# Patient Record
Sex: Male | Born: 1985 | Race: White | Hispanic: No | Marital: Single | Smoking: Current every day smoker
Health system: Southern US, Community
[De-identification: ages and names within clinical notes are randomized; demographics above are authoritative.]

## PROBLEM LIST (undated history)

## (undated) DIAGNOSIS — F1910 Other psychoactive substance abuse, uncomplicated: Secondary | ICD-10-CM

## (undated) DIAGNOSIS — R42 Dizziness and giddiness: Secondary | ICD-10-CM

## (undated) DIAGNOSIS — K859 Acute pancreatitis without necrosis or infection, unspecified: Secondary | ICD-10-CM

## (undated) DIAGNOSIS — I10 Essential (primary) hypertension: Secondary | ICD-10-CM

## (undated) DIAGNOSIS — F419 Anxiety disorder, unspecified: Secondary | ICD-10-CM

## (undated) DIAGNOSIS — E785 Hyperlipidemia, unspecified: Secondary | ICD-10-CM

## (undated) DIAGNOSIS — IMO0002 Reserved for concepts with insufficient information to code with codable children: Secondary | ICD-10-CM

## (undated) DIAGNOSIS — K219 Gastro-esophageal reflux disease without esophagitis: Secondary | ICD-10-CM

## (undated) HISTORY — DX: Hyperlipidemia, unspecified: E78.5

## (undated) HISTORY — DX: Dizziness and giddiness: R42

## (undated) HISTORY — DX: Other psychoactive substance abuse, uncomplicated: F19.10

## (undated) HISTORY — DX: Anxiety disorder, unspecified: F41.9

## (undated) HISTORY — DX: Gastro-esophageal reflux disease without esophagitis: K21.9

## (undated) HISTORY — PX: APPENDECTOMY: SHX54

---

## 2013-04-03 ENCOUNTER — Emergency Department (HOSPITAL_COMMUNITY)
Admission: EM | Admit: 2013-04-03 | Discharge: 2013-04-03 | Disposition: A | Discharge status: Home / Self Care | Payer: Self-pay | Attending: Emergency Medicine | Admitting: Emergency Medicine

## 2013-04-03 ENCOUNTER — Emergency Department (HOSPITAL_COMMUNITY): Payer: Self-pay

## 2013-04-03 ENCOUNTER — Encounter (HOSPITAL_COMMUNITY): Payer: Self-pay | Admitting: *Deleted

## 2013-04-03 DIAGNOSIS — R928 Other abnormal and inconclusive findings on diagnostic imaging of breast: Secondary | ICD-10-CM | POA: Insufficient documentation

## 2013-04-03 DIAGNOSIS — Z79899 Other long term (current) drug therapy: Secondary | ICD-10-CM | POA: Insufficient documentation

## 2013-04-03 DIAGNOSIS — Z8679 Personal history of other diseases of the circulatory system: Secondary | ICD-10-CM | POA: Insufficient documentation

## 2013-04-03 DIAGNOSIS — IMO0002 Reserved for concepts with insufficient information to code with codable children: Secondary | ICD-10-CM | POA: Insufficient documentation

## 2013-04-03 DIAGNOSIS — R51 Headache: Secondary | ICD-10-CM | POA: Insufficient documentation

## 2013-04-03 DIAGNOSIS — H53149 Visual discomfort, unspecified: Secondary | ICD-10-CM | POA: Insufficient documentation

## 2013-04-03 DIAGNOSIS — Y929 Unspecified place or not applicable: Secondary | ICD-10-CM | POA: Insufficient documentation

## 2013-04-03 DIAGNOSIS — Y939 Activity, unspecified: Secondary | ICD-10-CM | POA: Insufficient documentation

## 2013-04-03 DIAGNOSIS — L0231 Cutaneous abscess of buttock: Secondary | ICD-10-CM | POA: Insufficient documentation

## 2013-04-03 DIAGNOSIS — W268XXA Contact with other sharp object(s), not elsewhere classified, initial encounter: Secondary | ICD-10-CM | POA: Insufficient documentation

## 2013-04-03 DIAGNOSIS — R112 Nausea with vomiting, unspecified: Secondary | ICD-10-CM | POA: Insufficient documentation

## 2013-04-03 DIAGNOSIS — I10 Essential (primary) hypertension: Secondary | ICD-10-CM | POA: Insufficient documentation

## 2013-04-03 DIAGNOSIS — F17200 Nicotine dependence, unspecified, uncomplicated: Secondary | ICD-10-CM | POA: Insufficient documentation

## 2013-04-03 HISTORY — DX: Essential (primary) hypertension: I10

## 2013-04-03 MED ORDER — KETOROLAC TROMETHAMINE 60 MG/2ML IM SOLN
60.0000 mg | Freq: Once | INTRAMUSCULAR | Status: AC
  Administered 2013-04-03: 60 mg via INTRAMUSCULAR
  Filled 2013-04-03: qty 2

## 2013-04-03 MED ORDER — SODIUM CHLORIDE 0.9 % IV BOLUS (SEPSIS): 1000.0000 mL | Freq: Once | INTRAVENOUS | Status: DC

## 2013-04-03 MED ORDER — METOCLOPRAMIDE HCL 5 MG/ML IJ SOLN
10.0000 mg | Freq: Once | INTRAMUSCULAR | Status: DC
  Filled 2013-04-03: qty 2

## 2013-04-03 MED ORDER — DIPHENHYDRAMINE HCL 50 MG/ML IJ SOLN
INTRAMUSCULAR | Status: AC
  Filled 2013-04-03: qty 1

## 2013-04-03 MED ORDER — IBUPROFEN 800 MG PO TABS: 800.0000 mg | ORAL_TABLET | Freq: Three times a day (TID) | ORAL | Status: DC | PRN

## 2013-04-03 MED ORDER — DIPHENHYDRAMINE HCL 50 MG/ML IJ SOLN
25.0000 mg | Freq: Once | INTRAMUSCULAR | Status: AC
  Administered 2013-04-03: 25 mg via INTRAMUSCULAR

## 2013-04-03 MED ORDER — SUMATRIPTAN SUCCINATE 50 MG PO TABS: 50.0000 mg | ORAL_TABLET | ORAL | Status: DC | PRN

## 2013-04-03 MED ORDER — DIPHENHYDRAMINE HCL 50 MG/ML IJ SOLN
25.0000 mg | Freq: Once | INTRAMUSCULAR | Status: DC
  Filled 2013-04-03: qty 1

## 2013-04-03 MED ORDER — KETOROLAC TROMETHAMINE 30 MG/ML IJ SOLN
30.0000 mg | Freq: Once | INTRAMUSCULAR | Status: DC
  Filled 2013-04-03: qty 1

## 2013-04-03 MED ORDER — PROCHLORPERAZINE EDISYLATE 5 MG/ML IJ SOLN
10.0000 mg | Freq: Once | INTRAMUSCULAR | Status: AC
  Administered 2013-04-03: 10 mg via INTRAMUSCULAR
  Filled 2013-04-03: qty 2

## 2013-04-03 NOTE — ED Notes (Signed)
Pt is here with consistent headache for 2 weeks and runs from left eye to back of head.  BP 170/120 and family started giving him her blood pressure medicine and has lowered it.  Pt has lump to right peck area that is growing and hurts.  Sensitive to light

## 2013-04-03 NOTE — ED Notes (Signed)
Patient is alert and orientedx4.  Patient was explained discharge instructions and they understood them with no questions.  The patient's mom, Richard House is taking the patient home.

## 2013-04-03 NOTE — ED Provider Notes (Signed)
I saw and evaluated the patient, reviewed the resident's note and I agree with the findings and plan.  Please see my separate note regarding my evaluation of the patient.  Clinical Impression:  headache   Vida Roller, MD 04/03/13 [phone]

## 2013-04-03 NOTE — ED Provider Notes (Signed)
CSN: [id number]     Arrival date & time 04/03/13  1048 History     First MD Initiated Contact with Patient 04/03/13 1115     Chief Complaint  Patient presents with  . Headache   (Consider location/radiation/quality/duration/timing/severity/associated sxs/prior Treatment) Patient is a 27 y.o. male presenting with headaches.  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  He presents to the ED today for a headache that has been ongoing for the past 2 weeks.  He reports currently his HA is currently located behind his left eye and it radiates to the back of his head.  He reports before this morning he had a B/L occipital headache.  He reports his headache is currently a 7-8/10.  He admits a history of migraine HA.  He reports associated nausea and one episode of NBNB emesis this morning.  He also reports photophobia and phonophobia. He is accompanied by his mother who reports that he has been in [place] for the past month to try get into a methadone detox program.  He has started to decrease his methadone use in the past 2 weeks.  In addition his mother has been monitoring his BP, she reports that 2 weeks ago she noticed that his BP was 170/120 and started to give him her BP medication Lotrel 5/10 in the morning and at night.  She has continued to monitor his BP.  He reports he does not have a PCP or healthcare provider. He is also concerned about nodule under left breast, recurrent cyst that drains occasionally, and cyst on tailbone that also occasionally drains.  Past Medical History  Diagnosis Date  . Hypertension    History reviewed. No  pertinent past surgical history. No family history on file. History  Substance Use Topics  . Smoking status: Current Every Day Smoker  . Smokeless tobacco: Not on file  . Alcohol Use: No    Review of Systems  Constitutional: Negative for fever, chills and activity change.  HENT: Negative for congestion, neck pain, neck stiffness and sinus pressure.   Eyes: Positive for photophobia. Negative for pain and visual disturbance.  Respiratory: Negative for cough, chest tightness and shortness of breath.   Cardiovascular: Negative for chest pain, palpitations and leg swelling.  Gastrointestinal: Positive for nausea and vomiting. Negative for abdominal pain, diarrhea and constipation.  Endocrine: Negative for polydipsia and polyuria.  Genitourinary: Negative for dysuria, frequency, hematuria and difficulty urinating.  Musculoskeletal: Negative for myalgias, back pain and arthralgias.  Skin: Positive for wound (scratch on right chest).  Neurological: Positive for headaches. Negative for dizziness, seizures, facial asymmetry, speech difficulty, weakness, light-headedness and numbness.  Psychiatric/Behavioral: Negative for confusion.    Allergies  Review of patient's allergies indicates no known allergies.  Home Medications   Current Outpatient Rx  Name  Route  Sig  Dispense  Refill  . amLODipine-benazepril (LOTREL) 5-10 MG per capsule   Oral   Take 1 capsule by mouth daily.         . methadone (DOLOPHINE) 10 MG/ML solution   Oral   Take 90 mg by mouth daily.         Marland Kitchen  Multiple Vitamin (MULTIVITAMIN WITH MINERALS) TABS tablet   Oral   Take 1 tablet by mouth daily.          BP 112/81  Pulse 84  Temp(Src) 98.4 F (36.9 C) (Oral)  Resp 18  SpO2 97% Physical Exam  Nursing note and vitals reviewed. Constitutional: He is oriented to person, place, and time. He appears well-developed and well-nourished. No distress.  HENT:  Head: Normocephalic and atraumatic.  Eyes: EOM are  normal.  Neck: Normal range of motion. Neck supple.  Cardiovascular: Normal rate, regular rhythm and normal heart sounds.   No murmur heard. Pulmonary/Chest: Effort normal and breath sounds normal. No respiratory distress. He has no wheezes. He has no rales. He exhibits tenderness (right nipple over 1cm cyst).  1cm tender nodule under left nipple, no surrounding erythema.  Abdominal: Soft. Bowel sounds are normal. He exhibits no distension. There is no tenderness. There is no rebound.  Musculoskeletal: He exhibits no edema.  Neurological: He is alert and oriented to person, place, and time. No cranial nerve deficit.  Skin: He is not diaphoretic.  Small <1cm cyst in left groin, not draining. Small tract present at base of sacrum, no cyst palpated, no drainage noted.    ED Course   Procedures (including critical care time)  Labs Reviewed - No data to display Ct Head Wo Contrast  04/03/2013   *RADIOLOGY REPORT*  Clinical Data: Persistent headaches with nausea and photophobia.  CT HEAD WITHOUT CONTRAST  Technique:  Contiguous axial images were obtained from the base of the skull through the vertex without contrast.  Comparison: None.  Findings: No evidence of acute infarct, acute hemorrhage, mass lesion, mass effect or hydrocephalus.  Visualized portions of the paranasal sinuses and mastoid air cells are clear.  IMPRESSION: Negative.   Original Report Authenticated By: Leanna Battles, M.D.   1. Headache     MDM  27 year old male with PMH of heroine abuse currently dextoxing from methadone who may have history of longstanding HTN, that was previous untreated.  Patient has experienced 2 weeks of HA that are consistent with migraine type HA, he has no nuerologic deficits, and his HA have conincided with metadone withdraw and also sudden addition of BP medication from his mother.   He does not describe these HA as sudden onset, not worst HA of his life, and have not gradually increased over  months.  He reports no neck stiffniss or pain, has no fever, I do not believe a LP is warranted at this time. Will treat symptoms with IV Toradol, reglan, and benadryl, 1L NS bolus and reevaluate. CT-head negative for acute process. Patient feels much better after medication.  Will discharge with prescriptions for Ibuprofen and Imitrex for likely migraine HA.  Patient instructed to follow up with Neurology.  Patient instructed to follow up with General Surgery for cyst, instructed to follow up with [facility] for tender nodule under left breast.  Given return precautions.  Given resource guide for finding PCP, told not to take mother's BP medication and should establish with PCP within the next few days to establish care and evaluate for HTN.  [name], DO 04/03/13 1457

## 2013-04-03 NOTE — ED Notes (Signed)
[facility] card application was given to establish Primary Care.

## 2013-04-03 NOTE — ED Provider Notes (Addendum)
27 year old male with a history of headaches intermittently but gradually worsening over the last several weeks. He is currently on methadone taper because of his chronic heroin use. He has been on methadone for the last year and a half successfully, has tapered from 95 mg a day to 85 mg a day. He does not feel like he is having any other withdrawal symptoms. The headaches are throbbing, left-sided, right-sided, not associated with focal neuro deficits fevers or stiff neck. On exam he has a normal neurologic exam including a clear sensorium, normal cranial nerves III through 12, normal gait, normal speech. He has normal pupillary exam, no sinus tenderness clear oropharynx. He does not have reproducible tenderness on exam. The patient likely has a migraine-type headache, he can be treated with medications in a cocktail format including Compazine, Toradol, Benadryl if available. He is amenable to followup. He also has a incidental mass in his right breast which has not been evaluated and for which she will need referral to the breast center.   Clinical Impression:   Headache Breast mass  Vida Roller, MD 04/03/13 1242  Vida Roller, MD 04/03/13 [phone]

## 2013-04-04 ENCOUNTER — Telehealth (HOSPITAL_COMMUNITY): Payer: Self-pay | Admitting: Emergency Medicine

## 2013-04-04 NOTE — ED Notes (Signed)
Rcvd call from pt's mother regarding scheduling mammogram for son.  Spoke w/Jennifer at [facility] was told need orders because pt has a problem.  Need IMG 2644 B diagnostic mammogram and IMG 523 Rt breast ultrasound for breast cyst.  Orders placed by Dr Jinger Neighbors.  [facility] and pt's mom notified orders were in.  Result to come to Dr Shela Commons. Jonn Shingles in box.

## 2013-04-17 ENCOUNTER — Ambulatory Visit
Admission: RE | Admit: 2013-04-17 | Discharge: 2013-04-17 | Disposition: A | Discharge status: Home / Self Care | Payer: No Typology Code available for payment source | Source: Ambulatory Visit | Attending: Emergency Medicine | Admitting: Emergency Medicine

## 2013-04-18 ENCOUNTER — Encounter: Payer: Self-pay | Admitting: Internal Medicine

## 2013-04-18 ENCOUNTER — Ambulatory Visit: Payer: No Typology Code available for payment source | Attending: Internal Medicine | Admitting: Internal Medicine

## 2013-04-18 VITALS — BP 144/96 | HR 74 | Temp 98.4°F | Resp 18 | Ht 72.0 in | Wt 223.0 lb

## 2013-04-18 DIAGNOSIS — F1121 Opioid dependence, in remission: Secondary | ICD-10-CM | POA: Insufficient documentation

## 2013-04-18 DIAGNOSIS — R42 Dizziness and giddiness: Secondary | ICD-10-CM | POA: Insufficient documentation

## 2013-04-18 DIAGNOSIS — I10 Essential (primary) hypertension: Secondary | ICD-10-CM

## 2013-04-18 DIAGNOSIS — R110 Nausea: Secondary | ICD-10-CM

## 2013-04-18 DIAGNOSIS — Z79899 Other long term (current) drug therapy: Secondary | ICD-10-CM | POA: Insufficient documentation

## 2013-04-18 MED ORDER — AMLODIPINE BESY-BENAZEPRIL HCL 5-10 MG PO CAPS: 1.0000 | ORAL_CAPSULE | Freq: Every day | ORAL | Status: DC

## 2013-04-18 MED ORDER — MECLIZINE HCL 32 MG PO TABS: 32.0000 mg | ORAL_TABLET | Freq: Three times a day (TID) | ORAL | Status: DC | PRN

## 2013-04-18 MED ORDER — ONDANSETRON HCL 4 MG PO TABS: 4.0000 mg | ORAL_TABLET | Freq: Three times a day (TID) | ORAL | Status: DC | PRN

## 2013-04-18 MED ORDER — CLONIDINE HCL 0.1 MG PO TABS: 0.1000 mg | ORAL_TABLET | Freq: Two times a day (BID) | ORAL | Status: DC

## 2013-04-18 NOTE — Progress Notes (Signed)
Patient Demographics  Richard House, is a 27 y.o. male  WUJ:[id number]  NFA:[id number]  DOB - 1986-03-15  Chief Complaint  Patient presents with  . Establish Care  . Hypertension  . Drug Problem    sober heroin use x 1 yr on methadone        Subjective:   Richard House today is here to establish primary care. He recently moved to [facility] from [place] He has been on Methadone for almost 2 years back,previously used to use IV Heroin-clean for more than one year per patient Methadone has started to be slowly tapered down, he requests that he be started on Clonidine, his methadone clinic apparently wanted him to be on it. He claims that for the past 2 weeks that he has been having lightheadedness, vertigo and nausea. He thinks that he has a right ear infection. Denies any fever or discharge from the right ear. He has been taking his mother's Amlodipine and Benazepril to control his BP-recently it was as high as 190's systolic, but has now gotten it down to 140's systolic after taking his mother's pills. He recently was seen in the ED for a migraine headache, a CT Head during that visit was neg for acute abnormalities  Patient has also has No headache, No chest pain, No abdominal pain,No Nausea, No new weakness tingling or numbness, No Cough or SOB. *  Objective:    Filed Vitals:   04/18/13 1536  BP: 144/96  Pulse: 74  Temp: 98.4 F (36.9 C)  TempSrc: Oral  Resp: 18  Height: 6' (1.829 m)  Weight: 223 lb (101.152 kg)  SpO2: 97%     ALLERGIES:  No Known Allergies  PAST MEDICAL HISTORY: Past Medical History  Diagnosis Date  . Hypertension     PAST SURGICAL HISTORY: No past surgical history on file.  FAMILY HISTORY: No family history on file.  MEDICATIONS AT HOME: Prior to Admission medications   Medication Sig Start Date End Date Taking? Authorizing Provider  methadone (DOLOPHINE) 10 MG/ML solution Take 90 mg by mouth daily.   Yes Historical Provider, MD   amLODipine-benazepril (LOTREL) 5-10 MG per capsule Take 1 capsule by mouth daily. 04/18/13   Nasha Diss Levora Dredge, MD  cloNIDine (CATAPRES) 0.1 MG tablet Take 1 tablet (0.1 mg total) by mouth 2 (two) times daily. 04/18/13   Krystina Strieter Levora Dredge, MD  ibuprofen (ADVIL,MOTRIN) 800 MG tablet Take 1 tablet (800 mg total) by mouth every 8 (eight) hours as needed for pain. 04/03/13   [name], DO  meclizine (ANTIVERT) 32 MG tablet Take 1 tablet (32 mg total) by mouth 3 (three) times daily as needed for dizziness or nausea. 04/18/13   Dericka Ostenson Levora Dredge, MD  Multiple Vitamin (MULTIVITAMIN WITH MINERALS) TABS tablet Take 1 tablet by mouth daily.    Historical Provider, MD  ondansetron (ZOFRAN) 4 MG tablet Take 1 tablet (4 mg total) by mouth every 8 (eight) hours as needed for nausea. 04/18/13   Troyce Gieske Levora Dredge, MD  SUMAtriptan (IMITREX) 50 MG tablet Take 1 tablet (50 mg total) by mouth every 2 (two) hours as needed for migraine. Not to exceed 200mg  total (4 tablets) in 24 hour period. 04/03/13   [name], DO    SOCIAL HISTORY:   reports that he has been smoking.  He does not have any smokeless tobacco history on file. He reports that he uses illicit drugs. He reports that he does not drink alcohol.  REVIEW OF SYSTEMS:  Constitutional:  No   Fevers, chills, fatigue.  HEENT:    No  Sore throat,   Cardio-vascular: No chest pain,  Orthopnea, swelling in lower extremities, anasarca, palpitations  GI:  No abdominal pain,  vomiting, diarrhea  Resp: No shortness of breath,  No coughing up of blood.No cough.No wheezing.  Skin:  no rash or lesions.  GU:  no dysuria, change in color of urine, no urgency or frequency.  No flank pain.  Musculoskeletal: No joint pain or swelling.  No decreased range of motion.  No back pain.  Psych: No change in mood or affect. No depression or anxiety.  No memory loss.   Exam  General appearance :Awake, alert, not in any distress. Speech Clear. Not toxic  Looking HEENT: Atraumatic and Normocephalic, pupils equally reactive to light and accomodation. Tympanic membranes clearly visible, no signs of otitis externa on exam Neck: supple, no JVD. No cervical lymphadenopathy.  Chest:Good air entry bilaterally, no added sounds  CVS: S1 S2 regular, no murmurs.  Abdomen: Bowel sounds present, Non tender and not distended with no gaurding, rigidity or rebound. Extremities: B/L Lower Ext shows no edema, both legs are warm to touch Neurology: Awake alert, and oriented X 3, CN II-XII intact, Non focal Skin:No Rash Wounds:N/A    Data Review   CBC No results found for this basename: WBC, HGB, HCT, PLT, MCV, MCH, MCHC, RDW, NEUTRABS, LYMPHSABS, MONOABS, EOSABS, BASOSABS, BANDABS, BANDSABD,  in the last 168 hours  Chemistries   No results found for this basename: NA, K, CL, CO2, GLUCOSE, BUN, CREATININE, GFRCGP, CALCIUM, MG, AST, ALT, ALKPHOS, BILITOT,  in the last 168 hours ------------------------------------------------------------------------------------------------------------------ No results found for this basename: HGBA1C,  in the last 72 hours ------------------------------------------------------------------------------------------------------------------ No results found for this basename: CHOL, HDL, LDLCALC, TRIG, CHOLHDL, LDLDIRECT,  in the last 72 hours ------------------------------------------------------------------------------------------------------------------ No results found for this basename: TSH, T4TOTAL, FREET3, T3FREE, THYROIDAB,  in the last 72 hours ------------------------------------------------------------------------------------------------------------------ No results found for this basename: VITAMINB12, FOLATE, FERRITIN, TIBC, IRON, RETICCTPCT,  in the last 72 hours  Coagulation profile  No results found for this basename: INR, PROTIME,  in the last 168 hours    Assessment & Plan   Uncontrolled HTN -c/w  Amlodipine/Benazepril -will add Clonidine 0.1 mg BID-may help with his other symptoms  Dizziness/Vertigo/Sweating/Nausea -?from uncontrolled HTN or from him being tapered off Methadone -trial of Clonidine -recent CT Head neg is reassuring -no fever-neck is supple -need to check EKG/Orthostatics -need to get basic labs  Chronic Methadone User -methadone per his methadone clinic  Plan for today -EKG/Orthostatics/lab work ordered-needs follow up in 1 week  Above plan was discussed with patient in great detail-he is agreeable  Follow up in 7 days  The patient was given clear instructions to go to ER or return to medical center if symptoms don't improve, worsen or new problems develop. The patient verbalized understanding. The patient was told to call to get lab results if they haven't heard anything in the next week.

## 2013-04-18 NOTE — Progress Notes (Signed)
Pt here to establish care for hx HTN BEING SEEN AT [facility] CL[facility]IC FOR FORMER HEROINE USE X1 YR CLEAN WART NOTED TO RIGHT THUMD AREA

## 2013-04-24 ENCOUNTER — Ambulatory Visit: Payer: No Typology Code available for payment source | Attending: Internal Medicine

## 2013-04-24 DIAGNOSIS — R42 Dizziness and giddiness: Secondary | ICD-10-CM

## 2013-04-24 LAB — CBC
MCV: 89.1 fL (ref 78.0–100.0)
Platelets: 261 10*3/uL (ref 150–400)
RBC: 5.22 MIL/uL (ref 4.22–5.81)
WBC: 8.5 10*3/uL (ref 4.0–10.5)

## 2013-04-24 LAB — LIPID PANEL
Cholesterol: 201 mg/dL — ABNORMAL HIGH (ref 0–200)
HDL: 55 mg/dL (ref >39)
Total CHOL/HDL Ratio: 3.7 Ratio
Triglycerides: 276 mg/dL — ABNORMAL HIGH (ref <150)

## 2013-04-24 LAB — TSH: TSH: 3.39 u[IU]/mL (ref 0.350–4.500)

## 2013-04-27 ENCOUNTER — Encounter: Payer: Self-pay | Admitting: Internal Medicine

## 2013-04-27 ENCOUNTER — Ambulatory Visit: Payer: No Typology Code available for payment source | Attending: Internal Medicine | Admitting: Internal Medicine

## 2013-04-27 VITALS — BP 144/93 | HR 78 | Temp 98.5°F | Resp 16 | Ht 70.47 in | Wt 229.0 lb

## 2013-04-27 DIAGNOSIS — I10 Essential (primary) hypertension: Secondary | ICD-10-CM | POA: Insufficient documentation

## 2013-04-27 DIAGNOSIS — H8110 Benign paroxysmal vertigo, unspecified ear: Secondary | ICD-10-CM | POA: Insufficient documentation

## 2013-04-27 MED ORDER — AMLODIPINE BESYLATE 5 MG PO TABS: 5.0000 mg | ORAL_TABLET | Freq: Every day | ORAL | Status: DC

## 2013-04-27 MED ORDER — MECLIZINE HCL 25 MG PO TABS: 25.0000 mg | ORAL_TABLET | Freq: Three times a day (TID) | ORAL | Status: DC | PRN

## 2013-04-27 MED ORDER — LISINOPRIL 10 MG PO TABS: 10.0000 mg | ORAL_TABLET | Freq: Every day | ORAL | Status: DC

## 2013-04-27 NOTE — Progress Notes (Signed)
Pt is here for a F/U visit. Pt is here to follow up on his hypertension which is getting better. Pt reports that he is constantly dizzy. Also concerned about his breast are getting larger.

## 2013-04-27 NOTE — Progress Notes (Signed)
Patient ID: Richard House, male   DOB: September 22, 1985, 27 y.o.   MRN: [id number] Patient Demographics  Richard House, is a 27 y.o. male  WUJ:[id number]  NFA:[id number]  DOB - June 10, 1986  Chief Complaint  Patient presents with  . Follow-up        Subjective:   Richard House is a 27 y.o. male here today for a follow up visit. He still feels dizzy. He told not to fill this prescription amlodipine-benazepril because it was not covered and he could not afford it. Patient also had trouble getting the meclizine prescribed because it was 32 mg which is not available in the pharmacy. His blood pressure at home has always been above 120/80, usually around 135/85 mmHg, no evidence of hypotension. Patient still on methadone.  Patient has No headache, No chest pain, No abdominal pain - No Nausea, No new weakness tingling or numbness, No Cough - SOB.  ALLERGIES:  No Known Allergies  PAST MEDICAL HISTORY: Past Medical History  Diagnosis Date  . Hypertension     MEDICATIONS AT HOME: Prior to Admission medications   Medication Sig Start Date End Date Taking? Authorizing Provider  cloNIDine (CATAPRES) 0.1 MG tablet Take 1 tablet (0.1 mg total) by mouth 2 (two) times daily. 04/18/13  Yes Shanker Levora Dredge, MD  methadone (DOLOPHINE) 10 MG/ML solution Take 90 mg by mouth daily.   Yes Historical Provider, MD  Multiple Vitamin (MULTIVITAMIN WITH MINERALS) TABS tablet Take 1 tablet by mouth daily.   Yes Historical Provider, MD  SUMAtriptan (IMITREX) 50 MG tablet Take 1 tablet (50 mg total) by mouth every 2 (two) hours as needed for migraine. Not to exceed 200mg  total (4 tablets) in 24 hour period. 04/03/13  Yes [name], DO  amLODipine (NORVASC) 5 MG tablet Take 1 tablet (5 mg total) by mouth daily. 04/27/13   [name], MD  amLODipine-benazepril (LOTREL) 5-10 MG per capsule Take 1 capsule by mouth daily. 04/18/13   Shanker Levora Dredge, MD  ibuprofen (ADVIL,MOTRIN) 800 MG tablet Take 1 tablet (800 mg  total) by mouth every 8 (eight) hours as needed for pain. 04/03/13   [name], DO  lisinopril (PRINIVIL,ZESTRIL) 10 MG tablet Take 1 tablet (10 mg total) by mouth daily. 04/27/13   [name], MD  meclizine (ANTIVERT) 25 MG tablet Take 1 tablet (25 mg total) by mouth 3 (three) times daily as needed for dizziness or nausea. 04/27/13   [name], MD  ondansetron (ZOFRAN) 4 MG tablet Take 1 tablet (4 mg total) by mouth every 8 (eight) hours as needed for nausea. 04/18/13   Shanker Levora Dredge, MD     Objective:   Filed Vitals:   04/27/13 1449  BP: 144/93  Pulse: 78  Temp: 98.5 F (36.9 C)  TempSrc: Oral  Resp: 16  Height: 5' 10.47" (1.79 m)  Weight: 229 lb (103.874 kg)  SpO2: 99%    Exam General appearance :Awake, alert, not in any distress. Speech Clear. Not toxic Looking HEENT: Atraumatic and Normocephalic, pupils equally reactive to light and accomodation Neck: supple, no JVD. No cervical lymphadenopathy.  Chest:Good air entry bilaterally, no added sounds  CVS: S1 S2 regular, no murmurs.  Abdomen: Bowel sounds present, Non tender and not distended with no gaurding, rigidity or rebound. Extremities: B/L Lower Ext shows no edema, both legs are warm to touch Neurology: Awake alert, and oriented X 3, CN II-XII intact, Non focal Skin:No Rash Wounds:N/A   Data Review   CBC  Recent Labs Lab  04/24/13 1019  WBC 8.5  HGB 16.8  HCT 46.5  PLT 261  MCV 89.1  MCH 32.2  MCHC 36.1*  RDW 14.7    Chemistries   No results found for this basename: NA, K, CL, CO2, GLUCOSE, BUN, CREATININE, GFRCGP, CALCIUM, MG, AST, ALT, ALKPHOS, BILITOT,  in the last 168 hours ------------------------------------------------------------------------------------------------------------------ No results found for this basename: HGBA1C,  in the last 72 hours ------------------------------------------------------------------------------------------------------------------ No results found  for this basename: CHOL, HDL, LDLCALC, TRIG, CHOLHDL, LDLDIRECT,  in the last 72 hours ------------------------------------------------------------------------------------------------------------------ No results found for this basename: TSH, T4TOTAL, FREET3, T3FREE, THYROIDAB,  in the last 72 hours ------------------------------------------------------------------------------------------------------------------ No results found for this basename: VITAMINB12, FOLATE, FERRITIN, TIBC, IRON, RETICCTPCT,  in the last 72 hours  Coagulation profile  No results found for this basename: INR, PROTIME,  in the last 168 hours    Assessment & Plan   Patient Active Problem List   Diagnosis Date Noted  . Essential hypertension, benign 04/27/2013  . Benign paroxysmal positional vertigo 04/27/2013     Plan: Explained to patient that the mild gynecomastia is a direct side effect of methadone especially that he is on a very high dose though now being tapered off Patient was extensively counseled about nutrition and exercise   Meclizine dose changed to 25 mg tablet by mouth 3 times a day  Amlodipine/benazepril 5-10 milligrams capsule changed to the following because patient could not afford this  Amlodipine 5 mg tablet by mouth daily  Lisinopril 10 mg tablet by mouth daily  Patient counseled about smoking cessation  Follow up in 2 months  The patient was given clear instructions to go to ER or return to medical center if symptoms don't improve, worsen or new problems develop. The patient verbalized understanding. The patient was told to call to get lab results if they haven't heard anything in the next week.    [name], MD, MHA, FACP [facility] [place], [place] [phone]   04/27/2013, 3:23 PM

## 2013-05-02 ENCOUNTER — Encounter: Payer: Self-pay | Admitting: Internal Medicine

## 2013-05-02 ENCOUNTER — Ambulatory Visit: Payer: No Typology Code available for payment source | Attending: Family Medicine | Admitting: Internal Medicine

## 2013-05-02 VITALS — BP 116/83 | HR 90 | Temp 98.3°F | Resp 16 | Ht 72.0 in | Wt 232.0 lb

## 2013-05-02 DIAGNOSIS — H8110 Benign paroxysmal vertigo, unspecified ear: Secondary | ICD-10-CM

## 2013-05-02 DIAGNOSIS — E781 Pure hyperglyceridemia: Secondary | ICD-10-CM

## 2013-05-02 DIAGNOSIS — Z23 Encounter for immunization: Secondary | ICD-10-CM

## 2013-05-02 DIAGNOSIS — I10 Essential (primary) hypertension: Secondary | ICD-10-CM

## 2013-05-02 DIAGNOSIS — Z0000 Encounter for general adult medical examination without abnormal findings: Secondary | ICD-10-CM

## 2013-05-02 DIAGNOSIS — N62 Hypertrophy of breast: Secondary | ICD-10-CM | POA: Insufficient documentation

## 2013-05-02 MED ORDER — ONDANSETRON HCL 4 MG PO TABS: 4.0000 mg | ORAL_TABLET | Freq: Three times a day (TID) | ORAL | Status: DC | PRN

## 2013-05-02 MED ORDER — FISH OIL 1000 MG PO CAPS: 1000.0000 mg | ORAL_CAPSULE | Freq: Two times a day (BID) | ORAL | Status: DC

## 2013-05-02 NOTE — Progress Notes (Signed)
PT HERE WITH C/O DIZZINESS WITH NAUSEA X 1 MNTH PT WAS SEEN HERE AND PRESCRIBED MECLIZINE. STATES NOT WORKING NAUSEA-RECENT FILLED ZOFRAN

## 2013-05-02 NOTE — Progress Notes (Signed)
PT HERE WITH C/O DIZZINESS X 1 MNTH UNRELIEVED WITH PRESCRIBED MECLIZINE.  NAUSEA NOTED. PT PICKED ZOFRAN UP TODAY ORTHO STATS NEG

## 2013-05-02 NOTE — Progress Notes (Signed)
Patient ID: Richard House, male   DOB: [birth year], 27 y.o.   MRN: [id number]   CC: Dizziness and nausea for one month.  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 During that visit, he was put on meclizine. He was also put on amlodipine 5 mg daily and lisinopril 10 mg daily. Blood work done on 04/24/2013 showed an nonreactive HIV test. TSH was normal at 3.390. Hemoglobin A1c was 5.6%. Lipid panel showed a cholesterol of 201, triglycerides 276, HDL 55, LDL 91. CBC was unremarkable. The patient continues to have dizziness with a sense of motion. He gets nauseated and sometimes vomits with these episodes which have been going on for the past month and half. His maternal grandmother suffered with similar symptoms. He has not had any recent upper respiratory symptoms or herpes-type sores. Antivert takes the edge off but does not alleviate his symptoms.  No Known Allergies Past Medical History  Diagnosis Date  . Hypertension    Current Outpatient Prescriptions on File Prior to Visit  Medication Sig Dispense Refill  . amLODipine-benazepril (LOTREL) 5-10 MG per capsule Take 1 capsule by mouth daily.  30 capsule  2  . cloNIDine (CATAPRES) 0.1 MG tablet Take 1 tablet (0.1 mg total) by mouth 2 (two) times daily.  60 tablet  3  . lisinopril (PRINIVIL,ZESTRIL) 10 MG tablet Take 1 tablet (10 mg total) by mouth daily.  90 tablet  3  . meclizine (ANTIVERT) 25 MG tablet Take 1 tablet (25 mg total) by mouth 3 (three) times daily as needed for dizziness or nausea.  30 tablet  2  . methadone (DOLOPHINE) 10 MG/ML solution Take 90 mg by mouth daily.      . Multiple Vitamin (MULTIVITAMIN WITH MINERALS) TABS tablet Take 1 tablet by mouth daily.      Marland Kitchen amLODipine (NORVASC) 5 MG tablet Take 1  tablet (5 mg total) by mouth daily.  90 tablet  3  . ibuprofen (ADVIL,MOTRIN) 800 MG tablet Take 1 tablet (800 mg total) by mouth every 8 (eight) hours as needed for pain.  30 tablet  0  . SUMAtriptan (IMITREX) 50 MG tablet Take 1 tablet (50 mg total) by mouth every 2 (two) hours as needed for migraine. Not to exceed 200mg  total (4 tablets) in 24 hour period.  10 tablet  0   No current facility-administered medications on file prior to visit.   History reviewed. No pertinent family history. History   Social History  . Marital Status: Single    Spouse Name: N/A    Number of Children: N/A  . Years of Education: N/A   Occupational History  . Not on file.   Social History Main Topics  . Smoking status: Current Every Day Smoker  . Smokeless tobacco: Not on file  . Alcohol Use: No  . Drug Use: Yes     Comment: hx of drug use and takes methadone  . Sexual Activity: Not on file   Other Topics Concern  . Not on file   Social History Narrative  . No narrative on file    Review of Systems: Constitutional: No fever, no chills;  Appetite normal; No weight loss, + weight gain. HEENT: No blurry vision, no diplopia, no pharyngitis, no dysphagia CV: No chest pain, no  palpitations.  Resp: No SOB, no cough. GI: No N/V, no diarrhea, no melena, no hematochezia.  GU: No dysuria, hematuria, no frequency, no hesitancy.  MSK: no myalgias/arthralgias.  Neuro:  + occasional dull headache, no focal neurological deficits, + vertigo.  Psych: No depression, no anxiety.  Endo: + heat intolerance, no cold intolerance, no excessive thirst, no excessive urination.  Skin: No rashes, no skin lesions.  Heme: No fatigue, no easy bruising   Objective:   Filed Vitals:   05/02/13 1232  BP: 116/83  Pulse: 90  Temp: 98.3 F (36.8 C)  Resp: 16    Physical Exam  Constitutional: Appears well-developed and well-nourished. No distress.  HENT: Normocephalic. External right and left ear normal. Oropharynx is clear  and moist.  Eyes: Conjunctivae and EOM are normal. PERRLA, no scleral icterus.  Neck: Normal ROM. Neck supple. No JVD. No tracheal deviation. No thyromegaly.  CVS: RRR, S1/S2 +, no murmurs, no gallops, no carotid bruit.  Pulmonary: Effort and breath sounds normal, no stridor, rhonchi, wheezes, rales.  Abdominal: Soft. BS +,  no distension, tenderness, rebound or guarding. Musculoskeletal: Normal range of motion. No edema and no tenderness.  Neuro: Alert. Normal reflexes, muscle tone coordination. No cranial nerve deficit. Skin: Skin is warm and dry. No rash noted. Not diaphoretic. No erythema. No pallor.  Psychiatric: Normal mood and affect. Behavior, judgment, thought content normal.   Lab Results  Component Value Date   WBC 8.5 04/24/2013   HGB 16.8 04/24/2013   HCT 46.5 04/24/2013   MCV 89.1 04/24/2013   PLT 261 04/24/2013   No results found for this basename: CREATININE,  BUN,  NA,  K,  CL,  CO2    Lab Results  Component Value Date   HGBA1C 5.6 04/24/2013   Lipid Panel     Component Value Date/Time   CHOL 201* 04/24/2013 1019   TRIG 276* 04/24/2013 1019   HDL 55 04/24/2013 1019   CHOLHDL 3.7 04/24/2013 1019   VLDL 55* 04/24/2013 1019   LDLCALC 91 04/24/2013 1019       Assessment and plan:  1. Benign paroxysmal positional vertigo: Continue Antivert and Zofran for nausea. Refills for Zofran given. Referral to vestibular physical therapy for further treatment. 2. Hypertriglyceridemia: Patient was instructed to take a fish oil supplement twice a day and was given written instructions on a low fat diet. 3. Hypertension: Currently controlled on medication. 4. Routine health maintenance: Flu vaccination given.  Return to the clinic:   Signed:  Dr. Trula Ore Artist Bloom 05/02/2013 1:12 PM

## 2013-05-02 NOTE — Patient Instructions (Signed)
Hypertriglyceridemia  Diet for High blood levels of Triglycerides Most fats in food are triglycerides. Triglycerides in your blood are stored as fat in your body. High levels of triglycerides in your blood may put you at a greater risk for heart disease and stroke.  Normal triglyceride levels are less than 150 mg/dL. Borderline high levels are 150-199 mg/dl. High levels are 200 - 499 mg/dL, and very high triglyceride levels are greater than 500 mg/dL. The decision to treat high triglycerides is generally based on the level. For people with borderline or high triglyceride levels, treatment includes weight loss and exercise. Drugs are recommended for people with very high triglyceride levels. Many people who need treatment for high triglyceride levels have metabolic syndrome. This syndrome is a collection of disorders that often include: insulin resistance, high blood pressure, blood clotting problems, high cholesterol and triglycerides. TESTING PROCEDURE FOR TRIGLYCERIDES  You should not eat 4 hours before getting your triglycerides measured. The normal range of triglycerides is between 10 and 250 milligrams per deciliter (mg/dl). Some people may have extreme levels (1000 or above), but your triglyceride level may be too high if it is above 150 mg/dl, depending on what other risk factors you have for heart disease.  People with high blood triglycerides may also have high blood cholesterol levels. If you have high blood cholesterol as well as high blood triglycerides, your risk for heart disease is probably greater than if you only had high triglycerides. High blood cholesterol is one of the main risk factors for heart disease. CHANGING YOUR DIET  Your weight can affect your blood triglyceride level. If you are more than 20% above your ideal body weight, you may be able to lower your blood triglycerides by losing weight. Eating less and exercising regularly is the best way to combat this. Fat provides more  calories than any other food. The best way to lose weight is to eat less fat. Only 30% of your total calories should come from fat. Less than 7% of your diet should come from saturated fat. A diet low in fat and saturated fat is the same as a diet to decrease blood cholesterol. By eating a diet lower in fat, you may lose weight, lower your blood cholesterol, and lower your blood triglyceride level.  Eating a diet low in fat, especially saturated fat, may also help you lower your blood triglyceride level. Ask your dietitian to help you figure how much fat you can eat based on the number of calories your caregiver has prescribed for you.  Exercise, in addition to helping with weight loss may also help lower triglyceride levels.   Alcohol can increase blood triglycerides. You may need to stop drinking alcoholic beverages.  Too much carbohydrate in your diet may also increase your blood triglycerides. Some complex carbohydrates are necessary in your diet. These may include bread, rice, potatoes, other starchy vegetables and cereals.  Reduce "simple" carbohydrates. These may include pure sugars, candy, honey, and jelly without losing other nutrients. If you have the kind of high blood triglycerides that is affected by the amount of carbohydrates in your diet, you will need to eat less sugar and less high-sugar foods. Your caregiver can help you with this.  Adding 2-4 grams of fish oil (EPA+ DHA) may also help lower triglycerides. Speak with your caregiver before adding any supplements to your regimen. Following the Diet  Maintain your ideal weight. Your caregivers can help you with a diet. Generally, eating less food and getting more   exercise will help you lose weight. Joining a weight control group may also help. Ask your caregivers for a good weight control group in your area.  Eat low-fat foods instead of high-fat foods. This can help you lose weight too.  These foods are lower in fat. Eat MORE of these:    Dried beans, peas, and lentils.  Egg whites.  Low-fat cottage cheese.  Fish.  Lean cuts of meat, such as round, sirloin, rump, and flank (cut extra fat off meat you fix).  Whole grain breads, cereals and pasta.  Skim and nonfat dry milk.  Low-fat yogurt.  Poultry without the skin.  Cheese made with skim or part-skim milk, such as mozzarella, parmesan, farmers', ricotta, or pot cheese. These are higher fat foods. Eat LESS of these:   Whole milk and foods made from whole milk, such as American, blue, cheddar, monterey jack, and swiss cheese  High-fat meats, such as luncheon meats, sausages, knockwurst, bratwurst, hot dogs, ribs, corned beef, ground pork, and regular ground beef.  Fried foods. Limit saturated fats in your diet. Substituting unsaturated fat for saturated fat may decrease your blood triglyceride level. You will need to read package labels to know which products contain saturated fats.  These foods are high in saturated fat. Eat LESS of these:   Fried pork skins.  Whole milk.  Skin and fat from poultry.  Palm oil.  Butter.  Shortening.  Cream cheese.  Bacon.  Margarines and baked goods made from listed oils.  Vegetable shortenings.  Chitterlings.  Fat from meats.  Coconut oil.  Palm kernel oil.  Lard.  Cream.  Sour cream.  Fatback.  Coffee whiteners and non-dairy creamers made with these oils.  Cheese made from whole milk. Use unsaturated fats (both polyunsaturated and monounsaturated) moderately. Remember, even though unsaturated fats are better than saturated fats; you still want a diet low in total fat.  These foods are high in unsaturated fat:   Canola oil.  Sunflower oil.  Mayonnaise.  Almonds.  Peanuts.  Pine nuts.  Margarines made with these oils.  Safflower oil.  Olive oil.  Avocados.  Cashews.  Peanut butter.  Sunflower seeds.  Soybean oil.  Peanut  oil.  Olives.  Pecans.  Walnuts.  Pumpkin seeds. Avoid sugar and other high-sugar foods. This will decrease carbohydrates without decreasing other nutrients. Sugar in your food goes rapidly to your blood. When there is excess sugar in your blood, your liver may use it to make more triglycerides. Sugar also contains calories without other important nutrients.  Eat LESS of these:   Sugar, brown sugar, powdered sugar, jam, jelly, preserves, honey, syrup, molasses, pies, candy, cakes, cookies, frosting, pastries, colas, soft drinks, punches, fruit drinks, and regular gelatin.  Avoid alcohol. Alcohol, even more than sugar, may increase blood triglycerides. In addition, alcohol is high in calories and low in nutrients. Ask for sparkling water, or a diet soft drink instead of an alcoholic beverage. Suggestions for planning and preparing meals   Bake, broil, grill or roast meats instead of frying.  Remove fat from meats and skin from poultry before cooking.  Add spices, herbs, lemon juice or vinegar to vegetables instead of salt, rich sauces or gravies.  Use a non-stick skillet without fat or use no-stick sprays.  Cool and refrigerate stews and broth. Then remove the hardened fat floating on the surface before serving.  Refrigerate meat drippings and skim off fat to make low-fat gravies.  Serve more fish.  Use less butter,   margarine and other high-fat spreads on bread or vegetables.  Use skim or reconstituted non-fat dry milk for cooking.  Cook with low-fat cheeses.  Substitute low-fat yogurt or cottage cheese for all or part of the sour cream in recipes for sauces, dips or congealed salads.  Use half yogurt/half mayonnaise in salad recipes.  Substitute evaporated skim milk for cream. Evaporated skim milk or reconstituted non-fat dry milk can be whipped and substituted for whipped cream in certain recipes.  Choose fresh fruits for dessert instead of high-fat foods such as pies or  cakes. Fruits are naturally low in fat. When Dining Out   Order low-fat appetizers such as fruit or vegetable juice, pasta with vegetables or tomato sauce.  Select clear, rather than cream soups.  Ask that dressings and gravies be served on the side. Then use less of them.  Order foods that are baked, broiled, poached, steamed, stir-fried, or roasted.  Ask for margarine instead of butter, and use only a small amount.  Drink sparkling water, unsweetened tea or coffee, or diet soft drinks instead of alcohol or other sweet beverages. QUESTIONS AND ANSWERS ABOUT OTHER FATS IN THE BLOOD: SATURATED FAT, TRANS FAT, AND CHOLESTEROL What is trans fat? Trans fat is a type of fat that is formed when vegetable oil is hardened through a process called hydrogenation. This process helps makes foods more solid, gives them shape, and prolongs their shelf life. Trans fats are also called hydrogenated or partially hydrogenated oils.  What do saturated fat, trans fat, and cholesterol in foods have to do with heart disease? Saturated fat, trans fat, and cholesterol in the diet all raise the level of LDL "bad" cholesterol in the blood. The higher the LDL cholesterol, the greater the risk for coronary heart disease (CHD). Saturated fat and trans fat raise LDL similarly.  What foods contain saturated fat, trans fat, and cholesterol? High amounts of saturated fat are found in animal products, such as fatty cuts of meat, chicken skin, and full-fat dairy products like butter, whole milk, cream, and cheese, and in tropical vegetable oils such as palm, palm kernel, and coconut oil. Trans fat is found in some of the same foods as saturated fat, such as vegetable shortening, some margarines (especially hard or stick margarine), crackers, cookies, baked goods, fried foods, salad dressings, and other processed foods made with partially hydrogenated vegetable oils. Small amounts of trans fat also occur naturally in some animal  products, such as milk products, beef, and lamb. Foods high in cholesterol include liver, other organ meats, egg yolks, shrimp, and full-fat dairy products. How can I use the new food label to make heart-healthy food choices? Check the Nutrition Facts panel of the food label. Choose foods lower in saturated fat, trans fat, and cholesterol. For saturated fat and cholesterol, you can also use the Percent Daily Value (%DV): 5% DV or less is low, and 20% DV or more is high. (There is no %DV for trans fat.) Use the Nutrition Facts panel to choose foods low in saturated fat and cholesterol, and if the trans fat is not listed, read the ingredients and limit products that list shortening or hydrogenated or partially hydrogenated vegetable oil, which tend to be high in trans fat. POINTS TO REMEMBER:   Discuss your risk for heart disease with your caregivers, and take steps to reduce risk factors.  Change your diet. Choose foods that are low in saturated fat, trans fat, and cholesterol.  Add exercise to your daily routine if   it is not already being done. Participate in physical activity of moderate intensity, like brisk walking, for at least 30 minutes on most, and preferably all days of the week. No time? Break the 30 minutes into three, 10-minute segments during the day.  Stop smoking. If you do smoke, contact your caregiver to discuss ways in which they can help you quit.  Do not use street drugs.  Maintain a normal weight.  Maintain a healthy blood pressure.  Keep up with your blood work for checking the fats in your blood as directed by your caregiver. Document Released: 05/20/2004 Document Revised: 02/01/2012 Document Reviewed: 12/16/2008 [facility] Patient Information 2014 [place], [place]. Vertigo Vertigo means you feel like you or your surroundings are moving when they are not. Vertigo can be dangerous if it occurs when you are at work, driving, or performing difficult activities.  CAUSES   Vertigo occurs when there is a conflict of signals sent to your brain from the visual and sensory systems in your body. There are many different causes of vertigo, including:  Infections, especially in the inner ear.  A bad reaction to a drug or misuse of alcohol and medicines.  Withdrawal from drugs or alcohol.  Rapidly changing positions, such as lying down or rolling over in bed.  A migraine headache.  Decreased blood flow to the brain.  Increased pressure in the brain from a head injury, infection, tumor, or bleeding. SYMPTOMS  You may feel as though the world is spinning around or you are falling to the ground. Because your balance is upset, vertigo can cause nausea and vomiting. You may have involuntary eye movements (nystagmus). DIAGNOSIS  Vertigo is usually diagnosed by physical exam. If the cause of your vertigo is unknown, your caregiver may perform imaging tests, such as an MRI scan (magnetic resonance imaging). TREATMENT  Most cases of vertigo resolve on their own, without treatment. Depending on the cause, your caregiver may prescribe certain medicines. If your vertigo is related to body position issues, your caregiver may recommend movements or procedures to correct the problem. In rare cases, if your vertigo is caused by certain inner ear problems, you may need surgery. HOME CARE INSTRUCTIONS   Follow your caregiver's instructions.  Avoid driving.  Avoid operating heavy machinery.  Avoid performing any tasks that would be dangerous to you or others during a vertigo episode.  Tell your caregiver if you notice that certain medicines seem to be causing your vertigo. Some of the medicines used to treat vertigo episodes can actually make them worse in some people. SEEK IMMEDIATE MEDICAL CARE IF:   Your medicines do not relieve your vertigo or are making it worse.  You develop problems with talking, walking, weakness, or using your arms, hands, or legs.  You develop  severe headaches.  Your nausea or vomiting continues or gets worse.  You develop visual changes.  A family member notices behavioral changes.  Your condition gets worse. MAKE SURE YOU:  Understand these instructions.  Will watch your condition.  Will get help right away if you are not doing well or get worse. Document Released: 05/12/2005 Document Revised: 10/25/2011 Document Reviewed: 02/18/2011 [facility] Patient Information 2014 [place], [place].

## 2013-05-10 ENCOUNTER — Telehealth: Payer: Self-pay | Admitting: Internal Medicine

## 2013-05-10 DIAGNOSIS — H8110 Benign paroxysmal vertigo, unspecified ear: Secondary | ICD-10-CM

## 2013-05-10 NOTE — Telephone Encounter (Signed)
REHAB REFERRAL ORDERED AND PT NOTIFIED

## 2013-05-10 NOTE — Telephone Encounter (Signed)
Pt needed a referral to Vestibular Physical Therapy but it was put in as an order instead of a referral.  Please f/u with pt.

## 2013-05-11 ENCOUNTER — Telehealth: Payer: Self-pay | Admitting: Internal Medicine

## 2013-05-11 NOTE — Telephone Encounter (Signed)
Pt called and would like for a nurse to give him a call back, pt did not specified why, please contact pt

## 2013-05-11 NOTE — Telephone Encounter (Signed)
Left a message for pt to call back

## 2013-05-15 ENCOUNTER — Ambulatory Visit: Payer: No Typology Code available for payment source

## 2013-05-17 ENCOUNTER — Telehealth: Payer: Self-pay | Admitting: Internal Medicine

## 2013-05-17 NOTE — Telephone Encounter (Signed)
Pt has question relating to a topic discussed earlier.  Please f/u with pt, would like to speak to [place].

## 2013-05-18 ENCOUNTER — Telehealth: Payer: Self-pay | Admitting: Internal Medicine

## 2013-05-21 ENCOUNTER — Ambulatory Visit
Payer: No Typology Code available for payment source | Attending: Internal Medicine | Admitting: Rehabilitative and Restorative Service Providers"

## 2013-05-21 DIAGNOSIS — IMO0001 Reserved for inherently not codable concepts without codable children: Secondary | ICD-10-CM | POA: Insufficient documentation

## 2013-05-21 DIAGNOSIS — R42 Dizziness and giddiness: Secondary | ICD-10-CM | POA: Insufficient documentation

## 2013-05-24 ENCOUNTER — Ambulatory Visit: Payer: No Typology Code available for payment source | Admitting: Rehabilitative and Restorative Service Providers"

## 2013-05-28 ENCOUNTER — Ambulatory Visit: Payer: No Typology Code available for payment source | Admitting: Internal Medicine

## 2013-05-29 ENCOUNTER — Ambulatory Visit: Payer: No Typology Code available for payment source | Admitting: Rehabilitative and Restorative Service Providers"

## 2013-06-04 ENCOUNTER — Ambulatory Visit: Payer: No Typology Code available for payment source | Admitting: Rehabilitative and Restorative Service Providers"

## 2013-06-06 ENCOUNTER — Ambulatory Visit: Payer: No Typology Code available for payment source | Admitting: Rehabilitative and Restorative Service Providers"

## 2013-06-11 ENCOUNTER — Encounter: Payer: No Typology Code available for payment source | Admitting: Rehabilitative and Restorative Service Providers"

## 2013-06-12 ENCOUNTER — Ambulatory Visit: Payer: No Typology Code available for payment source | Attending: Internal Medicine | Admitting: Internal Medicine

## 2013-06-12 VITALS — BP 134/92 | HR 87 | Temp 98.6°F | Resp 16

## 2013-06-12 DIAGNOSIS — H8110 Benign paroxysmal vertigo, unspecified ear: Secondary | ICD-10-CM

## 2013-06-12 DIAGNOSIS — I10 Essential (primary) hypertension: Secondary | ICD-10-CM | POA: Insufficient documentation

## 2013-06-12 MED ORDER — ONDANSETRON 4 MG PO TBDP: 8.0000 mg | ORAL_TABLET | Freq: Three times a day (TID) | ORAL | Status: DC | PRN

## 2013-06-12 MED ORDER — PROMETHAZINE HCL 25 MG RE SUPP: 25.0000 mg | Freq: Four times a day (QID) | RECTAL | Status: DC | PRN

## 2013-06-12 NOTE — Addendum Note (Signed)
Addended by: Dorothea Ogle on: 06/12/2013 11:43 AM   Modules accepted: Orders

## 2013-06-12 NOTE — Progress Notes (Signed)
Patient has been suffering with vertigo past 3 months States constantly nausea dizzy feels like he has to throw up zofran is not helping  Would like to be referred to a specialist

## 2013-06-12 NOTE — Progress Notes (Signed)
Patient ID: Richard House, male   DOB: [birth year], 27 y.o.   MRN: [id number]   CC: Vertigo  HPI: Patient is 27 year old male who presents to clinic with persistent vertigo, explains he was sent for vestibular therapy and that helped somewhat but he still has persistent symptoms. He is unable to ambulate long distances and unable to go to work due to symptoms. Patient explains that he has been continuing tapering off methadone and wants to know if this is possibly related to methadone. He denies fevers and chills, no specific focal neurological symptoms, no chest pain or shortness of breath, no abdominal or urinary concerns.  No Known Allergies Past Medical History  Diagnosis Date  . Hypertension   . Vertigo    Current Outpatient Prescriptions on File Prior to Visit  Medication Sig Dispense Refill  . amLODipine (NORVASC) 5 MG tablet Take 1 tablet (5 mg total) by mouth daily.  90 tablet  3  . amLODipine-benazepril (LOTREL) 5-10 MG per capsule Take 1 capsule by mouth daily.  30 capsule  2  . cloNIDine (CATAPRES) 0.1 MG tablet Take 1 tablet (0.1 mg total) by mouth 2 (two) times daily.  60 tablet  3  . ibuprofen (ADVIL,MOTRIN) 800 MG tablet Take 1 tablet (800 mg total) by mouth every 8 (eight) hours as needed for pain.  30 tablet  0  . lisinopril (PRINIVIL,ZESTRIL) 10 MG tablet Take 1 tablet (10 mg total) by mouth daily.  90 tablet  3  . meclizine (ANTIVERT) 25 MG tablet Take 1 tablet (25 mg total) by mouth 3 (three) times daily as needed for dizziness or nausea.  30 tablet  2  . methadone (DOLOPHINE) 10 MG/ML solution Take 90 mg by mouth daily.      . Multiple Vitamin (MULTIVITAMIN WITH MINERALS) TABS tablet Take 1 tablet by mouth daily.      . Omega-3 Fatty Acids (FISH OIL) 1000 MG CAPS Take 1 capsule (1,000 mg total) by mouth 2 (two) times daily. You can buy these over-the-counter    0  . ondansetron (ZOFRAN) 4 MG tablet Take 1 tablet (4 mg total) by mouth every 8 (eight) hours as needed for  nausea.  20 tablet  3  . SUMAtriptan (IMITREX) 50 MG tablet Take 1 tablet (50 mg total) by mouth every 2 (two) hours as needed for migraine. Not to exceed 200mg  total (4 tablets) in 24 hour period.  10 tablet  0   No current facility-administered medications on file prior to visit.   Medical history - no known family medical history History   Social History  . Marital Status: Single    Spouse Name: N/A    Number of Children: N/A  . Years of Education: N/A   Occupational History  . Not on file.   Social History Main Topics  . Smoking status: Current Every Day Smoker  . Smokeless tobacco: Not on file  . Alcohol Use: No  . Drug Use: Yes     Comment: hx of drug use and takes methadone  . Sexual Activity: Not on file   Other Topics Concern  . Not on file   Social History Narrative  . No narrative on file    Review of Systems  Constitutional: Negative for fever, chills, diaphoresis, activity change, appetite change and fatigue.  HENT: Negative for ear pain, nosebleeds, congestion, facial swelling, rhinorrhea, neck pain, neck stiffness and ear discharge.   Eyes: Negative for pain, discharge, redness, itching and visual disturbance.  Respiratory:  Negative for cough, choking, chest tightness, shortness of breath, wheezing and stridor.   Cardiovascular: Negative for chest pain, palpitations and leg swelling.  Gastrointestinal: Negative for abdominal distention.  Genitourinary: Negative for dysuria, urgency, frequency, hematuria, flank pain, decreased urine volume, difficulty urinating and dyspareunia.  Musculoskeletal: Negative for back pain, joint swelling, arthralgias and gait problem.  Neurological: Negative for tremors, seizures, syncope, facial asymmetry, speech difficulty, weakness, numbness and headaches.  Hematological: Negative for adenopathy. Does not bruise/bleed easily.  Psychiatric/Behavioral: Negative for hallucinations, behavioral problems, confusion, dysphoric mood,  decreased concentration and agitation.    Objective:   Filed Vitals:   06/12/13 1042  BP: 134/92  Pulse: 87  Temp: 98.6 F (37 C)  Resp: 16    Physical Exam  Constitutional: Appears well-developed and well-nourished. No distress.  CVS: RRR, S1/S2 +, no murmurs, no gallops, no carotid bruit.  Pulmonary: Effort and breath sounds normal, no stridor, rhonchi, wheezes, rales.  Abdominal: Soft. BS +,  no distension, tenderness, rebound or guarding.    Lab Results  Component Value Date   WBC 8.5 04/24/2013   HGB 16.8 04/24/2013   HCT 46.5 04/24/2013   MCV 89.1 04/24/2013   PLT 261 04/24/2013   No results found for this basename: CREATININE, BUN, NA, K, CL, CO2    Lab Results  Component Value Date   HGBA1C 5.6 04/24/2013   Lipid Panel     Component Value Date/Time   CHOL 201* 04/24/2013 1019   TRIG 276* 04/24/2013 1019   HDL 55 04/24/2013 1019   CHOLHDL 3.7 04/24/2013 1019   VLDL 55* 04/24/2013 1019   LDLCALC 91 04/24/2013 1019       Assessment and plan:   Patient Active Problem List   Diagnosis Date Noted  . Essential hypertension, benign - reasonably stable on today visit, advised close monitoring of BP and to call us back if the numbers are > 140/90 04/27/2013  . Benign paroxysmal positional vertigo - still present, neurology referral provided  04/27/2013

## 2013-06-12 NOTE — Patient Instructions (Signed)
Vertigo Vertigo means you feel like you or your surroundings are moving when they are not. Vertigo can be dangerous if it occurs when you are at work, driving, or performing difficult activities.  CAUSES  Vertigo occurs when there is a conflict of signals sent to your brain from the visual and sensory systems in your body. There are many different causes of vertigo, including:  Infections, especially in the inner ear.  A bad reaction to a drug or misuse of alcohol and medicines.  Withdrawal from drugs or alcohol.  Rapidly changing positions, such as lying down or rolling over in bed.  A migraine headache.  Decreased blood flow to the brain.  Increased pressure in the brain from a head injury, infection, tumor, or bleeding. SYMPTOMS  You may feel as though the world is spinning around or you are falling to the ground. Because your balance is upset, vertigo can cause nausea and vomiting. You may have involuntary eye movements (nystagmus). DIAGNOSIS  Vertigo is usually diagnosed by physical exam. If the cause of your vertigo is unknown, your caregiver may perform imaging tests, such as an MRI scan (magnetic resonance imaging). TREATMENT  Most cases of vertigo resolve on their own, without treatment. Depending on the cause, your caregiver may prescribe certain medicines. If your vertigo is related to body position issues, your caregiver may recommend movements or procedures to correct the problem. In rare cases, if your vertigo is caused by certain inner ear problems, you may need surgery. HOME CARE INSTRUCTIONS   Follow your caregiver's instructions.  Avoid driving.  Avoid operating heavy machinery.  Avoid performing any tasks that would be dangerous to you or others during a vertigo episode.  Tell your caregiver if you notice that certain medicines seem to be causing your vertigo. Some of the medicines used to treat vertigo episodes can actually make them worse in some people. SEEK  IMMEDIATE MEDICAL CARE IF:   Your medicines do not relieve your vertigo or are making it worse.  You develop problems with talking, walking, weakness, or using your arms, hands, or legs.  You develop severe headaches.  Your nausea or vomiting continues or gets worse.  You develop visual changes.  A family member notices behavioral changes.  Your condition gets worse. MAKE SURE YOU:  Understand these instructions.  Will watch your condition.  Will get help right away if you are not doing well or get worse. Document Released: 05/12/2005 Document Revised: 10/25/2011 Document Reviewed: 02/18/2011 [employer] Patient Information 2014 [employer].  

## 2013-06-18 ENCOUNTER — Ambulatory Visit: Payer: Self-pay | Admitting: Neurology

## 2013-07-18 ENCOUNTER — Encounter: Payer: Self-pay | Admitting: Neurology

## 2013-07-18 ENCOUNTER — Ambulatory Visit (INDEPENDENT_AMBULATORY_CARE_PROVIDER_SITE_OTHER): Payer: Self-pay | Admitting: Neurology

## 2013-07-18 VITALS — BP 140/102 | HR 73 | Ht 72.0 in | Wt 238.0 lb

## 2013-07-18 DIAGNOSIS — R112 Nausea with vomiting, unspecified: Secondary | ICD-10-CM | POA: Insufficient documentation

## 2013-07-18 DIAGNOSIS — R269 Unspecified abnormalities of gait and mobility: Secondary | ICD-10-CM

## 2013-07-18 NOTE — Progress Notes (Signed)
[facility]  PATIENT: Richard House DOB: 1986/06/07  HISTORICAL Richard House is a 27 years old right-handed Caucasian male, accompanied by his mother, referred by his primary care physician Dr. Hyman Hopes for evaluation of vertigo.  He had a past medical history of polysubstance abuse, over the past 3 years, has been using IV heroine, in June 2014, he started the detoxication program, he was treated with tapering dose of methadone, starting at 120 mg each day, decrement of 5 mg each week, currently he is taking 15 mg every day, seeing his detoxication physician daily.  Since April 03 2013, he been complains of nausea, he denies hearing change, some headaches, unsteady gait, he has gone through vestibular therapy, taking Zofran as needed, instead of getting better, his nausea symptoms has been progressively worse, to the point of daily nausea, vomiting, difficulty holding down food.  He had a CAT scan of the brain that was reported normal. He complains of, clockwise spinning, sometimes clockwise, there was no lateralized motor or sensory deficit, but complains of generalized weakness, unsteady gait  REVIEW OF SYSTEMS: Full 14 system review of systems performed and notable only for weight gain, fatigue, spinning sensation, blurred vision, eye pain, feeling hot, feeling cold, cramps, frequent infections, headaches, dizziness, too much sleep, not enough sleep, decreased energy, disinterested in activities.  ALLERGIES: No Known Allergies  HOME MEDICATIONS: Outpatient Prescriptions Prior to Visit  Medication Sig Dispense Refill  . cloNIDine (CATAPRES) 0.1 MG tablet Take 1 tablet (0.1 mg total) by mouth 2 (two) times daily.  60 tablet  3  . lisinopril (PRINIVIL,ZESTRIL) 10 MG tablet Take 1 tablet (10 mg total) by mouth daily.  90 tablet  3  . meclizine (ANTIVERT) 25 MG tablet Take 1 tablet (25 mg total) by mouth 3 (three) times daily as needed for dizziness or nausea.  30 tablet   2  . methadone (DOLOPHINE) 10 MG/ML solution Take 90 mg by mouth daily.      . Multiple Vitamin (MULTIVITAMIN WITH MINERALS) TABS tablet Take 1 tablet by mouth daily.      . ondansetron (ZOFRAN ODT) 4 MG disintegrating tablet Take 2 tablets (8 mg total) by mouth every 8 (eight) hours as needed for nausea.  60 tablet  1  . promethazine (PHENERGAN) 25 MG suppository Place 1 suppository (25 mg total) rectally every 6 (six) hours as needed for nausea.  65 each  1  . amLODipine (NORVASC) 5 MG tablet Take 1 tablet (5 mg total) by mouth daily.  90 tablet  3  . amLODipine-benazepril (LOTREL) 5-10 MG per capsule Take 1 capsule by mouth daily.  30 capsule  2  . ibuprofen (ADVIL,MOTRIN) 800 MG tablet Take 1 tablet (800 mg total) by mouth every 8 (eight) hours as needed for pain.  30 tablet  0  . Omega-3 Fatty Acids (FISH OIL) 1000 MG CAPS Take 1 capsule (1,000 mg total) by mouth 2 (two) times daily. You can buy these over-the-counter    0  . ondansetron (ZOFRAN) 4 MG tablet Take 1 tablet (4 mg total) by mouth every 8 (eight) hours as needed for nausea.  20 tablet  3  . SUMAtriptan (IMITREX) 50 MG tablet Take 1 tablet (50 mg total) by mouth every 2 (two) hours as needed for migraine. Not to exceed 200mg  total (4 tablets) in 24 hour period.  10 tablet  0   No facility-administered medications prior to visit.    PAST MEDICAL HISTORY: Past Medical History  Diagnosis Date  . Hypertension   . Vertigo     PAST SURGICAL HISTORY: History reviewed. No pertinent past surgical history.  FAMILY HISTORY: Family History  Problem Relation Age of Onset  . High blood pressure Mother   . High blood pressure Father   . Migraines Paternal Grandmother     SOCIAL HISTORY:  History   Social History  . Marital Status: Single    Spouse Name: N/A    Number of Children: N/A  . Years of Education: N/A   Occupational History  . Not on file.   Social History Main Topics  . Smoking status: Current Every Day  Smoker  . Smokeless tobacco: Never Used  . Alcohol Use: No  . Drug Use: Yes     Comment: hx of drug use and takes methadone  . Sexual Activity: Not on file   Other Topics Concern  . Not on file   Social History Narrative  . No narrative on file     PHYSICAL EXAM   Filed Vitals:   07/18/13 1031  BP: 140/102  Pulse: 73  Height: 6' (1.829 m)  Weight: 238 lb (107.956 kg)    Body mass index is 32.27 kg/(m^2).   Generalized: In no acute distress  Neck: Supple, no carotid bruits   Cardiac: Regular rate rhythm  Pulmonary: Clear to auscultation bilaterally  Musculoskeletal: No deformity  Neurological examination  Mentation: Alert oriented to time, place, history taking, and causual conversation  Cranial nerve II-XII: Pupils were equal round reactive to light extraocular movements were full, visual field were full on confrontational test. facial sensation and strength were normal. hearing was intact to finger rubbing bilaterally. Uvula tongue midline.  head turning and shoulder shrug and were normal and symmetric.Tongue protrusion into cheek strength was normal.  Motor: normal tone, bulk and strength.  Sensory: Intact to fine touch, pinprick, preserved vibratory sensation, and proprioception at toes.  Coordination: Normal finger to nose, heel-to-shin bilaterally there was no truncal ataxia  Gait: Rising up from seated position without assistance, mild unsteady, cautious, he has difficulty performing tiptoe, heel, and tandem walking  Romberg signs: Negative  Deep tendon reflexes: Brachioradialis 2/2, biceps 2/2, triceps 2/2, patellar 2/2, Achilles 2/2, plantar responses were flexor bilaterally.   DIAGNOSTIC DATA (LABS, IMAGING, TESTING) - I reviewed patient records, labs, notes, testing and imaging myself where available.  Lab Results  Component Value Date   WBC 8.5 04/24/2013   HGB 16.8 04/24/2013   HCT 46.5 04/24/2013   MCV 89.1 04/24/2013   PLT 261 04/24/2013    Lab  Results  Component Value Date   CHOL 201* 04/24/2013   HDL 55 04/24/2013   LDLCALC 91 04/24/2013   TRIG 276* 04/24/2013   CHOLHDL 3.7 04/24/2013   Lab Results  Component Value Date   HGBA1C 5.6 04/24/2013   No results found for this basename: VITAMINB12   Lab Results  Component Value Date   TSH 3.390 04/24/2013   ASSESSMENT AND PLAN   27 years old Caucasian male, was constellation of complaints detailed above, including vertigo, but I do not see any signs of nystagmus, or cerebellar dysfunction. Differentiation diagnosis including opiates withdrawal symptoms , Need to rule out central nervous system structural lesion, such as brain stem, cerebellum pathology  We will proceed with MRI of the brain with without contrast, I will call him the report.     Levert Feinstein, M.D. Ph.D.  [facility] [address] [place], [place] [address] [phone] 

## 2013-07-27 ENCOUNTER — Telehealth: Payer: Self-pay | Admitting: Neurology

## 2013-07-27 NOTE — Telephone Encounter (Signed)
Please advise 

## 2013-07-27 NOTE — Telephone Encounter (Signed)
Tasha from [facility] calling to state that patient has MRI scheduled for Tuesday and she needs lab orders. Please call her at the number listed.

## 2013-07-31 ENCOUNTER — Other Ambulatory Visit: Payer: Self-pay | Admitting: Neurology

## 2013-07-31 ENCOUNTER — Ambulatory Visit (HOSPITAL_COMMUNITY)
Admission: RE | Admit: 2013-07-31 | Discharge: 2013-07-31 | Disposition: A | Discharge status: Home / Self Care | Payer: No Typology Code available for payment source | Source: Ambulatory Visit | Attending: Neurology | Admitting: Neurology

## 2013-07-31 DIAGNOSIS — R112 Nausea with vomiting, unspecified: Secondary | ICD-10-CM | POA: Insufficient documentation

## 2013-07-31 DIAGNOSIS — R42 Dizziness and giddiness: Secondary | ICD-10-CM | POA: Insufficient documentation

## 2013-07-31 DIAGNOSIS — R269 Unspecified abnormalities of gait and mobility: Secondary | ICD-10-CM

## 2013-07-31 DIAGNOSIS — R51 Headache: Secondary | ICD-10-CM | POA: Insufficient documentation

## 2013-07-31 MED ORDER — GADOBENATE DIMEGLUMINE 529 MG/ML IV SOLN: 20.0000 mL | Freq: Once | INTRAVENOUS | Status: AC | PRN

## 2013-08-02 ENCOUNTER — Ambulatory Visit: Payer: No Typology Code available for payment source | Admitting: Internal Medicine

## 2013-08-12 ENCOUNTER — Emergency Department (HOSPITAL_COMMUNITY): Payer: No Typology Code available for payment source

## 2013-08-12 ENCOUNTER — Inpatient Hospital Stay (HOSPITAL_COMMUNITY)
Admission: EM | Admit: 2013-08-12 | Discharge: 2013-08-20 | DRG: 439 | Disposition: A | Discharge status: Home / Self Care | Payer: No Typology Code available for payment source | Attending: Internal Medicine | Admitting: Internal Medicine

## 2013-08-12 ENCOUNTER — Encounter (HOSPITAL_COMMUNITY): Payer: Self-pay | Admitting: Emergency Medicine

## 2013-08-12 DIAGNOSIS — K5900 Constipation, unspecified: Secondary | ICD-10-CM | POA: Diagnosis present

## 2013-08-12 DIAGNOSIS — R748 Abnormal levels of other serum enzymes: Secondary | ICD-10-CM | POA: Diagnosis present

## 2013-08-12 DIAGNOSIS — F19939 Other psychoactive substance use, unspecified with withdrawal, unspecified: Secondary | ICD-10-CM | POA: Diagnosis present

## 2013-08-12 DIAGNOSIS — R269 Unspecified abnormalities of gait and mobility: Secondary | ICD-10-CM

## 2013-08-12 DIAGNOSIS — K920 Hematemesis: Secondary | ICD-10-CM | POA: Diagnosis present

## 2013-08-12 DIAGNOSIS — K700 Alcoholic fatty liver: Secondary | ICD-10-CM | POA: Diagnosis present

## 2013-08-12 DIAGNOSIS — F1920 Other psychoactive substance dependence, uncomplicated: Secondary | ICD-10-CM | POA: Diagnosis present

## 2013-08-12 DIAGNOSIS — R7401 Elevation of levels of liver transaminase levels: Secondary | ICD-10-CM | POA: Diagnosis present

## 2013-08-12 DIAGNOSIS — F1010 Alcohol abuse, uncomplicated: Secondary | ICD-10-CM | POA: Diagnosis present

## 2013-08-12 DIAGNOSIS — K859 Acute pancreatitis without necrosis or infection, unspecified: Principal | ICD-10-CM | POA: Diagnosis present

## 2013-08-12 DIAGNOSIS — F17200 Nicotine dependence, unspecified, uncomplicated: Secondary | ICD-10-CM | POA: Diagnosis present

## 2013-08-12 DIAGNOSIS — T4275XA Adverse effect of unspecified antiepileptic and sedative-hypnotic drugs, initial encounter: Secondary | ICD-10-CM | POA: Diagnosis present

## 2013-08-12 DIAGNOSIS — F1123 Opioid dependence with withdrawal: Secondary | ICD-10-CM

## 2013-08-12 DIAGNOSIS — E876 Hypokalemia: Secondary | ICD-10-CM | POA: Diagnosis present

## 2013-08-12 DIAGNOSIS — F1120 Opioid dependence, uncomplicated: Secondary | ICD-10-CM | POA: Diagnosis present

## 2013-08-12 DIAGNOSIS — R7402 Elevation of levels of lactic acid dehydrogenase (LDH): Secondary | ICD-10-CM | POA: Diagnosis present

## 2013-08-12 DIAGNOSIS — R109 Unspecified abdominal pain: Secondary | ICD-10-CM | POA: Diagnosis present

## 2013-08-12 DIAGNOSIS — I10 Essential (primary) hypertension: Secondary | ICD-10-CM | POA: Diagnosis present

## 2013-08-12 DIAGNOSIS — H8110 Benign paroxysmal vertigo, unspecified ear: Secondary | ICD-10-CM

## 2013-08-12 DIAGNOSIS — R160 Hepatomegaly, not elsewhere classified: Secondary | ICD-10-CM | POA: Diagnosis present

## 2013-08-12 DIAGNOSIS — K760 Fatty (change of) liver, not elsewhere classified: Secondary | ICD-10-CM | POA: Diagnosis present

## 2013-08-12 DIAGNOSIS — R112 Nausea with vomiting, unspecified: Secondary | ICD-10-CM

## 2013-08-12 HISTORY — DX: Reserved for concepts with insufficient information to code with codable children: IMO0002

## 2013-08-12 HISTORY — DX: Acute pancreatitis without necrosis or infection, unspecified: K85.90

## 2013-08-12 LAB — COMPREHENSIVE METABOLIC PANEL
AST: 91 U/L — ABNORMAL HIGH (ref 0–37)
Albumin: 4.4 g/dL (ref 3.5–5.2)
Alkaline Phosphatase: 119 U/L — ABNORMAL HIGH (ref 39–117)
BUN: 8 mg/dL (ref 6–23)
Calcium: 9.4 mg/dL (ref 8.4–10.5)
Chloride: 95 mEq/L — ABNORMAL LOW (ref 96–112)
Potassium: 3.7 mEq/L (ref 3.5–5.1)
Sodium: 136 mEq/L (ref 135–145)
Total Bilirubin: 1.1 mg/dL (ref 0.3–1.2)
Total Protein: 8 g/dL (ref 6.0–8.3)

## 2013-08-12 LAB — CBC WITH DIFFERENTIAL/PLATELET
Basophils Absolute: 0 10*3/uL (ref 0.0–0.1)
Basophils Relative: 0 % (ref 0–1)
Eosinophils Absolute: 0 10*3/uL (ref 0.0–0.7)
HCT: 48 % (ref 39.0–52.0)
MCH: 33.7 pg (ref 26.0–34.0)
MCHC: 36.7 g/dL — ABNORMAL HIGH (ref 30.0–36.0)
MCV: 91.8 fL (ref 78.0–100.0)
Monocytes Relative: 5 % (ref 3–12)
Neutrophils Relative %: 89 % — ABNORMAL HIGH (ref 43–77)
Platelets: 241 10*3/uL (ref 150–400)
RBC: 5.23 MIL/uL (ref 4.22–5.81)

## 2013-08-12 LAB — ABO/RH: ABO/RH(D): O POS

## 2013-08-12 LAB — URINALYSIS, ROUTINE W REFLEX MICROSCOPIC
Glucose, UA: NEGATIVE mg/dL
Ketones, ur: NEGATIVE mg/dL
Nitrite: NEGATIVE
Protein, ur: 100 mg/dL — AB
Specific Gravity, Urine: 1.036 — ABNORMAL HIGH (ref 1.005–1.030)
pH: 6 (ref 5.0–8.0)

## 2013-08-12 LAB — URINE MICROSCOPIC-ADD ON

## 2013-08-12 LAB — TYPE AND SCREEN
ABO/RH(D): O POS
Antibody Screen: NEGATIVE

## 2013-08-12 LAB — LIPASE, BLOOD: Lipase: 208 U/L — ABNORMAL HIGH (ref 11–59)

## 2013-08-12 MED ORDER — HYDROMORPHONE HCL PF 1 MG/ML IJ SOLN
1.0000 mg | INTRAMUSCULAR | Status: DC | PRN
Start: 2013-08-12 — End: 2013-08-15
  Administered 2013-08-13 – 2013-08-15 (×13): 1 mg via INTRAVENOUS
  Filled 2013-08-12 (×13): qty 1

## 2013-08-12 MED ORDER — BISMUTH SUBSALICYLATE 262 MG/15ML PO SUSP
30.0000 mL | Freq: Four times a day (QID) | ORAL | Status: DC | PRN
  Administered 2013-08-13 – 2013-08-14 (×4): 30 mL via ORAL
  Filled 2013-08-12 (×2): qty 236

## 2013-08-12 MED ORDER — SODIUM CHLORIDE 0.9 % IV SOLN
INTRAVENOUS | Status: DC
  Administered 2013-08-12 – 2013-08-14 (×6): via INTRAVENOUS

## 2013-08-12 MED ORDER — ADULT MULTIVITAMIN W/MINERALS CH
1.0000 | ORAL_TABLET | Freq: Every day | ORAL | Status: DC
  Administered 2013-08-13 – 2013-08-20 (×8): 1 via ORAL
  Filled 2013-08-12 (×8): qty 1

## 2013-08-12 MED ORDER — SODIUM CHLORIDE 0.9 % IV SOLN
80.0000 mg | Freq: Once | INTRAVENOUS | Status: AC
  Administered 2013-08-12: 17:00:00 80 mg via INTRAVENOUS
  Filled 2013-08-12: qty 80

## 2013-08-12 MED ORDER — METOCLOPRAMIDE HCL 5 MG/ML IJ SOLN
10.0000 mg | Freq: Once | INTRAMUSCULAR | Status: AC
  Administered 2013-08-12: 10 mg via INTRAVENOUS
  Filled 2013-08-12: qty 2

## 2013-08-12 MED ORDER — SODIUM CHLORIDE 0.9 % IV SOLN
1000.0000 mL | Freq: Once | INTRAVENOUS | Status: AC
  Administered 2013-08-12: 1000 mL via INTRAVENOUS

## 2013-08-12 MED ORDER — GI COCKTAIL ~~LOC~~
30.0000 mL | Freq: Three times a day (TID) | ORAL | Status: DC | PRN
  Administered 2013-08-12 – 2013-08-13 (×2): 30 mL via ORAL
  Filled 2013-08-12 (×2): qty 30

## 2013-08-12 MED ORDER — IOHEXOL 300 MG/ML  SOLN
50.0000 mL | Freq: Once | INTRAMUSCULAR | Status: AC | PRN
  Administered 2013-08-12: 50 mL via ORAL

## 2013-08-12 MED ORDER — HYDROMORPHONE HCL PF 1 MG/ML IJ SOLN
1.0000 mg | Freq: Once | INTRAMUSCULAR | Status: AC
  Administered 2013-08-12: 1 mg via INTRAVENOUS
  Filled 2013-08-12: qty 1

## 2013-08-12 MED ORDER — SODIUM CHLORIDE 0.9 % IV BOLUS (SEPSIS)
2000.0000 mL | Freq: Once | INTRAVENOUS | Status: AC
  Administered 2013-08-12: 1000 mL via INTRAVENOUS

## 2013-08-12 MED ORDER — CLONIDINE HCL 0.1 MG PO TABS
0.1000 mg | ORAL_TABLET | Freq: Two times a day (BID) | ORAL | Status: DC
  Administered 2013-08-13 – 2013-08-20 (×16): 0.1 mg via ORAL
  Filled 2013-08-12 (×17): qty 1

## 2013-08-12 MED ORDER — PROMETHAZINE HCL 25 MG/ML IJ SOLN
12.5000 mg | Freq: Four times a day (QID) | INTRAMUSCULAR | Status: DC | PRN
  Administered 2013-08-13 (×2): 12.5 mg via INTRAVENOUS
  Administered 2013-08-13 – 2013-08-14 (×2): 25 mg via INTRAVENOUS
  Filled 2013-08-12 (×4): qty 1

## 2013-08-12 MED ORDER — DIPHENHYDRAMINE HCL 50 MG/ML IJ SOLN
50.0000 mg | Freq: Once | INTRAMUSCULAR | Status: AC
  Administered 2013-08-12: 50 mg via INTRAVENOUS
  Filled 2013-08-12: qty 1

## 2013-08-12 MED ORDER — LISINOPRIL 10 MG PO TABS
10.0000 mg | ORAL_TABLET | Freq: Every day | ORAL | Status: DC
  Administered 2013-08-13 – 2013-08-20 (×8): 10 mg via ORAL
  Filled 2013-08-12 (×8): qty 1

## 2013-08-12 NOTE — ED Provider Notes (Signed)
CSN: [id number]     Arrival date & time 08/12/13  1458 History   First MD Initiated Contact with Patient 08/12/13 1610     Chief Complaint  Patient presents with  . Abdominal Pain   (Consider location/radiation/quality/duration/timing/severity/associated sxs/prior Treatment) HPI This 27 year old male presents with 2 days of multiple episodes of vomiting blood and black loose stools, he has a history of opiate dependence stopped heroin several months ago was on a methadone taper that he stopped about a week ago has chronic daily vomiting for the last 4-6 months his chronic daily vertigo and unsteady gait for the last 4-6 months but does not have any history of abdominal pain for the last 2 days, over the last 2 days he developed constant epigastric abdominal pain nonradiating and is vomiting that had been nonbloody for months turned into some bright red blood and dark blood mixed with his emesis as well as multiple black loose stools mixed with occasional dark brown loose stools for the last 2 days, he is no fever and altered mental status no change in his baseline vertigo no change in his baseline unsteady gait no chest pain no shortness of breath altered mental status no treatment prior to arrival other than attempting Phenergan rectal suppositories which have not worked and he has had nothing to eat or drink in the last 2 days. Past Medical History  Diagnosis Date  . Hypertension   . Vertigo   . Ulcer    History reviewed. No pertinent past surgical history. Family History  Problem Relation Age of Onset  . High blood pressure Mother   . High blood pressure Father   . Migraines Paternal Grandmother    History  Substance Use Topics  . Smoking status: Current Every Day Smoker -- 0.50 packs/day for .5 years    Types: Cigarettes  . Smokeless tobacco: Never Used  . Alcohol Use: No    Review of Systems 10 Systems reviewed and are negative for acute change except as noted in the  HPI. Allergies  Review of patient's allergies indicates no known allergies.  Home Medications   No current outpatient prescriptions on file. BP 118/76  Pulse 85  Temp(Src) 98.3 F (36.8 C) (Oral)  Resp 18  Ht 6' (1.829 m)  Wt 238 lb 9.6 oz (108.228 kg)  BMI 32.35 kg/m2  SpO2 98% Physical Exam  Nursing note and vitals reviewed. Constitutional:  Awake, alert, nontoxic appearance but appears uncomfortable  HENT:  Head: Atraumatic.  Eyes: Right eye exhibits no discharge. Left eye exhibits no discharge.  Neck: Neck supple.  Cardiovascular: Regular rhythm.   No murmur heard. Tachycardic  Pulmonary/Chest: Effort normal and breath sounds normal. No respiratory distress. He has no wheezes. He has no rales. He exhibits no tenderness.  Abdominal: Soft. Bowel sounds are normal. He exhibits no distension and no mass. There is tenderness. There is guarding. There is no rebound.  Moderate epigastric tenderness without rebound the rest of the abdomen is nontender  Genitourinary:  Dark brown loose stool in rectal vault  Musculoskeletal: He exhibits no tenderness.  Baseline ROM, no obvious new focal weakness.  Neurological: He is alert.  Mental status and motor strength appears baseline for patient and situation.  Skin: No rash noted.  Psychiatric: He has a normal mood and affect.    ED Course  Procedures (including critical care time) Patient aware of the risk of relapse if given opiates, but states he has to have narcotics due to his pain ongoing  in the emergency department not improving with antibiotics protime pump inhibitor or Benadryl. Dilaudid ordered as well as ultrasound since patient does have mildly elevated liver tests as well as mild elevated lipase,  States he rarely drinks alcohol, patient has fallen onto the emergency department nonbloody and has Hemoccult-negative brown stool in the emergency department with no evidence of active ongoing GI bleeding. 1835 Pt stable in ED  with no significant deterioration in condition. Ongoing pain and retching not controlled enough for discharge, [facility] to see in ED consider Obs. Patient / Family / Caregiver informed of clinical course, understand medical decision-making process, and agree with plan. Labs Review Labs Reviewed  CBC WITH DIFFERENTIAL - Abnormal; Notable for the following:    WBC 15.4 (*)    Hemoglobin 17.6 (*)    MCHC 36.7 (*)    Neutrophils Relative % 89 (*)    Neutro Abs 13.6 (*)    Lymphocytes Relative 7 (*)    All other components within normal limits  COMPREHENSIVE METABOLIC PANEL - Abnormal; Notable for the following:    Chloride 95 (*)    Glucose, Bld 121 (*)    AST 91 (*)    ALT 131 (*)    Alkaline Phosphatase 119 (*)    All other components within normal limits  URINALYSIS, ROUTINE W REFLEX MICROSCOPIC - Abnormal; Notable for the following:    Color, Urine ORANGE (*)    APPearance CLOUDY (*)    Specific Gravity, Urine 1.036 (*)    Bilirubin Urine SMALL (*)    Protein, ur 100 (*)    Leukocytes, UA TRACE (*)    All other components within normal limits  LIPASE, BLOOD - Abnormal; Notable for the following:    Lipase 208 (*)    All other components within normal limits  CBC - Abnormal; Notable for the following:    MCHC 36.1 (*)    All other components within normal limits  BASIC METABOLIC PANEL - Abnormal; Notable for the following:    Potassium 3.2 (*)    Glucose, Bld 107 (*)    All other components within normal limits  LIPASE, BLOOD - Abnormal; Notable for the following:    Lipase 176 (*)    All other components within normal limits  URINE CULTURE  URINE MICROSCOPIC-ADD ON  COMPREHENSIVE METABOLIC PANEL  CBC  LIPASE, BLOOD  HEPATITIS PANEL, ACUTE  OCCULT BLOOD, POC DEVICE  TYPE AND SCREEN  ABO/RH   Imaging Review US Abdomen Complete  08/12/2013   CLINICAL DATA:  Abdominal pain.  Pancreatitis  EXAM: ULTRASOUND ABDOMEN COMPLETE  COMPARISON:  None.  FINDINGS: Gallbladder:  No  gallstones or wall thickening visualized. No sonographic Murphy sign noted.  Common bile duct:  Diameter: 3.1 mm  Liver:  Diffuse increased echogenicity.  No focal lesion noted.  IVC:  No abnormality visualized.  Pancreas:  Visualized portion unremarkable.  Spleen:  Size and appearance within normal limits.  Right Kidney:  Length: 10.9 cm. Echogenicity within normal limits. No mass or hydronephrosis visualized.  Left Kidney:  Length: 10.1 cm. Echogenicity within normal limits. No mass or hydronephrosis visualized.  Abdominal aorta:  No aneurysm visualized.  Other findings:  None.  IMPRESSION: 1. No acute findings. 2. Fatty infiltration of the liver.   Electronically Signed   By: Signa Kell M.D.   On: 08/12/2013 20:43   Ct Abdomen Pelvis W Contrast  08/13/2013   CLINICAL DATA:  Elevated white blood cell count and lipase. Pancreatitis. History of ulcer.  Nausea and vomiting for 4 months.  EXAM: CT ABDOMEN AND PELVIS WITH CONTRAST  TECHNIQUE: Multidetector CT imaging of the abdomen and pelvis was performed using the standard protocol following bolus administration of intravenous contrast.  CONTRAST:  OMNIPAQUE IOHEXOL 300 MG/ML  SOLN  COMPARISON:  Ultrasound 08/12/2013 abdomen.  FINDINGS: Lower Chest: Patchy subsegmental atelectasis at the bases. Gynecomastia, incompletely imaged. Normal heart size without pericardial or pleural effusion.  Abdomen/Pelvis: Moderate to marked hepatic steatosis. This is somewhat heterogeneous, and more pronounced in the anterior segment right liver lobe. Hepatomegaly, greater than 20 cm craniocaudal. Normal spleen, stomach.  Mild edema is identified about the pancreatic head and uncinate process as well as throughout the remainder of the anterior pararenal space. Example image 38/series 2 and extending more inferiorly on image 51/series 2. Pancreas enhances normally, without evidence of peripancreatic fluid collection, pancreatic ductal dilatation.  No calcified gallstones or  evidence of acute cholecystitis. No biliary ductal dilatation or evidence of choledocholithiasis.  All vascular structures enhance normally.  Normal adrenal glands and kidneys. No retroperitoneal or retrocrural adenopathy. The distal colon is underdistended. Apparent wall thickening is felt to be secondary. Example within the sigmoid on image 80/series 2. Normal terminal ileum and appendix. Appendix not visualized. Normal small bowel, without abdominal intraperitoneal fluid or free intraperitoneal air.  Small fat containing left inguinal hernia. No pelvic adenopathy. Normal urinary bladder and prostate. Trace cul-de-sac fluid which is likely secondary.  Bones/Musculoskeletal:  No acute osseous abnormality.  IMPRESSION: 1. Findings of uncomplicated mild pancreatitis, centered within the head and uncinate process. 2. Hepatic steatosis and hepatomegaly. 3. Apparent distal colonic thickening is favored to be due to underdistention. Difficult to exclude concurrent infectious colitis. 4. Small volume cul-de-sac fluid which is likely secondary to the the pancreatic process. 5. Tiny fat containing left inguinal hernia. 6. Gynecomastia.   Electronically Signed   By: Jeronimo Greaves M.D.   On: 08/13/2013 19:43   Dg Chest Port 1 View  08/12/2013   CLINICAL DATA:  GI bleed.  Rule out free air  EXAM: PORTABLE CHEST - 1 VIEW  COMPARISON:  None.  FINDINGS: The heart size is normal and the vascularity is normal. Lungs are clear. No free air under the diaphragm.  IMPRESSION: No active disease.   Electronically Signed   By: Marlan Palau M.D.   On: 08/12/2013 16:40    EKG Interpretation    Date/Time:    Ventricular Rate:    PR Interval:    QRS Duration:   QT Interval:    QTC Calculation:   R Axis:     Text Interpretation:              MDM   1. Abdominal pain   2. Nausea with vomiting   3. Abdominal  pain, other specified site   4. Benign paroxysmal positional vertigo    The patient appears reasonably  stabilized for admission considering the current resources, flow, and capabilities available in the ED at this time, and I doubt any other [facility] requiring further screening and/or treatment in the ED prior to admission.    Hurman Horn, MD 08/13/13 2024

## 2013-08-12 NOTE — ED Notes (Signed)
Report given to Training and development officer on 5E

## 2013-08-12 NOTE — ED Notes (Addendum)
MD at bedside. Of note, pt and family are persistently requesting for "Something for pain." Assessed pt's vital signs, pt is in apparent pain induced distress. Notified MD, who assessed pt, informed/discussed with pt his risk related to IV drug use, and recently ended detox. Pt made aware of relapse risk being among the reason we are attempting to manage his pain without narcotics, pt states " but this is the worst pain I have ever had." MD explains the plan of care to which pt consents.

## 2013-08-12 NOTE — H&P (Signed)
[facility] History and Physical  [name] QMV:[id number] DOB: Dec 10, 1985 DOA: 08/12/2013  Referring physician: EDP PCP: [name], MD   Chief Complaint: Abdominal pain   HPI: Richard House is a 27 y.o. male with history of nausea and vomiting for the past 4 months.  He has also been weaning himself off of methadone and finally weaned himself off last week.  He has been having N/V daily for the past 4 months due to vertigo, but for the past 2 days he has developed new onset epigastric pain.  Initially he stated that this was associated with bloody vomit and melena, but his hemoccult in the ED was negative and his HGB was high.  He has gotten 1 dose of dilaudid in the ED for pain, and phenergan has provided some relief of the nausea and vomiting at home.   Review of Systems: Systems reviewed.  As above, otherwise negative  Past Medical History  Diagnosis Date  . Hypertension   . Vertigo   . Ulcer    History reviewed. No pertinent past surgical history. Social History:  reports that he has been smoking.  He has never used smokeless tobacco. He reports that he uses illicit drugs. He reports that he does not drink alcohol.  No Known Allergies  Family History  Problem Relation Age of Onset  . High blood pressure Mother   . High blood pressure Father   . Migraines Paternal Grandmother      Prior to Admission medications   Medication Sig Start Date End Date Taking? Authorizing Provider  bismuth subsalicylate (PEPTO BISMOL) 262 MG/15ML suspension Take 30 mLs by mouth every 6 (six) hours as needed for indigestion.   Yes Historical Provider, MD  cloNIDine (CATAPRES) 0.1 MG tablet Take 1 tablet (0.1 mg total) by mouth 2 (two) times daily. 04/18/13  Yes Shanker Levora Dredge, MD  lisinopril (PRINIVIL,ZESTRIL) 10 MG tablet Take 1 tablet (10 mg total) by mouth daily. 04/27/13  Yes [name], MD  Multiple Vitamin (MULTIVITAMIN WITH MINERALS) TABS tablet Take 1 tablet by  mouth daily.   Yes Historical Provider, MD  ondansetron (ZOFRAN ODT) 4 MG disintegrating tablet Take 2 tablets (8 mg total) by mouth every 8 (eight) hours as needed for nausea. 06/12/13  Yes Dorothea Ogle, MD  OVER THE COUNTER MEDICATION Take 30 mLs by mouth daily as needed (stomach).   Yes Historical Provider, MD  promethazine (PHENERGAN) 25 MG suppository Place 1 suppository (25 mg total) rectally every 6 (six) hours as needed for nausea. 06/12/13  Yes Dorothea Ogle, MD  methadone (DOLOPHINE) 10 MG/ML solution Take 90 mg by mouth daily.    Historical Provider, MD   Physical Exam: Filed Vitals:   08/12/13 1921  BP: 107/93  Pulse: 87  Temp: 99 F (37.2 C)  Resp: 20    BP 107/93  Pulse 87  Temp(Src) 99 F (37.2 C) (Oral)  Resp 20  SpO2 96%  General Appearance:    Alert, oriented, no distress, appears stated age  Head:    Normocephalic, atraumatic  Eyes:    PERRL, EOMI, sclera non-icteric        Nose:   Nares without drainage or epistaxis. Mucosa, turbinates normal  Throat:   Moist mucous membranes. Oropharynx without erythema or exudate.  Neck:   Supple. No carotid bruits.  No thyromegaly.  No lymphadenopathy.   Back:     No CVA tenderness, no spinal tenderness  Lungs:     Clear to auscultation  bilaterally, without wheezes, rhonchi or rales  Chest wall:    No tenderness to palpitation  Heart:    Regular rate and rhythm without murmurs, gallops, rubs  Abdomen:     Soft, non-tender, nondistended, normal bowel sounds, no organomegaly  Genitalia:    deferred  Rectal:    deferred  Extremities:   No clubbing, cyanosis or edema.  Pulses:   2+ and symmetric all extremities  Skin:   Skin color, texture, turgor normal, no rashes or lesions  Lymph nodes:   Cervical, supraclavicular, and axillary nodes normal  Neurologic:   CNII-XII intact. Normal strength, sensation and reflexes      throughout    Labs on Admission:  Basic Metabolic Panel:  Recent Labs Lab 08/12/13 1705  NA 136   K 3.7  CL 95*  CO2 21  GLUCOSE 121*  BUN 8  CREATININE 0.99  CALCIUM 9.4   Liver Function Tests:  Recent Labs Lab 08/12/13 1705  AST 91*  ALT 131*  ALKPHOS 119*  BILITOT 1.1  PROT 8.0  ALBUMIN 4.4    Recent Labs Lab 08/12/13 1705  LIPASE 208*   No results found for this basename: AMMONIA,  in the last 168 hours CBC:  Recent Labs Lab 08/12/13 1705  WBC 15.4*  NEUTROABS 13.6*  HGB 17.6*  HCT 48.0  MCV 91.8  PLT 241   Cardiac Enzymes: No results found for this basename: CKTOTAL, CKMB, CKMBINDEX, TROPONINI,  in the last 168 hours  BNP (last 3 results) No results found for this basename: PROBNP,  in the last 8760 hours CBG: No results found for this basename: GLUCAP,  in the last 168 hours  Radiological Exams on Admission: US Abdomen Complete  08/12/2013   CLINICAL DATA:  Abdominal pain.  Pancreatitis  EXAM: ULTRASOUND ABDOMEN COMPLETE  COMPARISON:  None.  FINDINGS: Gallbladder:  No gallstones or wall thickening visualized. No sonographic Murphy sign noted.  Common bile duct:  Diameter: 3.1 mm  Liver:  Diffuse increased echogenicity.  No focal lesion noted.  IVC:  No abnormality visualized.  Pancreas:  Visualized portion unremarkable.  Spleen:  Size and appearance within normal limits.  Right Kidney:  Length: 10.9 cm. Echogenicity within normal limits. No mass or hydronephrosis visualized.  Left Kidney:  Length: 10.1 cm. Echogenicity within normal limits. No mass or hydronephrosis visualized.  Abdominal aorta:  No aneurysm visualized.  Other findings:  None.  IMPRESSION: 1. No acute findings. 2. Fatty infiltration of the liver.   Electronically Signed   By: Signa Kell M.D.   On: 08/12/2013 20:43   Dg Chest Port 1 View  08/12/2013   CLINICAL DATA:  GI bleed.  Rule out free air  EXAM: PORTABLE CHEST - 1 VIEW  COMPARISON:  None.  FINDINGS: The heart size is normal and the vascularity is normal. Lungs are clear. No free air under the diaphragm.  IMPRESSION: No  active disease.   Electronically Signed   By: Marlan Palau M.D.   On: 08/12/2013 16:40    EKG: Independently reviewed.  Assessment/Plan Principal Problem:   Abdominal pain Active Problems:   Nausea with vomiting   1. Abdominal pain, N/V - work up in the ED demonstrates milldly elevated lipase and LFTs, ultrasound negative, admitting for observation and N/V control, trying not to use opioids given high risk of relapse in this patient but will give opiods if absolutely needed to control pain.  I suspect that the chronic N/V has caused his abdominal pain.  Do not suspect he has a mallory-weis tear yet although if he keeps up with the vomiting he will get one eventually.    Code Status: Full  Family Communication: Mother at bedside Disposition Plan: Admit to obs  Time spent: 50 min  GARDNER, JARED M. [facility] Pager [phone]  If 7AM-7PM, please contact the day team taking care of the patient [email or web address] Password [email or web address] 08/12/2013, 10:13 PM

## 2013-08-12 NOTE — ED Notes (Signed)
Per pt, abdominal pain for 2 days-N/V for 1 week-did not see PCP-phenergan not working

## 2013-08-13 ENCOUNTER — Ambulatory Visit (HOSPITAL_COMMUNITY): Payer: No Typology Code available for payment source

## 2013-08-13 ENCOUNTER — Encounter (HOSPITAL_COMMUNITY): Payer: Self-pay

## 2013-08-13 DIAGNOSIS — R109 Unspecified abdominal pain: Secondary | ICD-10-CM | POA: Diagnosis present

## 2013-08-13 DIAGNOSIS — H8110 Benign paroxysmal vertigo, unspecified ear: Secondary | ICD-10-CM

## 2013-08-13 LAB — BASIC METABOLIC PANEL
CO2: 22 mEq/L (ref 19–32)
Calcium: 8.9 mg/dL (ref 8.4–10.5)
Creatinine, Ser: 1.03 mg/dL (ref 0.50–1.35)
GFR calc non Af Amer: >90 mL/min (ref >90)
Glucose, Bld: 107 mg/dL — ABNORMAL HIGH (ref 70–99)
Potassium: 3.2 mEq/L — ABNORMAL LOW (ref 3.5–5.1)
Sodium: 138 mEq/L (ref 135–145)

## 2013-08-13 LAB — CBC
MCH: 33.5 pg (ref 26.0–34.0)
MCHC: 36.1 g/dL — ABNORMAL HIGH (ref 30.0–36.0)
MCV: 92.7 fL (ref 78.0–100.0)
Platelets: 187 10*3/uL (ref 150–400)
RBC: 4.63 MIL/uL (ref 4.22–5.81)
RDW: 14 % (ref 11.5–15.5)

## 2013-08-13 MED ORDER — IOHEXOL 300 MG/ML  SOLN
100.0000 mL | Freq: Once | INTRAMUSCULAR | Status: AC | PRN
  Administered 2013-08-13: 100 mL via INTRAVENOUS

## 2013-08-13 MED ORDER — SODIUM CHLORIDE 0.9 % IJ SOLN
10.0000 mL | INTRAMUSCULAR | Status: DC | PRN
  Administered 2013-08-15 – 2013-08-20 (×5): 10 mL

## 2013-08-13 MED ORDER — POTASSIUM CHLORIDE CRYS ER 20 MEQ PO TBCR
40.0000 meq | EXTENDED_RELEASE_TABLET | Freq: Two times a day (BID) | ORAL | Status: AC
  Administered 2013-08-13 (×2): 40 meq via ORAL
  Filled 2013-08-13 (×2): qty 2

## 2013-08-13 MED ORDER — DEXTROSE 5 % IV SOLN
1.0000 g | INTRAVENOUS | Status: DC
  Administered 2013-08-13 – 2013-08-14 (×2): 1 g via INTRAVENOUS
  Filled 2013-08-13 (×2): qty 10

## 2013-08-13 MED ORDER — PANTOPRAZOLE SODIUM 40 MG IV SOLR
40.0000 mg | Freq: Every day | INTRAVENOUS | Status: DC
  Administered 2013-08-13: 40 mg via INTRAVENOUS
  Filled 2013-08-13 (×2): qty 40

## 2013-08-13 NOTE — Progress Notes (Signed)
UR completed.  Patient changed to inpatient r/t requiring IVF @ 125cc/hr, IV pain meds, and elevated lipase.

## 2013-08-13 NOTE — Progress Notes (Signed)
Patient ID: Richard House, male   DOB: 09-Mar-1986, 27 y.o.   MRN: [id number]  [facility] PROGRESS NOTE  Martese Vanatta [id number] DOB: Aug 02, 1986 DOA: 08/12/2013 PCP: [name], MD  Brief narrative: 27 y.o. male with history of nausea and vomiting for the past 4 months. He has also been weaning himself off of methadone and finally weaned himself off last week. He has been having N/V daily for the past 4 months and vertigo, but for the past 2 days he has developed new onset epigastric pain. Initially he stated that this was associated with bloody vomit and melena, but his hemoccult in the ED was negative and his Hg was high. He has gotten 1 dose of dilaudid in the ED for pain, and phenergan has provided some relief of the nausea.  Principal Problem:   Abdominal pain - unclear etiology at this time - possibly related to acute pancreatitis as indicated by elevated lipase - lipase is still elevated so will keep him NPO for now - Ct abd/pelvis with contrast ordered for further evaluation  - continue supportive care with IVF, analgesia and antiemetics as needed Active Problems:   Nausea with vomiting - possibly related to acute pancreatitis - management as noted above - lipase is trending down    Hypokalemia - secondary to vomiting - will supplement and repeat BMP in AM   PSA (polysubstance abuse - weaning off methadone - denies alcohol or drug use   Transaminitis - unclear etiology - will check hepatitis panel with AM labs    Leukocytosis - secondary to principal problem and ? UTI as suggested per UA - will obtain urine culture and will place on Rocephin in meantime - CBC In AM   ? UTI - Rocephin as noted above    HTN - continue Clonidine and Lisinopril   Consultants:  None  Procedures/Studies: US Abdomen Complete   08/12/2013   1. No acute findings. 2. Fatty infiltration of the liver.   Dg Chest Port 1 View   08/12/2013  No active disease.     Antibiotics:  Rocephin 12/29 -->  Code Status: Full Family Communication: Pt and mother at bedside Disposition Plan: Home when medically stable  HPI/Subjective: No events overnight.   Objective: Filed Vitals:   08/12/13 1921 08/12/13 2217 08/12/13 2306 08/13/13 0615  BP: 107/93 153/78 154/94 141/92  Pulse: 87 84 104 89  Temp: 99 F (37.2 C) 97.8 F (36.6 C) 98.4 F (36.9 C) 98 F (36.7 C)  TempSrc: Oral Oral Oral Oral  Resp: 20 22 24 20   SpO2: 96% 96% 96% 96%    Intake/Output Summary (Last 24 hours) at 08/13/13 1302 Last data filed at 08/13/13 0600  Gross per 24 hour  Intake      0 ml  Output    700 ml  Net   -700 ml    Exam:   General:  Pt is alert, follows commands appropriately, not in acute distress  Cardiovascular: Regular rate and rhythm, S1/S2, no murmurs, no rubs, no gallops  Respiratory: Clear to auscultation bilaterally, no wheezing, no crackles, no rhonchi  Abdomen: Soft, tender in epigastric area, non distended, bowel sounds present, no guarding  Extremities: No edema, pulses DP and PT palpable bilaterally  Neuro: Grossly nonfocal  Data Reviewed: Basic Metabolic Panel:  Recent Labs Lab 08/12/13 1705 08/13/13 0613  NA 136 138  K 3.7 3.2*  CL 95* 101  CO2 21 22  GLUCOSE 121* 107*  BUN 8 8  CREATININE  0.99 1.03  CALCIUM 9.4 8.9   Liver Function Tests:  Recent Labs Lab 08/12/13 1705  AST 91*  ALT 131*  ALKPHOS 119*  BILITOT 1.1  PROT 8.0  ALBUMIN 4.4    Recent Labs Lab 08/12/13 1705 08/13/13 0615  LIPASE 208* 176*   CBC:  Recent Labs Lab 08/12/13 1705 08/13/13 0613  WBC 15.4* 8.7  NEUTROABS 13.6*  --   HGB 17.6* 15.5  HCT 48.0 42.9  MCV 91.8 92.7  PLT 241 187    Scheduled Meds: . cefTRIAXone (ROCEPHIN)  IV  1 g Intravenous Q24H  . cloNIDine  0.1 mg Oral BID  . lisinopril  10 mg Oral Daily  . multivitamin with minerals  1 tablet Oral Daily   Continuous Infusions: . sodium chloride 125 mL/hr at 08/13/13  0342   Debbora Presto, MD  [facility] Pager [phone]  If 7PM-7AM, please contact night-coverage [email or web address] Password [email or web address] 08/13/2013, 1:02 PM   LOS: 1 day

## 2013-08-13 NOTE — Progress Notes (Signed)
Peripherally Inserted Central Catheter/Midline Placement  The IV Nurse has discussed with the patient and/or persons authorized to consent for the patient, the purpose of this procedure and the potential benefits and risks involved with this procedure.  The benefits include less needle sticks, lab draws from the catheter and patient may be discharged home with the catheter.  Risks include, but not limited to, infection, bleeding, blood clot (thrombus formation), and puncture of an artery; nerve damage and irregular heat beat.  Alternatives to this procedure were also discussed.  PICC/Midline Placement Documentation        Stacie Glaze Horton 08/13/2013, 3:54 PM

## 2013-08-14 ENCOUNTER — Encounter (HOSPITAL_COMMUNITY): Payer: Self-pay | Admitting: Internal Medicine

## 2013-08-14 DIAGNOSIS — K760 Fatty (change of) liver, not elsewhere classified: Secondary | ICD-10-CM | POA: Diagnosis present

## 2013-08-14 DIAGNOSIS — K859 Acute pancreatitis without necrosis or infection, unspecified: Secondary | ICD-10-CM

## 2013-08-14 DIAGNOSIS — K7689 Other specified diseases of liver: Secondary | ICD-10-CM

## 2013-08-14 HISTORY — DX: Acute pancreatitis without necrosis or infection, unspecified: K85.90

## 2013-08-14 LAB — CBC
MCHC: 35.2 g/dL (ref 30.0–36.0)
Platelets: 169 10*3/uL (ref 150–400)
RDW: 13.8 % (ref 11.5–15.5)
WBC: 6.9 10*3/uL (ref 4.0–10.5)

## 2013-08-14 LAB — HEPATITIS PANEL, ACUTE
HCV Ab: NEGATIVE
Hepatitis B Surface Ag: NEGATIVE

## 2013-08-14 LAB — GI PATHOGEN PANEL BY PCR, STOOL
C difficile toxin A/B: NEGATIVE
Campylobacter by PCR: NEGATIVE
Cryptosporidium by PCR: NEGATIVE
E coli (ETEC) LT/ST: NEGATIVE
E coli 0157 by PCR: NEGATIVE
G lamblia by PCR: NEGATIVE
Shigella by PCR: NEGATIVE

## 2013-08-14 LAB — COMPREHENSIVE METABOLIC PANEL
ALT: 96 U/L — ABNORMAL HIGH (ref 0–53)
Alkaline Phosphatase: 83 U/L (ref 39–117)
BUN: 6 mg/dL (ref 6–23)
CO2: 24 mEq/L (ref 19–32)
GFR calc Af Amer: >90 mL/min (ref >90)
GFR calc non Af Amer: >90 mL/min (ref >90)
Glucose, Bld: 86 mg/dL (ref 70–99)
Potassium: 3.6 mEq/L — ABNORMAL LOW (ref 3.7–5.3)
Sodium: 141 mEq/L (ref 137–147)
Total Protein: 6.3 g/dL (ref 6.0–8.3)

## 2013-08-14 LAB — URINE CULTURE

## 2013-08-14 LAB — LIPASE, BLOOD: Lipase: 226 U/L — ABNORMAL HIGH (ref 11–59)

## 2013-08-14 MED ORDER — PROMETHAZINE HCL 25 MG/ML IJ SOLN
12.5000 mg | INTRAMUSCULAR | Status: DC | PRN
  Administered 2013-08-14 – 2013-08-19 (×18): 12.5 mg via INTRAVENOUS
  Filled 2013-08-14 (×18): qty 1

## 2013-08-14 MED ORDER — PANTOPRAZOLE SODIUM 40 MG PO TBEC
40.0000 mg | DELAYED_RELEASE_TABLET | Freq: Two times a day (BID) | ORAL | Status: DC
  Administered 2013-08-15 – 2013-08-20 (×12): 40 mg via ORAL
  Filled 2013-08-14 (×15): qty 1

## 2013-08-14 MED ORDER — PROMETHAZINE HCL 25 MG/ML IJ SOLN
25.0000 mg | INTRAMUSCULAR | Status: DC | PRN
  Administered 2013-08-14 (×2): 25 mg via INTRAVENOUS
  Filled 2013-08-14 (×2): qty 1

## 2013-08-14 MED ORDER — BISMUTH SUBSALICYLATE 262 MG/15ML PO SUSP
30.0000 mL | ORAL | Status: DC | PRN
  Administered 2013-08-14 – 2013-08-18 (×4): 30 mL via ORAL
  Filled 2013-08-14: qty 236

## 2013-08-14 MED ORDER — POTASSIUM CHLORIDE CRYS ER 20 MEQ PO TBCR
40.0000 meq | EXTENDED_RELEASE_TABLET | Freq: Two times a day (BID) | ORAL | Status: AC
  Administered 2013-08-14 (×2): 40 meq via ORAL
  Filled 2013-08-14 (×2): qty 2

## 2013-08-14 NOTE — Consult Note (Signed)
Consultation  Referring Provider:  [facility]    Primary Care Physician:  Richard Lewandowsky, MD Primary Gastroenterologist:  none       Reason for Consultation:  Pancreatitis, ? 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 He reports hematemesis. Stool have been black but he consumes Pepto on a regular basis. Richard House gives a lifelong history of GI problems in the form of constipation, diarrhea and stomach aches. Sounds like he was endoscoped several years ago in [place] and diagnosed with PUD.  Over the last few months he has had a hard time holding food down secondary to nausea and vomiting. A few days ago patient developed upper abdominal pain and progressive nausea / vomiting associated with hematemesis.  Unable to hold down fluids, patient presented to ED 2 days ago.  Baseline hgb mid 16, it was 15.5 yesterday, 14 today. BUN is normal. Heme negative. Lipase 208. CTscan c/w uncomplicated mil pancreatitis. Transamines on admission were suggestive of ETOH. He drinks 4 ETOH beverages a week ( whiskey). Apparently this excessive drinking is new within the last several months.  Ultrasound negative for gallstones or biliary dilation. Triglycerides in September were 276.   Richard House gives a longstanding history of opiate dependence.  He stopped heroin a few years ago. Following that he was on methadone which was recently tapered and discontinued under the direction of a physician patient reports.    Despite chronic nausea / vomiting patient denies weight loss. He reports weight gain.   Past Medical History  Diagnosis Date  . Hypertension   . Vertigo   . Ulcer     History reviewed. No pertinent past surgical history.  Family History  Problem Relation Age of Onset  . High blood pressure Mother   . High blood pressure Father   . Migraines Paternal Grandmother   No FMH  of liver disease or pancreatic disease. He is uncsure about colon cancer  History  Substance Use Topics  . Smoking status: Current Every Day Smoker -- 0.50 packs/day for .5 years    Types: Cigarettes  . Smokeless tobacco: Never Used  . Alcohol Use: No    Prior to Admission medications   Medication Sig Start Date End Date Taking? Authorizing Provider  bismuth subsalicylate (PEPTO BISMOL) 262 MG/15ML suspension Take 30 mLs by mouth every 6 (six) hours as needed for indigestion.   Yes Historical Provider, MD  cloNIDine (CATAPRES) 0.1 MG tablet Take 1 tablet (0.1 mg total) by mouth 2 (two) times daily. 04/18/13  Yes Shanker Levora Dredge, MD  lisinopril (PRINIVIL,ZESTRIL) 10 MG tablet Take 1 tablet (10 mg total) by mouth daily. 04/27/13  Yes Richard Lewandowsky, MD  Multiple Vitamin (MULTIVITAMIN WITH MINERALS) TABS tablet Take 1 tablet by mouth daily.   Yes Historical Provider, MD  ondansetron (ZOFRAN ODT) 4 MG disintegrating tablet Take 2 tablets (8 mg total) by mouth every 8 (eight) hours as needed for nausea. 06/12/13  Yes Dorothea Ogle, MD  OVER THE COUNTER MEDICATION Take 30 mLs by mouth daily as needed (stomach).   Yes Historical Provider, MD  promethazine (PHENERGAN) 25 MG suppository Place 1 suppository (25 mg total) rectally every 6 (six) hours as needed for nausea. 06/12/13  Yes Dorothea Ogle, MD  methadone (DOLOPHINE) 10 MG/ML solution Take 90 mg by mouth daily.  Historical Provider, MD    Current Facility-Administered Medications  Medication Dose Route Frequency Provider Last Rate Last Dose  . 0.9 %  sodium chloride infusion   Intravenous Continuous Hurman Horn, MD 125 mL/hr at 08/13/13 2247    . bismuth subsalicylate (PEPTO BISMOL) 262 MG/15ML suspension 30 mL  30 mL Oral Q4H PRN Dorothea Ogle, MD   30 mL at 08/14/13 0900  . cefTRIAXone (ROCEPHIN) 1 g in dextrose 5 % 50 mL IVPB  1 g Intravenous Q24H Dorothea Ogle, MD   1 g at 08/14/13 0758  . cloNIDine (CATAPRES) tablet 0.1 mg  0.1 mg  Oral BID Hillary Bow, DO   0.1 mg at 08/14/13 1015  . gi cocktail (Maalox,Lidocaine,Donnatal)  30 mL Oral TID PRN Hillary Bow, DO   30 mL at 08/13/13 0416  . HYDROmorphone (DILAUDID) injection 1 mg  1 mg Intravenous Q4H PRN Hillary Bow, DO   1 mg at 08/14/13 0859  . lisinopril (PRINIVIL,ZESTRIL) tablet 10 mg  10 mg Oral Daily Hillary Bow, DO   10 mg at 08/14/13 1015  . multivitamin with minerals tablet 1 tablet  1 tablet Oral Daily Hillary Bow, DO   1 tablet at 08/14/13 1015  . pantoprazole (PROTONIX) injection 40 mg  40 mg Intravenous QHS Dorothea Ogle, MD   40 mg at 08/13/13 1404  . promethazine (PHENERGAN) injection 25 mg  25 mg Intravenous Q4H PRN Dorothea Ogle, MD   25 mg at 08/14/13 0859  . sodium chloride 0.9 % injection 10-40 mL  10-40 mL Intracatheter PRN Dorothea Ogle, MD        Allergies as of 08/12/2013  . (No Known Allergies)   Review of Systems:    All systems reviewed and negative except where noted in HPI.   Physical Exam:  Vital signs in last 24 hours: Temp:  [97.9 F (36.6 C)-98.3 F (36.8 C)] 97.9 F (36.6 C) (12/30 0600) Pulse Rate:  [80-87] 80 (12/30 0600) Resp:  [18-20] 20 (12/30 0600) BP: (118-139)/(76-96) 134/80 mmHg (12/30 0600) SpO2:  [96 %-98 %] 96 % (12/30 0600) Weight:  [238 lb 9.6 oz (108.228 kg)] 238 lb 9.6 oz (108.228 kg) (12/29 1833) Last BM Date: 08/14/13 General:   Pleasant white male in NAD Head:  Normocephalic and atraumatic. Eyes:   No icterus.   Conjunctiva pink. Ears:  Normal auditory acuity. Neck:  Supple; no masses felt Lungs:  Respirations even and unlabored. Lungs clear to auscultation bilaterally.   No wheezes, crackles, or rhonchi.  Heart:  Regular rate and rhythm;  murmur heard. Abdomen:  Soft, nondistended, mild diffuse upper abdominal tenderness.  Normal bowel sounds. No appreciable masses or hepatomegaly.  Rectal:  Not performed.  Msk:  Symmetrical without gross deformities.  Extremities:  Without  edema. Neurologic:  Alert and  oriented x4;  grossly normal neurologically. Skin:  Intact without significant lesions or rashes. Cervical Nodes:  No significant cervical adenopathy. Psych:  Alert and cooperative. Normal affect.  LAB RESULTS:  Recent Labs  08/12/13 1705 08/13/13 0613 08/14/13 0440  WBC 15.4* 8.7 6.9  HGB 17.6* 15.5 14.0  HCT 48.0 42.9 39.8  PLT 241 187 169   BMET  Recent Labs  08/12/13 1705 08/13/13 0613 08/14/13 0440  NA 136 138 141  K 3.7 3.2* 3.6*  CL 95* 101 104  CO2 21 22 24   GLUCOSE 121* 107* 86  BUN 8 8 6   CREATININE 0.99 1.03 0.97  CALCIUM 9.4 8.9 8.6   LFT  Recent Labs  08/14/13 0440  PROT 6.3  ALBUMIN 3.3*  AST 133*  ALT 96*  ALKPHOS 83  BILITOT 0.8    STUDIES: US Abdomen Complete  08/12/2013   CLINICAL DATA:  Abdominal pain.  Pancreatitis  EXAM: ULTRASOUND ABDOMEN COMPLETE  COMPARISON:  None.  FINDINGS: Gallbladder:  No gallstones or wall thickening visualized. No sonographic Murphy sign noted.  Common bile duct:  Diameter: 3.1 mm  Liver:  Diffuse increased echogenicity.  No focal lesion noted.  IVC:  No abnormality visualized.  Pancreas:  Visualized portion unremarkable.  Spleen:  Size and appearance within normal limits.  Right Kidney:  Length: 10.9 cm. Echogenicity within normal limits. No mass or hydronephrosis visualized.  Left Kidney:  Length: 10.1 cm. Echogenicity within normal limits. No mass or hydronephrosis visualized.  Abdominal aorta:  No aneurysm visualized.  Other findings:  None.  IMPRESSION: 1. No acute findings. 2. Fatty infiltration of the liver.   Electronically Signed   By: Signa Kell M.D.   On: 08/12/2013 20:43   Ct Abdomen Pelvis W Contrast  08/13/2013   CLINICAL DATA:  Elevated white blood cell count and lipase. Pancreatitis. History of ulcer. Nausea and vomiting for 4 months.  EXAM: CT ABDOMEN AND PELVIS WITH CONTRAST  TECHNIQUE: Multidetector CT imaging of the abdomen and pelvis was performed using the  standard protocol following bolus administration of intravenous contrast.  CONTRAST:  OMNIPAQUE IOHEXOL 300 MG/ML  SOLN  COMPARISON:  Ultrasound 08/12/2013 abdomen.  FINDINGS: Lower Chest: Patchy subsegmental atelectasis at the bases. Gynecomastia, incompletely imaged. Normal heart size without pericardial or pleural effusion.  Abdomen/Pelvis: Moderate to marked hepatic steatosis. This is somewhat heterogeneous, and more pronounced in the anterior segment right liver lobe. Hepatomegaly, greater than 20 cm craniocaudal. Normal spleen, stomach.  Mild edema is identified about the pancreatic head and uncinate process as well as throughout the remainder of the anterior pararenal space. Example image 38/series 2 and extending more inferiorly on image 51/series 2. Pancreas enhances normally, without evidence of peripancreatic fluid collection, pancreatic ductal dilatation.  No calcified gallstones or evidence of acute cholecystitis. No biliary ductal dilatation or evidence of choledocholithiasis.  All vascular structures enhance normally.  Normal adrenal glands and kidneys. No retroperitoneal or retrocrural adenopathy. The distal colon is underdistended. Apparent wall thickening is felt to be secondary. Example within the sigmoid on image 80/series 2. Normal terminal ileum and appendix. Appendix not visualized. Normal small bowel, without abdominal intraperitoneal fluid or free intraperitoneal air.  Small fat containing left inguinal hernia. No pelvic adenopathy. Normal urinary bladder and prostate. Trace cul-de-sac fluid which is likely secondary.  Bones/Musculoskeletal:  No acute osseous abnormality.  IMPRESSION: 1. Findings of uncomplicated mild pancreatitis, centered within the head and uncinate process. 2. Hepatic steatosis and hepatomegaly. 3. Apparent distal colonic thickening is favored to be due to underdistention. Difficult to exclude concurrent infectious colitis. 4. Small volume cul-de-sac fluid which is  likely secondary to the the pancreatic process. 5. Tiny fat containing left inguinal hernia. 6. Gynecomastia.   Electronically Signed   By: Jeronimo Greaves M.D.   On: 08/13/2013 19:43   Dg Chest Port 1 View  08/12/2013   CLINICAL DATA:  GI bleed.  Rule out free air  EXAM: PORTABLE CHEST - 1 VIEW  COMPARISON:  None.  FINDINGS: The heart size is normal and the vascularity is normal. Lungs are clear. No free air under the diaphragm.  IMPRESSION: No active disease.  Electronically Signed   By: Marlan Palau M.D.   On: 08/12/2013 16:40   PREVIOUS ENDOSCOPIES:            none   Impression / Plan:    38. 27 year old male with acute mild pancreatitis, likely ETOH induced. Still requiring frequent dilaudid but wants diet advanced from clears. Discussed with patient some possible consequences of ongoing ETOH abuse such as recurrent pancreatitis, chronic pancreatitis, liver disease). This is 3rd day on clears, lipase 226 today. Will try on full liquids tomorrow. His HCT, renal function and other parameters are not overly concerning.   2. Nausea, vomiting (severe month history), now associated with hematemesis. Suspect bleeding from erosive disease vrs mallory-weiss tear vrs PUD. No active bleeding at present. Repeat am CBC. Will change IV PPI to PO BID. May need EGD if has recurrent bleeding   3. History of opiod dependence / illicit drug use. HIV and hep A,B,C negative.  4. Diarrhea. Reports black stool but takes Pepto on regular basis, heme negative in ED. Patient gives longstanding history of constipation / diarrhea. 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 He denies anything unusal about current bowel habits. C-diff negative, remaining GI pathogen panel is pending. Questionable distal colonic wall thickening on CTscan, probably underdistention.   5. tobacco abuse  6. Hepatic steatosis, moderate to severe / hepatomegaly. He is aware that ETOH is likely contributing and that over time this can evolve into cirrhosis.    7.  Abnormal U/A. On Rocephin for ? UTI. His culture is negative at day 1.    Thanks   LOS: 2 days   Willette Cluster  08/14/2013, 11:10 AM    [facility] Attending  I have also seen and assessed the patient earlier and agree with the above note. He has acute pancreatitis as best we can tell - slow to improve - would continue conservative Rx as you are Do not think he has any colitis but has underdistended colon most likely. Also has steatosis and abnormal transaminase so probably consuming more EtOH than admitting (told me 3-4 drings x 2 nights/week and suspect pancreatitis from EtOH.  Iva Boop, MD, [facility] [phone] (pager) 08/14/2013 9:02 PM

## 2013-08-14 NOTE — Progress Notes (Signed)
Patient ID: Richard House, male   DOB: [birth year], 27 y.o.   MRN: [id number]  [facility] PROGRESS NOTE  Schneider Warchol VHQ:[id number] DOB: [birth year] DOA: 08/12/2013 PCP: [name], MD  Brief narrative: Brief narrative:  27 y.o. male with history of nausea and vomiting for the past 4 months. He has also been weaning himself off of methadone and finally weaned himself off last week. He has been having N/V daily for the past 4 months and vertigo, but for the past 2 days he has developed new onset epigastric pain. Initially he stated that this was associated with bloody vomit and melena, but his hemoccult in the ED was negative and his Hg was high. He has gotten 1 dose of dilaudid in the ED for pain, and phenergan has provided some relief of the nausea.   Principal Problem:  Abdominal pain  - related to acute pancreatitis (secondary to alcohol abuse) as indicated by elevated lipase and CT abdomen noted below - lipase is still elevated, GI input requested adnd we appreciate assistance  - continue supportive care with IVF, analgesia and antiemetics as needed  Active Problems:  Nausea with vomiting, diarrhea  - related to acute pancreatitis, better this AM  - C. Diff negative, stool culture and GI panel pending for now - supportive care as noted above  - appreciate GI assistance  Hypokalemia  - secondary to vomiting  - will continue to supplement and repeat BMP in AM  PSA (polysubstance abuse  - weaning off methadone  - spend extensive time with pt and mother to educate about ongoing alcohol use  - pt has been drinking to alleviate pain as he was trying to stay off methadone - concerns addressed and both appreciated our time and concerns Transaminitis  - in the pattern of alcohol abuse - Hep panel negative  - will check CMET in AM Leukocytosis  - secondary to principal problem  - urine culture negative, d/c Rocephin  HTN  - continue Clonidine and Lisinopril    Consultants:  GI Procedures/Studies:  US Abdomen  08/12/2013 1. No acute findings. 2. Fatty infiltration of the liver.  CXR 08/12/2013 No active disease.  CT abd w/c 12/29 Uncomplicated mild pancreatitis, hepatic steatosis and hepatomegaly. Gynecomastia.   Antibiotics:  Rocephin 12/29 --> 12/30  Code Status: Full  Family Communication: Pt and mother at bedside  Disposition Plan: Home when medically stable   HPI/Subjective: No events overnight.   Objective: Filed Vitals:   08/13/13 1833 08/13/13 2200 08/14/13 0600 08/14/13 1325  BP:  139/96 134/80 116/81  Pulse:  87 80 76  Temp:  98.2 F (36.8 C) 97.9 F (36.6 C) 98 F (36.7 C)  TempSrc:  Oral Oral Oral  Resp:  20 20 18   Height: 6' (1.829 m)     Weight: 108.228 kg (238 lb 9.6 oz)     SpO2:  96% 96% 96%    Intake/Output Summary (Last 24 hours) at 08/14/13 1345 Last data filed at 08/14/13 1327  Gross per 24 hour  Intake 4068.75 ml  Output   2226 ml  Net 1842.75 ml    Exam:   General:  Pt is alert, follows commands appropriately, not in acute distress  Cardiovascular: Regular rate and rhythm, S1/S2, no murmurs, no rubs, no gallops  Respiratory: Clear to auscultation bilaterally, no wheezing, no crackles, no rhonchi  Abdomen: Soft, tender in upper abdominal quadrants, non distended, bowel sounds present, no guarding  Extremities: No edema, pulses DP and PT palpable bilaterally  Neuro: Grossly nonfocal  Data Reviewed: Basic Metabolic Panel:  Recent Labs Lab 08/12/13 1705 08/13/13 0613 08/14/13 0440  NA 136 138 141  K 3.7 3.2* 3.6*  CL 95* 101 104  CO2 21 22 24   GLUCOSE 121* 107* 86  BUN 8 8 6   CREATININE 0.99 1.03 0.97  CALCIUM 9.4 8.9 8.6   Liver Function Tests:  Recent Labs Lab 08/12/13 1705 08/14/13 0440  AST 91* 133*  ALT 131* 96*  ALKPHOS 119* 83  BILITOT 1.1 0.8  PROT 8.0 6.3  ALBUMIN 4.4 3.3*    Recent Labs Lab 08/12/13 1705 08/13/13 0615 08/14/13 0440  LIPASE 208* 176*  226*   CBC:  Recent Labs Lab 08/12/13 1705 08/13/13 0613 08/14/13 0440  WBC 15.4* 8.7 6.9  NEUTROABS 13.6*  --   --   HGB 17.6* 15.5 14.0  HCT 48.0 42.9 39.8  MCV 91.8 92.7 94.3  PLT 241 187 169    Recent Results (from the past 240 hour(s))  URINE CULTURE     Status: None   Collection Time    08/13/13  9:36 AM      Result Value Range Status   Specimen Description URINE, CLEAN CATCH   Final   Special Requests NONE   Final   Culture  Setup Time     Final   Value: 08/13/2013 13:43     Performed at [facility] Count     Final   Value: NO GROWTH     Performed at [facility]   Culture     Final   Value: NO GROWTH     Performed at [facility]   Report Status 08/14/2013 FINAL   Final  CLOSTRIDIUM DIFFICILE BY PCR     Status: None   Collection Time    08/14/13  9:06 AM      Result Value Range Status   C difficile by pcr NEGATIVE  NEGATIVE Final   Comment: Performed at [facility]     Scheduled Meds: . cefTRIAXone (ROCEPHIN)  IV  1 g Intravenous Q24H  . cloNIDine  0.1 mg Oral BID  . lisinopril  10 mg Oral Daily  . multivitamin with minerals  1 tablet Oral Daily  . pantoprazole  40 mg Oral BID AC   Continuous Infusions: . sodium chloride 125 mL/hr at 08/13/13 2247   Debbora Presto, MD  [facility] Pager [phone]  If 7PM-7AM, please contact night-coverage [email or web address] Password [email or web address] 08/14/2013, 1:45 PM   LOS: 2 days

## 2013-08-15 DIAGNOSIS — I10 Essential (primary) hypertension: Secondary | ICD-10-CM

## 2013-08-15 LAB — COMPREHENSIVE METABOLIC PANEL
ALT: 82 U/L — ABNORMAL HIGH (ref 0–53)
Albumin: 3.1 g/dL — ABNORMAL LOW (ref 3.5–5.2)
Alkaline Phosphatase: 74 U/L (ref 39–117)
CO2: 23 mEq/L (ref 19–32)
Chloride: 104 mEq/L (ref 96–112)
Creatinine, Ser: 0.85 mg/dL (ref 0.50–1.35)
GFR calc Af Amer: >90 mL/min (ref >90)
Glucose, Bld: 108 mg/dL — ABNORMAL HIGH (ref 70–99)
Potassium: 3.2 mEq/L — ABNORMAL LOW (ref 3.7–5.3)
Sodium: 139 mEq/L (ref 137–147)
Total Bilirubin: 0.5 mg/dL (ref 0.3–1.2)
Total Protein: 6 g/dL (ref 6.0–8.3)

## 2013-08-15 LAB — CBC
Hemoglobin: 13 g/dL (ref 13.0–17.0)
MCH: 33.1 pg (ref 26.0–34.0)
MCHC: 34.6 g/dL (ref 30.0–36.0)
RBC: 3.93 MIL/uL — ABNORMAL LOW (ref 4.22–5.81)
WBC: 7.6 10*3/uL (ref 4.0–10.5)

## 2013-08-15 LAB — MAGNESIUM: Magnesium: 1.9 mg/dL (ref 1.5–2.5)

## 2013-08-15 MED ORDER — ENOXAPARIN SODIUM 60 MG/0.6ML ~~LOC~~ SOLN
50.0000 mg | SUBCUTANEOUS | Status: DC
  Administered 2013-08-15 – 2013-08-19 (×5): 50 mg via SUBCUTANEOUS
  Filled 2013-08-15 (×6): qty 0.6

## 2013-08-15 MED ORDER — POTASSIUM CHLORIDE CRYS ER 20 MEQ PO TBCR
40.0000 meq | EXTENDED_RELEASE_TABLET | Freq: Every day | ORAL | Status: DC
  Administered 2013-08-15 – 2013-08-18 (×4): 40 meq via ORAL
  Filled 2013-08-15 (×4): qty 2

## 2013-08-15 MED ORDER — HYDROMORPHONE HCL PF 1 MG/ML IJ SOLN
1.0000 mg | INTRAMUSCULAR | Status: DC | PRN
  Administered 2013-08-15 – 2013-08-17 (×12): 2 mg via INTRAVENOUS
  Filled 2013-08-15 (×12): qty 2

## 2013-08-15 MED ORDER — KCL IN DEXTROSE-NACL 40-5-0.45 MEQ/L-%-% IV SOLN
INTRAVENOUS | Status: DC
  Administered 2013-08-15 – 2013-08-16 (×3): via INTRAVENOUS
  Administered 2013-08-17: 20 mL via INTRAVENOUS
  Filled 2013-08-15 (×5): qty 1000

## 2013-08-15 NOTE — Progress Notes (Signed)
ANTICOAGULATION CONSULT NOTE - Initial Consult  Pharmacy Consult for Lovenox Indication: VTE prophylaxis  No Known Allergies  Patient Measurements: Height: 6' (182.9 cm) Weight: 238 lb 9.6 oz (108.228 kg) IBW/kg (Calculated) : 77.6  Vital Signs: Temp: 97.7 F (36.5 C) (12/31 0617) Temp src: Oral (12/31 0617) BP: 128/89 mmHg (12/31 0617) Pulse Rate: 82 (12/31 0617)  Labs:  Recent Labs  08/13/13 0613 08/14/13 0440 08/15/13 0520  HGB 15.5 14.0 13.0  HCT 42.9 39.8 37.6*  PLT 187 169 155  CREATININE 1.03 0.97 0.85    Estimated Creatinine Clearance: 162.9 ml/min (by C-G formula based on Cr of 0.85).   Medical History: Past Medical History  Diagnosis Date  . Hypertension   . Vertigo   . Ulcer   . Pancreatitis, acute 08/14/2013    Medications:  Scheduled:  . cloNIDine  0.1 mg Oral BID  . enoxaparin (LOVENOX) injection  50 mg Subcutaneous Q24H  . lisinopril  10 mg Oral Daily  . multivitamin with minerals  1 tablet Oral Daily  . pantoprazole  40 mg Oral BID AC  . potassium chloride  40 mEq Oral Daily   Infusions:  . dextrose 5 % and 0.45 % NaCl with KCl 40 mEq/L 100 mL/hr at 08/15/13 0835   PRN: bismuth subsalicylate, gi cocktail, HYDROmorphone (DILAUDID) injection, promethazine, sodium chloride  Assessment: 27 y/o M with acute pancreatitis, to begin Lovenox for VTE prophylaxis.  Weight 108.2kg, SCr normal and estimated CrCl > 100 mL/min. No history of bleeding disorders.  Goal of Therapy:  Prophylactic-dose LMWH, adjusted for obesity Monitor platelets by anticoagulation protocol: Yes   Plan:  1.  Lovenox 50 mg SQ q24h 2.  Follow pltc, H/H, monitor for evidence of bleeding.  Elie Goody, PharmD, BCPS Pager: [phone] 08/15/2013  3:00 PM

## 2013-08-15 NOTE — Progress Notes (Signed)
[facility] PROGRESS NOTE  Richard House ZOX:[id number] DOB: 1986/01/26 DOA: 08/12/2013 PCP: Richard Lewandowsky, MD  Brief narrative:  27 y.o. male with history of nausea and vomiting for the past 4 months. He has also been weaning himself off of methadone and finally weaned himself off last week. He has been having N/V daily for the past 4 months and vertigo, but for the past 2 days he has developed new onset epigastric pain. Initially he stated that this was associated with bloody vomit and melena, but his hemoccult in the ED was negative and his Hg was high. He has gotten 1 dose of dilaudid in the ED for pain, and phenergan has provided some relief of the nausea.   Assessment/Plan  Acute alcoholic pancreatitis  - appreciate GI assistance - advance diet as tolerated  - continue supportive care with IVF, analgesia and antiemetics as needed  -  Will increase dilaudid to 1-2 mg q4h   Nausea with vomiting, diarrhea  - improving - C. Diff negative, stool culture pending -  GI panel neg - supportive care as noted above  - appreciate GI assistance   Hypokalemia  -  Add potassium to IVF -  Continue oral potassium once daily -  Check magnesium level  PSA (polysubstance abuse  - weaning off methadone  - My colleague spent extensive time with pt and mother to educate about ongoing alcohol use  - pt has been drinking to alleviate pain as he was trying to stay off methadone   Transaminitis trending down.  Bilirubin has remained normal - in the pattern of alcohol abuse  - Hep panel negative   Leukocytosis due to pancreatitis, resolving  HTN BP stable - continue Clonidine and Lisinopril   Diet:  Full liquid Access:  PIV IVF:  yes Proph:  lovenox  Code Status: full Family Communication: patient alone Disposition Plan: pending tolerating diet, pain controlled on oral pain medications   Consultants:  GI Procedures/Studies:  US Abdomen 08/12/2013 1. No acute findings. 2.  Fatty infiltration of the liver.  CXR 08/12/2013 No active disease.  CT abd w/c 12/29 Uncomplicated mild pancreatitis, hepatic steatosis and hepatomegaly. Gynecomastia.  Antibiotics:  Rocephin 12/29 --> 12/30   HPI/Subjective:  Persistent abdominal pain that is better than when he was admitted.  Occasional radiation to back.    Objective: Filed Vitals:   08/14/13 0600 08/14/13 1325 08/14/13 2100 08/15/13 0617  BP: 134/80 116/81 133/91 128/89  Pulse: 80 76 75 82  Temp: 97.9 F (36.6 C) 98 F (36.7 C) 98.2 F (36.8 C) 97.7 F (36.5 C)  TempSrc: Oral Oral Oral Oral  Resp: 20 18 18 20   Height:      Weight:      SpO2: 96% 96% 98% 98%    Intake/Output Summary (Last 24 hours) at 08/15/13 1430 Last data filed at 08/15/13 0900  Gross per 24 hour  Intake      0 ml  Output   2025 ml  Net  -2025 ml   Filed Weights   08/13/13 1833  Weight: 108.228 kg (238 lb 9.6 oz)    Exam:   General:  CM, No acute distress  HEENT:  NCAT, MMM  Cardiovascular:  RRR, nl S1, S2 no mrg, 2+ pulses, warm extremities  Respiratory:  CTAB, no increased WOB  Abdomen:   NABS, soft, ND, mild TTP in epigastrium and just to the right of the epigastrium without rebound or guarding  MSK:   Normal tone and bulk, no  LEE  Neuro:  Grossly intact  Data Reviewed: Basic Metabolic Panel:  Recent Labs Lab 08/12/13 1705 08/13/13 0613 08/14/13 0440 08/15/13 0520  NA 136 138 141 139  K 3.7 3.2* 3.6* 3.2*  CL 95* 101 104 104  CO2 21 22 24 23   GLUCOSE 121* 107* 86 108*  BUN 8 8 6  3*  CREATININE 0.99 1.03 0.97 0.85  CALCIUM 9.4 8.9 8.6 8.5   Liver Function Tests:  Recent Labs Lab 08/12/13 1705 08/14/13 0440 08/15/13 0520  AST 91* 133* 84*  ALT 131* 96* 82*  ALKPHOS 119* 83 74  BILITOT 1.1 0.8 0.5  PROT 8.0 6.3 6.0  ALBUMIN 4.4 3.3* 3.1*    Recent Labs Lab 08/12/13 1705 08/13/13 0615 08/14/13 0440 08/15/13 0520  LIPASE 208* 176* 226* 237*   No results found for this basename:  AMMONIA,  in the last 168 hours CBC:  Recent Labs Lab 08/12/13 1705 08/13/13 0613 08/14/13 0440 08/15/13 0520  WBC 15.4* 8.7 6.9 7.6  NEUTROABS 13.6*  --   --   --   HGB 17.6* 15.5 14.0 13.0  HCT 48.0 42.9 39.8 37.6*  MCV 91.8 92.7 94.3 95.7  PLT 241 187 169 155   Cardiac Enzymes: No results found for this basename: CKTOTAL, CKMB, CKMBINDEX, TROPONINI,  in the last 168 hours BNP (last 3 results) No results found for this basename: PROBNP,  in the last 8760 hours CBG: No results found for this basename: GLUCAP,  in the last 168 hours  Recent Results (from the past 240 hour(s))  URINE CULTURE     Status: None   Collection Time    08/13/13  9:36 AM      Result Value Range Status   Specimen Description URINE, CLEAN CATCH   Final   Special Requests NONE   Final   Culture  Setup Time     Final   Value: 08/13/2013 13:43     Performed at [facility] Count     Final   Value: NO GROWTH     Performed at [facility]   Culture     Final   Value: NO GROWTH     Performed at [facility]   Report Status 08/14/2013 FINAL   Final  CLOSTRIDIUM DIFFICILE BY PCR     Status: None   Collection Time    08/14/13  9:06 AM      Result Value Range Status   C difficile by pcr NEGATIVE  NEGATIVE Final   Comment: Performed at [facility]  STOOL CULTURE     Status: None   Collection Time    08/14/13  9:06 AM      Result Value Range Status   Specimen Description STOOL   Final   Special Requests NONE   Final   Culture     Final   Value: Culture reincubated for better growth     Performed at [facility]   Report Status PENDING   Incomplete     Studies: Ct Abdomen Pelvis W Contrast  08/13/2013   CLINICAL DATA:  Elevated white blood cell count and lipase. Pancreatitis. History of ulcer. Nausea and vomiting for 4 months.  EXAM: CT ABDOMEN AND PELVIS WITH CONTRAST  TECHNIQUE: Multidetector CT imaging of the abdomen and pelvis was performed  using the standard protocol following bolus administration of intravenous contrast.  CONTRAST:  OMNIPAQUE IOHEXOL 300 MG/ML  SOLN  COMPARISON:  Ultrasound 08/12/2013  abdomen.  FINDINGS: Lower Chest: Patchy subsegmental atelectasis at the bases. Gynecomastia, incompletely imaged. Normal heart size without pericardial or pleural effusion.  Abdomen/Pelvis: Moderate to marked hepatic steatosis. This is somewhat heterogeneous, and more pronounced in the anterior segment right liver lobe. Hepatomegaly, greater than 20 cm craniocaudal. Normal spleen, stomach.  Mild edema is identified about the pancreatic head and uncinate process as well as throughout the remainder of the anterior pararenal space. Example image 38/series 2 and extending more inferiorly on image 51/series 2. Pancreas enhances normally, without evidence of peripancreatic fluid collection, pancreatic ductal dilatation.  No calcified gallstones or evidence of acute cholecystitis. No biliary ductal dilatation or evidence of choledocholithiasis.  All vascular structures enhance normally.  Normal adrenal glands and kidneys. No retroperitoneal or retrocrural adenopathy. The distal colon is underdistended. Apparent wall thickening is felt to be secondary. Example within the sigmoid on image 80/series 2. Normal terminal ileum and appendix. Appendix not visualized. Normal small bowel, without abdominal intraperitoneal fluid or free intraperitoneal air.  Small fat containing left inguinal hernia. No pelvic adenopathy. Normal urinary bladder and prostate. Trace cul-de-sac fluid which is likely secondary.  Bones/Musculoskeletal:  No acute osseous abnormality.  IMPRESSION: 1. Findings of uncomplicated mild pancreatitis, centered within the head and uncinate process. 2. Hepatic steatosis and hepatomegaly. 3. Apparent distal colonic thickening is favored to be due to underdistention. Difficult to exclude concurrent infectious colitis. 4. Small volume cul-de-sac  fluid which is likely secondary to the the pancreatic process. 5. Tiny fat containing left inguinal hernia. 6. Gynecomastia.   Electronically Signed   By: Jeronimo Greaves M.D.   On: 08/13/2013 19:43    Scheduled Meds: . cloNIDine  0.1 mg Oral BID  . lisinopril  10 mg Oral Daily  . multivitamin with minerals  1 tablet Oral Daily  . pantoprazole  40 mg Oral BID AC   Continuous Infusions: . dextrose 5 % and 0.45 % NaCl with KCl 40 mEq/L 100 mL/hr at 08/15/13 1610    Principal Problem:   Abdominal pain Active Problems:   Nausea with vomiting   Abdominal  pain, other specified site   Pancreatitis, acute   Hepatic steatosis    Time spent: 30 min    Juelle Dickmann, [facility]  [facility] Pager [phone]. If 7PM-7AM, please contact night-coverage at [email or web address], password [facility] 08/15/2013, 2:30 PM  LOS: 3 days

## 2013-08-15 NOTE — Progress Notes (Signed)
    Progress Note   Subjective  persistent upper abdominal pain   Objective   Vital signs in last 24 hours: Temp:  [97.7 F (36.5 C)-98.2 F (36.8 C)] 97.7 F (36.5 C) (12/31 0617) Pulse Rate:  [75-82] 82 (12/31 0617) Resp:  [18-20] 20 (12/31 0617) BP: (116-133)/(81-91) 128/89 mmHg (12/31 0617) SpO2:  [96 %-98 %] 98 % (12/31 0617) Last BM Date: 08/14/13 General:    white male in NAD Heart:  Regular rate and rhythm Abdomen:  Soft, nondistended, mild-moderate mid upper tenderness. Bowel sounds on hypoactive side. Neurologic:  Alert and oriented,  grossly normal neurologically. Psych:  Cooperative. Normal mood and affect.   Lab Results:  Recent Labs  08/13/13 0613 08/14/13 0440 08/15/13 0520  WBC 8.7 6.9 7.6  HGB 15.5 14.0 13.0  HCT 42.9 39.8 37.6*  PLT 187 169 155   BMET  Recent Labs  08/13/13 0613 08/14/13 0440 08/15/13 0520  NA 138 141 139  K 3.2* 3.6* 3.2*  CL 101 104 104  CO2 22 24 23   GLUCOSE 107* 86 108*  BUN 8 6 3*  CREATININE 1.03 0.97 0.85  CALCIUM 8.9 8.6 8.5   LFT  Recent Labs  08/15/13 0520  PROT 6.0  ALBUMIN 3.1*  AST 84*  ALT 82*  ALKPHOS 74  BILITOT 0.5     Assessment / Plan:    25. 27 year old male with acute mild pancreatitis, likely ETOH induced. Still having upper abdominal pain though not to degree as when admitted. Phenergan helps nausea.   His HCT, renal function and other parameters are not overly concerning. Will advance diet to fulls and see how he does. Reiterated need to avoid ETOH in future.   2. Nausea, vomiting (severe month history), now associated with hematemesis. Suspect bleeding from erosive disease vrs mallory-weiss tear vrs PUD. No active bleeding at present. Hgb 13 today.  Continue BID PPI   3. History of opiod dependence / illicit drug use. HIV and hep A,B,C negative.   4. Diarrhea. Reports black stool but takes Pepto on regular basis, heme negative in ED. Patient gives longstanding history of constipation /  diarrhea. 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 He denies anything unusal about current bowel habits. C-diff and GI pathogen panel negative. Questionable distal colonic wall thickening on CTscan, probably underdistention.   5. tobacco abuse   6. Hepatic steatosis, moderate to severe / hepatomegaly. He is aware that ETOH is likely contributing and that over time this can evolve into cirrhosis.   7. Hypokalemia, K= 3.2.  Can probably replete orally, will defer to primary team     LOS: 3 days   Willette Cluster  08/15/2013, 8:57 AM   [facility] Attending  I have also seen and assessed the patient and agree with the above note. Dealing with low pain threshold, alcoholic pancreatitis and fatty liver. Advance diet and continue to emphasize abstinence from EtOH.  Iva Boop, MD, [facility] [phone] (pager) 08/15/2013 1:47 PM

## 2013-08-16 LAB — BASIC METABOLIC PANEL
BUN: <3 mg/dL — ABNORMAL LOW (ref 6–23)
CO2: 24 mEq/L (ref 19–32)
Calcium: 8.7 mg/dL (ref 8.4–10.5)
Chloride: 103 mEq/L (ref 96–112)
Creatinine, Ser: 0.91 mg/dL (ref 0.50–1.35)
GFR calc Af Amer: >90 mL/min (ref >90)
GFR calc non Af Amer: >90 mL/min (ref >90)
Glucose, Bld: 91 mg/dL (ref 70–99)
Potassium: 3.9 mEq/L (ref 3.7–5.3)
Sodium: 141 mEq/L (ref 137–147)

## 2013-08-16 NOTE — Progress Notes (Signed)
    Progress Note   Subjective  I awoke him from sleep. Had pain early this am, better with pain meds.    Objective   Vital signs in last 24 hours: Temp:  [98 F (36.7 C)-98.2 F (36.8 C)] 98.2 F (36.8 C) (12/31 2054) Pulse Rate:  [72-85] 72 (01/01 0602) Resp:  [18-20] 20 (01/01 0602) BP: (131-132)/(77-95) 131/83 mmHg (01/01 0602) SpO2:  [93 %-100 %] 93 % (01/01 0602) Last BM Date: 08/14/13 General:    white male in NAD Abdomen:  Soft, mild epigastric tenderness. Normal bowel sounds. Neurologic:  Alert and oriented,  grossly normal neurologically. Psych:  Cooperative. Normal mood and affect.  Lab Results:  Recent Labs  08/14/13 0440 08/15/13 0520  WBC 6.9 7.6  HGB 14.0 13.0  HCT 39.8 37.6*  PLT 169 155   BMET  Recent Labs  08/14/13 0440 08/15/13 0520 08/16/13 0555  NA 141 139 141  K 3.6* 3.2* 3.9  CL 104 104 103  CO2 24 23 24   GLUCOSE 86 108* 91  BUN 6 3* <3*  CREATININE 0.97 0.85 0.91  CALCIUM 8.6 8.5 8.7   LFT  Recent Labs  08/15/13 0520  PROT 6.0  ALBUMIN 3.1*  AST 84*  ALT 82*  ALKPHOS 74  BILITOT 0.5     Assessment / Plan:   1. acute mild pancreatitis, likely ETOH induced. Dilaudid dose was increased yesterday. Phenergan helps nausea. Patient really wants to eat. Will advance to low fat diet but we need to start transition him to oral pain meds very soon.    2. Nausea, vomiting, hematemesis.  Just nausea now, no vomiting. Taking phenergan. Continue PO BID PPI   3. History of opiod dependence / illicit drug use. HIV and hep A,B,C negative. He may be difficult to get off current narcotics.    5. tobacco abuse   6. Hepatic steatosis, moderate to severe / hepatomegaly. He is aware that ETOH is likely contributing and that over time this can evolve into cirrhosis.     LOS: 4 days   Tye Savoy  08/16/2013, 11:04 AM   [facility] Attending  I  agree with the above note. Gatha Mayer, MD, [facility] [phone]  (pager) 08/16/2013 4:58 PM

## 2013-08-16 NOTE — Progress Notes (Signed)
[facility] PROGRESS NOTE  Richard House GXQ:[id number] DOB: 02-29-1986 DOA: 08/12/2013 PCP: [name], MD  Brief narrative:  28 y.o. male with history of nausea and vomiting for the past 4 months. He has also been weaning himself off of methadone and finally weaned himself off last week. He has been having N/V daily for the past 4 months and vertigo, but for the past 2 days he has developed new onset epigastric pain. Initially he stated that this was associated with bloody vomit and melena, but his hemoccult in the ED was negative and his Hg was high. He has gotten 1 dose of dilaudid in the ED for pain, and phenergan has provided some relief of the nausea.   Assessment/Plan  Acute alcoholic pancreatitis, pain improved on increased dilaudid yesterday, likely has high pain tolerance due to previous drug abuse. - appreciate GI assistance - advance diet to low fat - plan to transition to oral pain medication this afternoon if patient tolerates lunch without vomiting/worsening pain  Nausea with vomiting, diarrhea.  Diarrhea improved.  Denies vomiting.   - C. Diff negative - Stool culture pending - GI panel neg  Hypokalemia  -  Continue oral potassium once daily and potassium in IVF -  Magnesium level 1.9  PSA (polysubstance abuse, HIV, Hep A, B, C neg. - weaned off methadone about one week ago -  Will touch base with pain center tomorrow to determine what regimen they prefer he be discharged on for pain management.  Transaminitis and hepatomegaly likely due to EtOH abuse, improving.   Leukocytosis due to pancreatitis, resolving  HTN BP stable, continue Clonidine and Lisinopril   Diet:  Full liquid Access:  PIV IVF:  yes Proph:  lovenox  Code Status: full Family Communication: patient alone Disposition Plan: pending tolerating diet, pain controlled on oral pain medications   Consultants:  GI Procedures/Studies:  US Abdomen 08/12/2013 1. No acute findings. 2.  Fatty infiltration of the liver.  CXR 08/12/2013 No active disease.  CT abd w/c 14/48 Uncomplicated mild pancreatitis, hepatic steatosis and hepatomegaly. Gynecomastia.  Antibiotics:  Rocephin 12/29 --> 12/30   HPI/Subjective:  Persistent abdominal pain that is better than when he was admitted.  Occasional radiation to back.    Objective: Filed Vitals:   08/15/13 0617 08/15/13 1502 08/15/13 2054 08/16/13 0602  BP: 128/89 132/95 132/77 131/83  Pulse: 82 85 73 72  Temp: 97.7 F (36.5 C) 98 F (36.7 C) 98.2 F (36.8 C)   TempSrc: Oral Oral Oral Oral  Resp: 20 18 18 20   Height:      Weight:      SpO2: 98% 97% 100% 93%    Intake/Output Summary (Last 24 hours) at 08/16/13 1230 Last data filed at 08/15/13 1503  Gross per 24 hour  Intake   1160 ml  Output    526 ml  Net    634 ml   Filed Weights   08/13/13 1833  Weight: 108.228 kg (238 lb 9.6 oz)    Exam:   General:  CM, No acute distress  HEENT:  NCAT, MMM  Cardiovascular:  RRR, nl S1, S2 no mrg, 2+ pulses, warm extremities  Respiratory:  CTAB, no increased WOB  Abdomen:   NABS, soft, ND, mild TTP in epigastrium and just to the right of the epigastrium without rebound or guarding, stable from yesterday  MSK:   Normal tone and bulk, no LEE  Neuro:  Grossly intact  Data Reviewed: Basic Metabolic Panel:  Recent Labs  Lab 08/12/13 1705 08/13/13 0613 08/14/13 0440 08/15/13 0520 08/15/13 1550 08/16/13 0555  NA 136 138 141 139  --  141  K 3.7 3.2* 3.6* 3.2*  --  3.9  CL 95* 101 104 104  --  103  CO2 21 22 24 23   --  24  GLUCOSE 121* 107* 86 108*  --  91  BUN 8 8 6  3*  --  <3*  CREATININE 0.99 1.03 0.97 0.85  --  0.91  CALCIUM 9.4 8.9 8.6 8.5  --  8.7  MG  --   --   --   --  1.9  --    Liver Function Tests:  Recent Labs Lab 08/12/13 1705 08/14/13 0440 08/15/13 0520  AST 91* 133* 84*  ALT 131* 96* 82*  ALKPHOS 119* 83 74  BILITOT 1.1 0.8 0.5  PROT 8.0 6.3 6.0  ALBUMIN 4.4 3.3* 3.1*    Recent  Labs Lab 08/12/13 1705 08/13/13 0615 08/14/13 0440 08/15/13 0520  LIPASE 208* 176* 226* 237*   No results found for this basename: AMMONIA,  in the last 168 hours CBC:  Recent Labs Lab 08/12/13 1705 08/13/13 0613 08/14/13 0440 08/15/13 0520  WBC 15.4* 8.7 6.9 7.6  NEUTROABS 13.6*  --   --   --   HGB 17.6* 15.5 14.0 13.0  HCT 48.0 42.9 39.8 37.6*  MCV 91.8 92.7 94.3 95.7  PLT 241 187 169 155   Cardiac Enzymes: No results found for this basename: CKTOTAL, CKMB, CKMBINDEX, TROPONINI,  in the last 168 hours BNP (last 3 results) No results found for this basename: PROBNP,  in the last 8760 hours CBG: No results found for this basename: GLUCAP,  in the last 168 hours  Recent Results (from the past 240 hour(s))  URINE CULTURE     Status: None   Collection Time    08/13/13  9:36 AM      Result Value Range Status   Specimen Description URINE, CLEAN CATCH   Final   Special Requests NONE   Final   Culture  Setup Time     Final   Value: 08/13/2013 13:43     Performed at [place]     Final   Value: NO GROWTH     Performed at [facility]   Culture     Final   Value: NO GROWTH     Performed at [facility]   Report Status 08/14/2013 FINAL   Final  CLOSTRIDIUM DIFFICILE BY PCR     Status: None   Collection Time    08/14/13  9:06 AM      Result Value Range Status   C difficile by pcr NEGATIVE  NEGATIVE Final   Comment: Performed at [place]     Status: None   Collection Time    08/14/13  9:06 AM      Result Value Range Status   Specimen Description STOOL   Final   Special Requests NONE   Final   Culture     Final   Value: NO SUSPICIOUS COLONIES, CONTINUING TO HOLD     Note: REDUCED NORMAL FLORA PRESENT     Performed at [facility]   Report Status PENDING   Incomplete     Studies: No results found.  Scheduled Meds: . cloNIDine  0.1 mg Oral BID  . enoxaparin (LOVENOX) injection  50 mg  Subcutaneous Q24H  .  lisinopril  10 mg Oral Daily  . multivitamin with minerals  1 tablet Oral Daily  . pantoprazole  40 mg Oral BID AC  . potassium chloride  40 mEq Oral Daily   Continuous Infusions: . dextrose 5 % and 0.45 % NaCl with KCl 40 mEq/L 100 mL/hr at 08/16/13 0346    Principal Problem:   Abdominal pain Active Problems:   Nausea with vomiting   Abdominal  pain, other specified site   Pancreatitis, acute   Hepatic steatosis    Time spent: 30 min    Karlene Southard, [facility]  [facility] Pager [phone]. If 7PM-7AM, please contact night-coverage at [email or web address], password [facility] 08/16/2013, 12:30 PM  LOS: 4 days

## 2013-08-17 LAB — CBC
HCT: 42.8 % (ref 39.0–52.0)
Hemoglobin: 14.8 g/dL (ref 13.0–17.0)
MCH: 33.1 pg (ref 26.0–34.0)
MCHC: 34.6 g/dL (ref 30.0–36.0)
MCV: 95.7 fL (ref 78.0–100.0)
PLATELETS: 194 10*3/uL (ref 150–400)
RBC: 4.47 MIL/uL (ref 4.22–5.81)
RDW: 13.8 % (ref 11.5–15.5)
WBC: 8.2 10*3/uL (ref 4.0–10.5)

## 2013-08-17 LAB — STOOL CULTURE

## 2013-08-17 MED ORDER — OXYCODONE HCL 5 MG PO TABS
10.0000 mg | ORAL_TABLET | ORAL | Status: DC
  Administered 2013-08-17 – 2013-08-18 (×5): 10 mg via ORAL
  Filled 2013-08-17 (×5): qty 2

## 2013-08-17 NOTE — Progress Notes (Signed)
ANTICOAGULATION CONSULT NOTE - Follow up  Pharmacy Consult for Lovenox Indication: VTE prophylaxis  No Known Allergies  Patient Measurements: Height: 6' (182.9 cm) Weight: 238 lb 9.6 oz (108.228 kg) IBW/kg (Calculated) : 77.6  Vital Signs: Temp: 97.7 F (36.5 C) (01/02 0512) Temp src: Oral (01/02 0512) BP: 124/80 mmHg (01/02 0951) Pulse Rate: 87 (01/02 0512)  Labs:  Recent Labs  08/15/13 0520 08/16/13 0555 08/17/13 0431  HGB 13.0  --  14.8  HCT 37.6*  --  42.8  PLT 155  --  194  CREATININE 0.85 0.91  --     Estimated Creatinine Clearance: 152.1 ml/min (by C-G formula based on Cr of 0.91).   Medical History: Past Medical History  Diagnosis Date  . Hypertension   . Vertigo   . Ulcer   . Pancreatitis, acute 08/14/2013    Medications:  Scheduled:  . cloNIDine  0.1 mg Oral BID  . enoxaparin (LOVENOX) injection  50 mg Subcutaneous Q24H  . lisinopril  10 mg Oral Daily  . multivitamin with minerals  1 tablet Oral Daily  . pantoprazole  40 mg Oral BID AC  . potassium chloride  40 mEq Oral Daily   Infusions:  . dextrose 5 % and 0.45 % NaCl with KCl 40 mEq/L 50 mL/hr at 08/16/13 1730   PRN: bismuth subsalicylate, gi cocktail, HYDROmorphone (DILAUDID) injection, promethazine, sodium chloride  Assessment: 28 y/o M with acute pancreatitis, on Lovenox for VTE prophylaxis.  Using dosage of 50mg  SQ q24h (approximately 0.5mg /kg/day).  08/17/13: H/H and pltc stable, WNL.  No bleeding reported.  Goal of Therapy:  Prophylactic-dose LMWH, adjusted for obesity Monitor platelets by anticoagulation protocol: Yes   Plan:  1.  Continue Lovenox 50 mg SQ q24h 2.  Follow pltc, H/H, monitor for evidence of bleeding.  Clayburn Pert, PharmD, BCPS Pager: [phone] 08/17/2013  10:06 AM

## 2013-08-17 NOTE — Progress Notes (Signed)
    Progress Note   Subjective  tolerated low fat diet without any increase in abdominal pain   Objective   Vital signs in last 24 hours: Temp:  [97.7 F (36.5 C)-98.1 F (36.7 C)] 97.7 F (36.5 C) (01/02 0512) Pulse Rate:  [77-87] 87 (01/02 0512) Resp:  [18-20] 18 (01/02 0512) BP: (124-149)/(80-99) 124/80 mmHg (01/02 0951) SpO2:  [96 %-98 %] 96 % (01/02 0512) Last BM Date: 08/17/13 General:    white male in NAD Heart:  Regular rate and rhythm Abdomen:  Soft, nondistended, mild epigastric tenderness. Hypoactive bowel sounds. Extremities:  Without edema. Neurologic:  Alert and oriented,  grossly normal neurologically. Psych:  Cooperative.     Lab Results:  Recent Labs  08/15/13 0520 08/17/13 0431  WBC 7.6 8.2  HGB 13.0 14.8  HCT 37.6* 42.8  PLT 155 194   BMET  Recent Labs  08/15/13 0520 08/16/13 0555  NA 139 141  K 3.2* 3.9  CL 104 103  CO2 23 24  GLUCOSE 108* 91  BUN 3* <3*  CREATININE 0.85 0.91  CALCIUM 8.5 8.7   LFT  Recent Labs  08/15/13 0520  PROT 6.0  ALBUMIN 3.1*  AST 84*  ALT 82*  ALKPHOS 74  BILITOT 0.5     Assessment / Plan:   1. acute mild pancreatitis, likely ETOH induced. Tolerating low fat diet. His pain meds were changed to oral today. Hopefully home soon. He can follow up with [place] prn.Marland Kitchen ETOH abstinence is paramount.   2. Nausea, vomiting, hematemesis.  Resolved. Hgb 14.8  3. History of opiod dependence / illicit drug use. HIV and hep A,B,C negative. He may be difficult to get off current narcotics.  5. tobacco abuse   6. Hepatic steatosis, moderate to severe / hepatomegaly. He is aware that ETOH is likely contributing and that over time this can evolve into cirrhosis   LOS: 5 days   Tye Savoy  08/17/2013, 10:37 AM   Agree with Ms. Vanita Ingles assessment and plan. Gatha Mayer, MD, [facility]

## 2013-08-17 NOTE — Progress Notes (Signed)
[facility] PROGRESS NOTE  Richard House DGL:[id number] DOB: 1986/07/11 DOA: 08/12/2013 PCP: [name], MD  Brief narrative:  28 y.o. male with history of nausea and vomiting for the past 4 months. He has also been weaning himself off of methadone and finally weaned himself off last week. He has been having N/V daily for the past 4 months and vertigo, but for the past 2 days he has developed new onset epigastric pain. Initially he stated that this was associated with bloody vomit and melena, but his hemoccult in the ED was negative and his Hg was high. He has gotten 1 dose of dilaudid in the ED for pain, and phenergan has provided some relief of the nausea.   Assessment/Plan  Acute alcoholic pancreatitis, has high pain medication tolerance due to previous drug abuse. - appreciate GI assistance - continue low fat diet -  Transition to oral pain medication today  Nausea with vomiting, diarrhea.  Diarrhea improved.  Denies vomiting.   - C. Diff negative - Stool culture pending - GI panel neg  Hypokalemia  -  Continue oral potassium once daily and potassium in IVF -  Magnesium level 1.9  PSA (polysubstance abuse, HIV, Hep A, B, C neg. - weaned off methadone about one week ago - plan to discharge on ultram  Transaminitis and hepatomegaly likely due to EtOH abuse, improving.   Leukocytosis due to pancreatitis, resolving  HTN BP stable, continue Clonidine and Lisinopril   Diet:  Full liquid Access:  PIV IVF:  yes Proph:  lovenox  Code Status: full Family Communication: patient alone Disposition Plan: pending tolerating diet, pain controlled on oral pain medications   Consultants:  GI Procedures/Studies:  US Abdomen 08/12/2013 1. No acute findings. 2. Fatty infiltration of the liver.  CXR 08/12/2013 No active disease.  CT abd w/c 51/88 Uncomplicated mild pancreatitis, hepatic steatosis and hepatomegaly. Gynecomastia.  Antibiotics:  Rocephin 12/29 -->  12/30   HPI/Subjective:  Persistent abdominal pain that is better than when he was admitted. Denies vomiting but did have nausea with meals yesterday  Objective: Filed Vitals:   08/16/13 2137 08/17/13 0512 08/17/13 0951 08/17/13 1859  BP: 124/97 149/99 124/80 103/57  Pulse: 78 87  71  Temp: 98.1 F (36.7 C) 97.7 F (36.5 C)  98.2 F (36.8 C)  TempSrc: Oral Oral  Oral  Resp: 20 18  20   Height:      Weight:      SpO2: 98% 96%  96%    Intake/Output Summary (Last 24 hours) at 08/17/13 2000 Last data filed at 08/17/13 1856  Gross per 24 hour  Intake    240 ml  Output    600 ml  Net   -360 ml   Filed Weights   08/13/13 1833  Weight: 108.228 kg (238 lb 9.6 oz)    Exam:   General:  CM, No acute distress  HEENT:  NCAT, MMM  Cardiovascular:  RRR, nl S1, S2 no mrg, 2+ pulses, warm extremities  Respiratory:  CTAB, no increased WOB  Abdomen:   NABS, soft, ND, mild TTP in epigastrium and just to the right of the epigastrium without rebound or guarding, improved from yesterday  MSK:   Normal tone and bulk, no LEE  Neuro:  Grossly intact  Data Reviewed: Basic Metabolic Panel:  Recent Labs Lab 08/12/13 1705 08/13/13 0613 08/14/13 0440 08/15/13 0520 08/15/13 1550 08/16/13 0555  NA 136 138 141 139  --  141  K 3.7 3.2* 3.6* 3.2*  --  3.9  CL 95* 101 104 104  --  103  CO2 21 22 24 23   --  24  GLUCOSE 121* 107* 86 108*  --  91  BUN 8 8 6  3*  --  <3*  CREATININE 0.99 1.03 0.97 0.85  --  0.91  CALCIUM 9.4 8.9 8.6 8.5  --  8.7  MG  --   --   --   --  1.9  --    Liver Function Tests:  Recent Labs Lab 08/12/13 1705 08/14/13 0440 08/15/13 0520  AST 91* 133* 84*  ALT 131* 96* 82*  ALKPHOS 119* 83 74  BILITOT 1.1 0.8 0.5  PROT 8.0 6.3 6.0  ALBUMIN 4.4 3.3* 3.1*    Recent Labs Lab 08/12/13 1705 08/13/13 0615 08/14/13 0440 08/15/13 0520  LIPASE 208* 176* 226* 237*   No results found for this basename: AMMONIA,  in the last 168 hours CBC:  Recent  Labs Lab 08/12/13 1705 08/13/13 0613 08/14/13 0440 08/15/13 0520 08/17/13 0431  WBC 15.4* 8.7 6.9 7.6 8.2  NEUTROABS 13.6*  --   --   --   --   HGB 17.6* 15.5 14.0 13.0 14.8  HCT 48.0 42.9 39.8 37.6* 42.8  MCV 91.8 92.7 94.3 95.7 95.7  PLT 241 187 169 155 194   Cardiac Enzymes: No results found for this basename: CKTOTAL, CKMB, CKMBINDEX, TROPONINI,  in the last 168 hours BNP (last 3 results) No results found for this basename: PROBNP,  in the last 8760 hours CBG: No results found for this basename: GLUCAP,  in the last 168 hours  Recent Results (from the past 240 hour(s))  URINE CULTURE     Status: None   Collection Time    08/13/13  9:36 AM      Result Value Range Status   Specimen Description URINE, CLEAN CATCH   Final   Special Requests NONE   Final   Culture  Setup Time     Final   Value: 08/13/2013 13:43     Performed at [facility]     Final   Value: NO GROWTH     Performed at [facility]   Culture     Final   Value: NO GROWTH     Performed at [facility]   Report Status 08/14/2013 FINAL   Final  CLOSTRIDIUM DIFFICILE BY PCR     Status: None   Collection Time    08/14/13  9:06 AM      Result Value Range Status   C difficile by pcr NEGATIVE  NEGATIVE Final   Comment: Performed at [place]     Status: None   Collection Time    08/14/13  9:06 AM      Result Value Range Status   Specimen Description STOOL   Final   Special Requests NONE   Final   Culture     Final   Value: NO SALMONELLA, SHIGELLA, CAMPYLOBACTER, YERSINIA, OR E.COLI 0157:H7 ISOLATED     Note: REDUCED NORMAL FLORA PRESENT     Performed at [facility]   Report Status 08/17/2013 FINAL   Final     Studies: No results found.  Scheduled Meds: . cloNIDine  0.1 mg Oral BID  . enoxaparin (LOVENOX) injection  50 mg Subcutaneous Q24H  . lisinopril  10 mg Oral Daily  . multivitamin with minerals  1 tablet Oral Daily  .  oxyCODONE  10 mg Oral Q4H  . pantoprazole  40 mg Oral BID AC  . potassium chloride  40 mEq Oral Daily   Continuous Infusions: . dextrose 5 % and 0.45 % NaCl with KCl 40 mEq/L 20 mL (08/17/13 1955)    Principal Problem:   Abdominal pain Active Problems:   Nausea with vomiting   Abdominal  pain, other specified site   Pancreatitis, acute   Hepatic steatosis    Time spent: 30 min    Sherlon Nied, [facility] Pager [phone]. If 7PM-7AM, please contact night-coverage at [email or web address], password [facility] 08/17/2013, 8:00 PM  LOS: 5 days

## 2013-08-18 MED ORDER — ONDANSETRON HCL 4 MG/2ML IJ SOLN
4.0000 mg | Freq: Four times a day (QID) | INTRAMUSCULAR | Status: DC
  Administered 2013-08-18 (×2): 4 mg via INTRAVENOUS
  Filled 2013-08-18 (×3): qty 2

## 2013-08-18 MED ORDER — OXYCODONE HCL 5 MG PO TABS
15.0000 mg | ORAL_TABLET | ORAL | Status: DC
  Administered 2013-08-18 – 2013-08-19 (×7): 15 mg via ORAL
  Filled 2013-08-18 (×7): qty 3

## 2013-08-18 MED ORDER — LOPERAMIDE HCL 2 MG PO CAPS: 2.0000 mg | ORAL_CAPSULE | ORAL | Status: DC | PRN

## 2013-08-18 MED ORDER — SODIUM CHLORIDE 0.9 % IV SOLN
INTRAVENOUS | Status: DC
  Administered 2013-08-18 – 2013-08-19 (×4): via INTRAVENOUS

## 2013-08-18 NOTE — Progress Notes (Signed)
[facility] PROGRESS NOTE  Richard House TMH:[id number] DOB: [birth year] DOA: 08/12/2013 PCP: [name], MD  Brief narrative:  28 y.o. male with history of nausea and vomiting for the past 4 months. He has also been weaning himself off of methadone and finally weaned himself off last week. He has been having N/V daily for the past 4 months and vertigo, but for the past 2 days he has developed new onset epigastric pain. Initially he stated that this was associated with bloody vomit and melena, but his hemoccult in the ED was negative and his Hg was high. He has gotten 1 dose of dilaudid in the ED for pain, and phenergan has provided some relief of the nausea.   Assessment/Plan  Acute alcoholic pancreatitis, has high pain medication tolerance due to previous drug abuse. Patient aware that he will not receive narcotic pain medication at discharge.  Increased pain, vomiting, and inability to eat.  - appreciate GI assistance - continue low fat diet - increase to oxy 15mg  IR - repeat labs in AM  Nausea with vomiting, diarrhea.  Diarrhea improved.  Denies vomiting.   - C. Diff negative - Stool culture neg - GI panel neg  Hypokalemia, resolved.  Repeat K in AM.  PSA (polysubstance abuse, HIV, Hep A, B, C neg. - weaned off methadone about one week ago - plan to discharge on ultram -  May need to restart methadone at discharge  Transaminitis and hepatomegaly likely due to EtOH abuse, improving.   Leukocytosis due to pancreatitis, resolving  HTN BP stable, continue Clonidine and Lisinopril   Diet:  Full liquid Access:  PIV IVF:  yes Proph:  lovenox  Code Status: full Family Communication: patient alone Disposition Plan: pending tolerating diet, pain controlled on oral pain medications   Consultants:  GI Procedures/Studies:  US Abdomen 08/12/2013 1. No acute findings. 2. Fatty infiltration of the liver.  CXR 08/12/2013 No active disease.  CT abd w/c 12/29  Uncomplicated mild pancreatitis, hepatic steatosis and hepatomegaly. Gynecomastia.  Antibiotics:  Rocephin 12/29 --> 12/30   HPI/Subjective:  Worsening abdominal pain with increased nausea and vomiting.  Inability to tolerate meals  Objective: Filed Vitals:   08/17/13 0951 08/17/13 1859 08/17/13 2100 08/18/13 0459  BP: 124/80 103/57 123/79 126/88  Pulse:  71 78 88  Temp:  98.2 F (36.8 C) 98.6 F (37 C) 98.4 F (36.9 C)  TempSrc:  Oral Oral Oral  Resp:  20 20 18   Height:      Weight:      SpO2:  96% 100% 98%    Intake/Output Summary (Last 24 hours) at 08/18/13 1748 Last data filed at 08/17/13 1856  Gross per 24 hour  Intake    240 ml  Output    600 ml  Net   -360 ml   Filed Weights   08/13/13 1833  Weight: 108.228 kg (238 lb 9.6 oz)    Exam:   General:  CM, No acute distress, rocking back and forth with pain  HEENT:  NCAT, MMM  Cardiovascular:  RRR, nl S1, S2 no mrg, 2+ pulses, warm extremities  Respiratory:  CTAB, no increased WOB  Abdomen:   NABS, soft, ND, TTP in epigastrium with some voluntary guarding, no rebound.    MSK:   Normal tone and bulk, no LEE  Neuro:  Grossly intact  Data Reviewed: Basic Metabolic Panel:  Recent Labs Lab 08/12/13 1705 08/13/13 0613 08/14/13 0440 08/15/13 0520 08/15/13 1550 08/16/13 0555  NA 136 138  141 139  --  141  K 3.7 3.2* 3.6* 3.2*  --  3.9  CL 95* 101 104 104  --  103  CO2 21 22 24 23   --  24  GLUCOSE 121* 107* 86 108*  --  91  BUN 8 8 6  3*  --  <3*  CREATININE 0.99 1.03 0.97 0.85  --  0.91  CALCIUM 9.4 8.9 8.6 8.5  --  8.7  MG  --   --   --   --  1.9  --    Liver Function Tests:  Recent Labs Lab 08/12/13 1705 08/14/13 0440 08/15/13 0520  AST 91* 133* 84*  ALT 131* 96* 82*  ALKPHOS 119* 83 74  BILITOT 1.1 0.8 0.5  PROT 8.0 6.3 6.0  ALBUMIN 4.4 3.3* 3.1*    Recent Labs Lab 08/12/13 1705 08/13/13 0615 08/14/13 0440 08/15/13 0520  LIPASE 208* 176* 226* 237*   No results found for this  basename: AMMONIA,  in the last 168 hours CBC:  Recent Labs Lab 08/12/13 1705 08/13/13 0613 08/14/13 0440 08/15/13 0520 08/17/13 0431  WBC 15.4* 8.7 6.9 7.6 8.2  NEUTROABS 13.6*  --   --   --   --   HGB 17.6* 15.5 14.0 13.0 14.8  HCT 48.0 42.9 39.8 37.6* 42.8  MCV 91.8 92.7 94.3 95.7 95.7  PLT 241 187 169 155 194   Cardiac Enzymes: No results found for this basename: CKTOTAL, CKMB, CKMBINDEX, TROPONINI,  in the last 168 hours BNP (last 3 results) No results found for this basename: PROBNP,  in the last 8760 hours CBG: No results found for this basename: GLUCAP,  in the last 168 hours  Recent Results (from the past 240 hour(s))  URINE CULTURE     Status: None   Collection Time    08/13/13  9:36 AM      Result Value Range Status   Specimen Description URINE, CLEAN CATCH   Final   Special Requests NONE   Final   Culture  Setup Time     Final   Value: 08/13/2013 13:43     Performed at [place]     Final   Value: NO GROWTH     Performed at [facility]   Culture     Final   Value: NO GROWTH     Performed at [facility]   Report Status 08/14/2013 FINAL   Final  CLOSTRIDIUM DIFFICILE BY PCR     Status: None   Collection Time    08/14/13  9:06 AM      Result Value Range Status   C difficile by pcr NEGATIVE  NEGATIVE Final   Comment: Performed at [place]     Status: None   Collection Time    08/14/13  9:06 AM      Result Value Range Status   Specimen Description STOOL   Final   Special Requests NONE   Final   Culture     Final   Value: NO SALMONELLA, SHIGELLA, CAMPYLOBACTER, YERSINIA, OR E.COLI 0157:H7 ISOLATED     Note: REDUCED NORMAL FLORA PRESENT     Performed at [facility]   Report Status 08/17/2013 FINAL   Final     Studies: No results found.  Scheduled Meds: . cloNIDine  0.1 mg Oral BID  . enoxaparin (LOVENOX) injection  50 mg Subcutaneous Q24H  . lisinopril  10 mg  Oral  Daily  . multivitamin with minerals  1 tablet Oral Daily  . ondansetron (ZOFRAN) IV  4 mg Intravenous Q6H  . oxyCODONE  15 mg Oral Q4H  . pantoprazole  40 mg Oral BID AC  . potassium chloride  40 mEq Oral Daily   Continuous Infusions: . dextrose 5 % and 0.45 % NaCl with KCl 40 mEq/L 20 mL (08/17/13 1955)    Principal Problem:   Abdominal pain Active Problems:   Nausea with vomiting   Abdominal  pain, other specified site   Pancreatitis, acute   Hepatic steatosis    Time spent: 30 min    Jleigh Striplin, [place] [facility] Pager [phone]. If 7PM-7AM, please contact night-coverage at [email or web address], password [facility] 08/18/2013, 5:48 PM  LOS: 6 days

## 2013-08-19 ENCOUNTER — Encounter (HOSPITAL_COMMUNITY): Payer: Self-pay | Admitting: Internal Medicine

## 2013-08-19 DIAGNOSIS — F1123 Opioid dependence with withdrawal: Secondary | ICD-10-CM

## 2013-08-19 DIAGNOSIS — F1193 Opioid use, unspecified with withdrawal: Secondary | ICD-10-CM

## 2013-08-19 LAB — COMPREHENSIVE METABOLIC PANEL
ALT: 108 U/L — ABNORMAL HIGH (ref 0–53)
AST: 81 U/L — AB (ref 0–37)
Albumin: 3.6 g/dL (ref 3.5–5.2)
Alkaline Phosphatase: 72 U/L (ref 39–117)
BILIRUBIN TOTAL: 0.6 mg/dL (ref 0.3–1.2)
BUN: 9 mg/dL (ref 6–23)
CALCIUM: 8.9 mg/dL (ref 8.4–10.5)
CHLORIDE: 102 meq/L (ref 96–112)
CO2: 26 mEq/L (ref 19–32)
CREATININE: 1.05 mg/dL (ref 0.50–1.35)
GFR calc Af Amer: >90 mL/min (ref >90)
GFR calc non Af Amer: >90 mL/min (ref >90)
Glucose, Bld: 92 mg/dL (ref 70–99)
Potassium: 4.1 mEq/L (ref 3.7–5.3)
Sodium: 140 mEq/L (ref 137–147)
TOTAL PROTEIN: 6.8 g/dL (ref 6.0–8.3)

## 2013-08-19 LAB — CBC
HEMATOCRIT: 41.2 % (ref 39.0–52.0)
HEMOGLOBIN: 14.2 g/dL (ref 13.0–17.0)
MCH: 33 pg (ref 26.0–34.0)
MCHC: 34.5 g/dL (ref 30.0–36.0)
MCV: 95.8 fL (ref 78.0–100.0)
PLATELETS: 194 10*3/uL (ref 150–400)
RBC: 4.3 MIL/uL (ref 4.22–5.81)
RDW: 13.9 % (ref 11.5–15.5)
WBC: 7.4 10*3/uL (ref 4.0–10.5)

## 2013-08-19 LAB — LIPASE, BLOOD: Lipase: 96 U/L — ABNORMAL HIGH (ref 11–59)

## 2013-08-19 MED ORDER — OXYCODONE HCL 5 MG PO TABS
5.0000 mg | ORAL_TABLET | ORAL | Status: DC
  Administered 2013-08-20 (×4): 5 mg via ORAL
  Filled 2013-08-19 (×4): qty 1

## 2013-08-19 MED ORDER — OXYCODONE HCL 5 MG PO TABS
10.0000 mg | ORAL_TABLET | ORAL | Status: AC
  Administered 2013-08-19 – 2013-08-20 (×4): 10 mg via ORAL
  Filled 2013-08-19 (×4): qty 2

## 2013-08-19 MED ORDER — METOCLOPRAMIDE HCL 5 MG/ML IJ SOLN
5.0000 mg | Freq: Four times a day (QID) | INTRAMUSCULAR | Status: DC
  Administered 2013-08-19 – 2013-08-20 (×5): 5 mg via INTRAVENOUS
  Filled 2013-08-19: qty 1
  Filled 2013-08-19 (×2): qty 2
  Filled 2013-08-19: qty 1
  Filled 2013-08-19 (×2): qty 2
  Filled 2013-08-19: qty 1
  Filled 2013-08-19: qty 2
  Filled 2013-08-19: qty 1

## 2013-08-19 NOTE — Progress Notes (Signed)
Pt had a pretty unremarkable day. Ate a good lunch that included a grilled chicken sandwich and baked potato. Slept most of the day. No complaints and no requests for prn pain or nausea med. Vwilliams,rn.

## 2013-08-19 NOTE — Progress Notes (Signed)
[facility] PROGRESS NOTE  Richard House [id number]:[id number] DOB: Jul 19, 1986 DOA: 08/12/2013 PCP: [name], MD  Brief narrative:  28 y.o. male with history of nausea and vomiting for the past 4 months. He has also been weaning himself off of methadone and finally weaned himself off last week. He has been having N/V daily for the past 4 months and vertigo, but for the past 2 days he has developed new onset epigastric pain. Initially he stated that this was associated with bloody vomit and melena, but his hemoccult in the ED was negative and his Hg was high. He has gotten 1 dose of dilaudid in the ED for pain, and phenergan has provided some relief of the nausea.   Assessment/Plan  Acute alcoholic pancreatitis, has high pain medication tolerance due to previous drug abuse. Patient aware that he will not receive narcotic pain medication at discharge. Labs appear improved today.  Tapering narcotics to off.   - appreciate GI assistance - continue low fat diet - decrease to oxy 10mg  IR today, then 5 mg the tomorrow.  Possible discharge tomorrow evening on ultram  Nausea with vomiting, diarrhea due to narcotic withdrawal, persistent, but ate well today - C. Diff negative - Stool culture neg - GI panel neg  Hypokalemia, resolved.  Repeat K in AM.  PSA (polysubstance abuse, HIV, Hep A, B, C neg. - weaned off methadone about one week ago - plan to discharge on ultram  Transaminitis and hepatomegaly likely due to EtOH abuse, improving.   Leukocytosis due to pancreatitis, resolving  HTN BP stable, continue Clonidine and Lisinopril   Diet:  Low fat Access:  PIV IVF:  yes Proph:  lovenox  Code Status: full Family Communication: patient alone Disposition Plan:  Tolerating diet today.     Consultants:  GI Procedures/Studies:  US Abdomen 08/12/2013 1. No acute findings. 2. Fatty infiltration of the liver.  CXR 08/12/2013 No active disease.  CT abd w/c 57/84 Uncomplicated  mild pancreatitis, hepatic steatosis and hepatomegaly. Gynecomastia.  Antibiotics:  Rocephin 12/29 --> 12/30   HPI/Subjective:  Improved abdominal pain this morning and appears to have eaten well today  Objective: Filed Vitals:   08/18/13 0459 08/18/13 2051 08/19/13 0607 08/19/13 1500  BP: 126/88 109/69 121/68 106/68  Pulse: 88 73 62 65  Temp: 98.4 F (36.9 C) 98.4 F (36.9 C) 98.7 F (37.1 C) 98.6 F (37 C)  TempSrc: Oral Oral Oral Oral  Resp: 18 20 18 20   Height:      Weight:      SpO2: 98% 99% 99% 95%    Intake/Output Summary (Last 24 hours) at 08/19/13 1806 Last data filed at 08/19/13 1756  Gross per 24 hour  Intake 8998.16 ml  Output   1802 ml  Net 7196.16 ml   Filed Weights   08/13/13 1833  Weight: 108.228 kg (238 lb 9.6 oz)    Exam:   General:  CM, No acute distress  HEENT:  NCAT, MMM  Cardiovascular:  RRR, nl S1, S2 no mrg, 2+ pulses, warm extremities  Respiratory:  CTAB, no increased WOB  Abdomen:   NABS, soft, ND, much less TTP in epigastrium today without rebound or guarding  MSK:   Normal tone and bulk, no LEE  Neuro:  Grossly intact  Data Reviewed: Basic Metabolic Panel:  Recent Labs Lab 08/13/13 0613 08/14/13 0440 08/15/13 0520 08/15/13 1550 08/16/13 0555 08/19/13 0416  NA 138 141 139  --  141 140  K 3.2* 3.6* 3.2*  --  3.9 4.1  CL 101 104 104  --  103 102  CO2 22 24 23   --  24 26  GLUCOSE 107* 86 108*  --  91 92  BUN 8 6 3*  --  <3* 9  CREATININE 1.03 0.97 0.85  --  0.91 1.05  CALCIUM 8.9 8.6 8.5  --  8.7 8.9  MG  --   --   --  1.9  --   --    Liver Function Tests:  Recent Labs Lab 08/14/13 0440 08/15/13 0520 08/19/13 0416  AST 133* 84* 81*  ALT 96* 82* 108*  ALKPHOS 83 74 72  BILITOT 0.8 0.5 0.6  PROT 6.3 6.0 6.8  ALBUMIN 3.3* 3.1* 3.6    Recent Labs Lab 08/13/13 0615 08/14/13 0440 08/15/13 0520 08/19/13 0416  LIPASE 176* 226* 237* 96*   No results found for this basename: AMMONIA,  in the last 168  hours CBC:  Recent Labs Lab 08/13/13 0613 08/14/13 0440 08/15/13 0520 08/17/13 0431 08/19/13 0416  WBC 8.7 6.9 7.6 8.2 7.4  HGB 15.5 14.0 13.0 14.8 14.2  HCT 42.9 39.8 37.6* 42.8 41.2  MCV 92.7 94.3 95.7 95.7 95.8  PLT 187 169 155 194 194   Cardiac Enzymes: No results found for this basename: CKTOTAL, CKMB, CKMBINDEX, TROPONINI,  in the last 168 hours BNP (last 3 results) No results found for this basename: PROBNP,  in the last 8760 hours CBG: No results found for this basename: GLUCAP,  in the last 168 hours  Recent Results (from the past 240 hour(s))  URINE CULTURE     Status: None   Collection Time    08/13/13  9:36 AM      Result Value Range Status   Specimen Description URINE, CLEAN CATCH   Final   Special Requests NONE   Final   Culture  Setup Time     Final   Value: 08/13/2013 13:43     Performed at [place]     Final   Value: NO GROWTH     Performed at [facility]   Culture     Final   Value: NO GROWTH     Performed at [facility]   Report Status 08/14/2013 FINAL   Final  CLOSTRIDIUM DIFFICILE BY PCR     Status: None   Collection Time    08/14/13  9:06 AM      Result Value Range Status   C difficile by pcr NEGATIVE  NEGATIVE Final   Comment: Performed at [place]     Status: None   Collection Time    08/14/13  9:06 AM      Result Value Range Status   Specimen Description STOOL   Final   Special Requests NONE   Final   Culture     Final   Value: NO SALMONELLA, SHIGELLA, CAMPYLOBACTER, YERSINIA, OR E.COLI 0157:H7 ISOLATED     Note: REDUCED NORMAL FLORA PRESENT     Performed at [facility]   Report Status 08/17/2013 FINAL   Final     Studies: No results found.  Scheduled Meds: . cloNIDine  0.1 mg Oral BID  . enoxaparin (LOVENOX) injection  50 mg Subcutaneous Q24H  . lisinopril  10 mg Oral Daily  . metoCLOPramide (REGLAN) injection  5 mg Intravenous Q6H  .  multivitamin with minerals  1 tablet Oral Daily  . oxyCODONE  10 mg  Oral Q4H  . pantoprazole  40 mg Oral BID AC   Continuous Infusions: . sodium chloride 125 mL/hr at 08/19/13 1155    Principal Problem:   Abdominal pain Active Problems:   Nausea with vomiting   Abdominal  pain, other specified site   Pancreatitis, acute   Hepatic steatosis    Time spent: 30 min    Pranavi Aure, [place] [facility] Pager [phone]. If 7PM-7AM, please contact night-coverage at [email or web address], password [facility] 08/19/2013, 6:06 PM  LOS: 7 days

## 2013-08-20 DIAGNOSIS — F1920 Other psychoactive substance dependence, uncomplicated: Secondary | ICD-10-CM

## 2013-08-20 DIAGNOSIS — E876 Hypokalemia: Secondary | ICD-10-CM

## 2013-08-20 DIAGNOSIS — F19939 Other psychoactive substance use, unspecified with withdrawal, unspecified: Secondary | ICD-10-CM

## 2013-08-20 MED ORDER — PANTOPRAZOLE SODIUM 40 MG PO TBEC: 40.0000 mg | DELAYED_RELEASE_TABLET | Freq: Every day | ORAL | Status: DC

## 2013-08-20 MED ORDER — PROMETHAZINE HCL 25 MG RE SUPP: 25.0000 mg | Freq: Four times a day (QID) | RECTAL | Status: DC | PRN

## 2013-08-20 MED ORDER — TRAMADOL HCL 50 MG PO TABS: 100.0000 mg | ORAL_TABLET | Freq: Four times a day (QID) | ORAL | Status: DC | PRN

## 2013-08-20 MED ORDER — METOCLOPRAMIDE HCL 10 MG PO TABS
10.0000 mg | ORAL_TABLET | Freq: Four times a day (QID) | ORAL | Status: DC | PRN
Start: 2013-08-20 — End: 2013-09-06

## 2013-08-20 NOTE — Progress Notes (Signed)
ANTICOAGULATION CONSULT NOTE - Follow up  Pharmacy Consult for Lovenox Indication: VTE prophylaxis  No Known Allergies  Patient Measurements: Height: 6' (182.9 cm) Weight: 238 lb 9.6 oz (108.228 kg) IBW/kg (Calculated) : 77.6  Vital Signs: Temp: 98.3 F (36.8 C) (01/05 0533) Temp src: Oral (01/05 0533) BP: 121/70 mmHg (01/05 0533) Pulse Rate: 65 (01/05 0533)  Labs:  Recent Labs  08/19/13 0416  HGB 14.2  HCT 41.2  PLT 194  CREATININE 1.05    Estimated Creatinine Clearance: 131.8 ml/min (by C-G formula based on Cr of 1.05).   Medical History: Past Medical History  Diagnosis Date  . Hypertension   . Vertigo   . Ulcer   . Pancreatitis, acute 08/14/2013    Medications:  Scheduled:  . cloNIDine  0.1 mg Oral BID  . enoxaparin (LOVENOX) injection  50 mg Subcutaneous Q24H  . lisinopril  10 mg Oral Daily  . metoCLOPramide (REGLAN) injection  5 mg Intravenous Q6H  . multivitamin with minerals  1 tablet Oral Daily  . oxyCODONE  5 mg Oral Q4H  . pantoprazole  40 mg Oral BID AC   Infusions:  . sodium chloride 75 mL/hr at 08/19/13 2054   PRN: bismuth subsalicylate, gi cocktail, loperamide, promethazine, sodium chloride  Assessment: 28 y/o M with acute pancreatitis, on Lovenox for VTE prophylaxis.  Using dosage of 50mg  SQ q24h (approximately 0.5mg /kg/day).  H/H and pltc stable, WNL.  No bleeding reported.  CrCl >100 ml/min.   Goal of Therapy:  Prophylactic-dose LMWH, adjusted for obesity Monitor platelets by anticoagulation protocol: Yes   Plan:  1.  Continue Lovenox 50 mg SQ q24h 2.  Follow pltc, H/H, monitor for evidence of bleeding.  Pharmacy will sign-off.  Please re-consult if needed.  Thank you!  Ralene Bathe, PharmD, BCPS 08/20/2013, 7:49 AM  Pager: [phone]

## 2013-08-20 NOTE — Discharge Summary (Addendum)
Physician Discharge Summary  [name] POE:[id number] DOB: 1986-07-10 DOA: 08/12/2013  PCP: [name], MD  Admit date: 08/12/2013 Discharge date: 08/20/2013  Recommendations for Outpatient Follow-up:  1. Follow up with primary care doctor in 2 days to follow up on withdrawal symptoms and pancreatitis.  Discharge Diagnoses:  Principal Problem:   Pancreatitis, acute Active Problems:   Nausea with vomiting   Abdominal pain   Abdominal  pain, other specified site   Hepatic steatosis   Acute narcotic withdrawal   Hypokalemia   Discharge Condition: stable, improved  Diet recommendation: low fat  Wt Readings from Last 3 Encounters:  08/13/13 108.228 kg (238 lb 9.6 oz)  07/18/13 107.956 kg (238 lb)  05/02/13 105.235 kg (232 lb)    History of present illness:  Richard House is a 28 y.o. male with history of nausea and vomiting for the past 4 months. He has also been weaning himself off of methadone and finally weaned himself off last week. He has been having N/V daily for the past 4 months due to vertigo, but for the past 2 days he has developed new onset epigastric pain. Initially he stated that this was associated with bloody vomit and melena, but his hemoccult in the ED was negative and his HGB was high. He has gotten 1 dose of dilaudid in the ED for pain, and phenergan has provided some relief of the nausea and vomiting at home.  Hospital Course:   Acute alcoholic pancreatitis:  Lipase hovered in the 200s range initially and trended down to 96.  He was started on IV dilaudid and required 2mg  IV q4h for pain control.  He was slowly tapered down on his narcotics to oxycodone 5mg  q4h and he will be discharged with two days worth of ultram.  He was able to tolerate a low fat diet prior to discharge and his pain has mostly resolved.  He is advised to abstain from alcohol and drug use.  Information about substance abuse was provided to him.    Nausea with vomiting, diarrhea  due to narcotic withdrawal, has been slowly improving.  C. Diff, Stool culture, and GI panel were negative.  He was treated with antiemetics and imodium which he can continue at home.    Hypokalemia, resolved with oral repletion.     PSA (polysubstance abuse, HIV, Hep A, B, C neg.  Will discharge on ultram x 2 days.   Transaminitis and hepatomegaly likely due to EtOH abuse, improved. Leukocytosis due to pancreatitis, resolved. HTN BP stable, continued Clonidine and Lisinopril   Consultants:  GI Procedures/Studies:  US Abdomen 08/12/2013 1. No acute findings. 2. Fatty infiltration of the liver.  CXR 08/12/2013 No active disease.  CT abd w/c 12/29 Uncomplicated mild pancreatitis, hepatic steatosis and hepatomegaly. Gynecomastia.  Antibiotics:  Rocephin 12/29 --> 12/30  Discharge Exam: Filed Vitals:   08/20/13 1346  BP: 108/73  Pulse: 62  Temp: 98.4 F (36.9 C)  Resp: 18   Filed Vitals:   08/19/13 1500 08/19/13 2130 08/20/13 0533 08/20/13 1346  BP: 106/68 117/87 121/70 108/73  Pulse: 65 69 65 62  Temp: 98.6 F (37 C) 97.9 F (36.6 C) 98.3 F (36.8 C) 98.4 F (36.9 C)  TempSrc: Oral Oral Oral Oral  Resp: 20 18 18 18   Height:      Weight:      SpO2: 95% 97% 100% 100%    General: CM, No acute distress  HEENT: NCAT, MMM  Cardiovascular: RRR, nl S1, S2 no mrg,  2+ pulses, warm extremities  Respiratory: CTAB, no increased WOB  Abdomen: NABS, soft, ND, minimal TTP in epigastrium, no rebound or guarding  MSK: Normal tone and bulk, no LEE  Neuro: Grossly intact   Discharge Instructions      Discharge Orders   Future Appointments Provider Department Dept Phone   09/06/2013 10:00 AM [name], MD [place] [phone]   Future Orders Complete By Expires   Call MD for:  difficulty breathing, headache or visual disturbances  As directed    Call MD for:  extreme fatigue  As directed    Call MD for:  hives  As directed    Call MD  for:  persistant dizziness or light-headedness  As directed    Call MD for:  persistant nausea and vomiting  As directed    Call MD for:  severe uncontrolled pain  As directed    Call MD for:  temperature >100.4  As directed    Diet - low sodium heart healthy  As directed    Discharge instructions  As directed    Comments:     You were hospitalized with acute pancreatitis and gastritis due to alcohol use and narcotic withdrawal.  Use reglan and phenergan as needed for nausea.  You may use ultram the next two days for withdrawal symptoms.  Please do not take more than prescribed.  You may use tylenol as needed for pain.  For your gastritis, please take protonix daily and you may use lomotil or imodium for diarrhea.  Drink at least 1.5L of fluid daily to stay hydrated.  See your doctor in 2 days for check up.   Increase activity slowly  As directed        Medication List    STOP taking these medications       methadone 10 MG/ML solution  Commonly known as:  DOLOPHINE     ondansetron 4 MG disintegrating tablet  Commonly known as:  ZOFRAN ODT     OVER THE COUNTER MEDICATION      TAKE these medications       bismuth subsalicylate 99991111 [id number] suspension  Commonly known as:  PEPTO BISMOL  Take 30 mLs by mouth every 6 (six) hours as needed for indigestion.     cloNIDine 0.1 MG tablet  Commonly known as:  CATAPRES  Take 1 tablet (0.1 mg total) by mouth 2 (two) times daily.     lisinopril 10 MG tablet  Commonly known as:  PRINIVIL,ZESTRIL  Take 1 tablet (10 mg total) by mouth daily.     metoCLOPramide 10 MG tablet  Commonly known as:  REGLAN  Take 1 tablet (10 mg total) by mouth every 6 (six) hours as needed for nausea or vomiting.     multivitamin with minerals Tabs tablet  Take 1 tablet by mouth daily.     pantoprazole 40 MG tablet  Commonly known as:  PROTONIX  Take 1 tablet (40 mg total) by mouth daily.     promethazine 25 MG suppository  Commonly known as:  PHENERGAN   Place 1 suppository (25 mg total) rectally every 6 (six) hours as needed for nausea or vomiting.     traMADol 50 MG tablet  Commonly known as:  ULTRAM  Take 2 tablets (100 mg total) by mouth every 6 (six) hours as needed.       Follow-up Information   Follow up with [name], MD In 2 days.   Specialty:  Internal Medicine   Contact information:   [place]  [id number] [phone]        The results of significant diagnostics from this hospitalization (including imaging, microbiology, ancillary and laboratory) are listed below for reference.    Significant Diagnostic Studies: Mr Brain Wo Contrast  07/31/2013   CLINICAL DATA:  Gait abnormality.  Nausea vomiting.  Vertigo.  EXAM: MRI HEAD WITHOUT CONTRAST  TECHNIQUE: Multiplanar, multiecho pulse sequences of the brain and surrounding structures were obtained without intravenous contrast.  COMPARISON:  CT head 04/03/2013  FINDINGS: Ventricle size is normal. Craniocervical junction is normal. Negative for Chiari malformation. Pituitary is normal in size.  Negative for acute or chronic infarction. Negative for mass or edema. Negative for hemorrhage or fluid collection. No shift of the midline structures.  Paranasal sinuses are clear. Vessels at the base of the brain are patent.  IMPRESSION: Normal   Electronically Signed   By: Franchot Gallo M.D.   On: 07/31/2013 10:48   US Abdomen Complete  08/12/2013   CLINICAL DATA:  Abdominal pain.  Pancreatitis  EXAM: ULTRASOUND ABDOMEN COMPLETE  COMPARISON:  None.  FINDINGS: Gallbladder:  No gallstones or wall thickening visualized. No sonographic Murphy sign noted.  Common bile duct:  Diameter: 3.1 mm  Liver:  Diffuse increased echogenicity.  No focal lesion noted.  IVC:  No abnormality visualized.  Pancreas:  Visualized portion unremarkable.  Spleen:  Size and appearance within normal limits.  Right Kidney:  Length: 10.9 cm. Echogenicity within normal limits. No mass or  hydronephrosis visualized.  Left Kidney:  Length: 10.1 cm. Echogenicity within normal limits. No mass or hydronephrosis visualized.  Abdominal aorta:  No aneurysm visualized.  Other findings:  None.  IMPRESSION: 1. No acute findings. 2. Fatty infiltration of the liver.   Electronically Signed   By: Kerby Moors M.D.   On: 08/12/2013 20:43   Ct Abdomen Pelvis W Contrast  08/13/2013   CLINICAL DATA:  Elevated white blood cell count and lipase. Pancreatitis. History of ulcer. Nausea and vomiting for 4 months.  EXAM: CT ABDOMEN AND PELVIS WITH CONTRAST  TECHNIQUE: Multidetector CT imaging of the abdomen and pelvis was performed using the standard protocol following bolus administration of intravenous contrast.  CONTRAST:  159mL OMNIPAQUE IOHEXOL 300 MG/ML  SOLN  COMPARISON:  Ultrasound 08/12/2013 abdomen.  FINDINGS: Lower Chest: Patchy subsegmental atelectasis at the bases. Gynecomastia, incompletely imaged. Normal heart size without pericardial or pleural effusion.  Abdomen/Pelvis: Moderate to marked hepatic steatosis. This is somewhat heterogeneous, and more pronounced in the anterior segment right liver lobe. Hepatomegaly, greater than 20 cm craniocaudal. Normal spleen, stomach.  Mild edema is identified about the pancreatic head and uncinate process as well as throughout the remainder of the anterior pararenal space. Example image 38/series 2 and extending more inferiorly on image 51/series 2. Pancreas enhances normally, without evidence of peripancreatic fluid collection, pancreatic ductal dilatation.  No calcified gallstones or evidence of acute cholecystitis. No biliary ductal dilatation or evidence of choledocholithiasis.  All vascular structures enhance normally.  Normal adrenal glands and kidneys. No retroperitoneal or retrocrural adenopathy. The distal colon is underdistended. Apparent wall thickening is felt to be secondary. Example within the sigmoid on image 80/series 2. Normal terminal ileum and  appendix. Appendix not visualized. Normal small bowel, without abdominal intraperitoneal fluid or free intraperitoneal air.  Small fat containing left inguinal hernia. No pelvic adenopathy. Normal urinary bladder and prostate. Trace cul-de-sac fluid which is likely secondary.  Bones/Musculoskeletal:  No  acute osseous abnormality.  IMPRESSION: 1. Findings of uncomplicated mild pancreatitis, centered within the head and uncinate process. 2. Hepatic steatosis and hepatomegaly. 3. Apparent distal colonic thickening is favored to be due to underdistention. Difficult to exclude concurrent infectious colitis. 4. Small volume cul-de-sac fluid which is likely secondary to the the pancreatic process. 5. Tiny fat containing left inguinal hernia. 6. Gynecomastia.   Electronically Signed   By: Abigail Miyamoto M.D.   On: 08/13/2013 19:43   Dg Chest Port 1 View  08/12/2013   CLINICAL DATA:  GI bleed.  Rule out free air  EXAM: PORTABLE CHEST - 1 VIEW  COMPARISON:  None.  FINDINGS: The heart size is normal and the vascularity is normal. Lungs are clear. No free air under the diaphragm.  IMPRESSION: No active disease.   Electronically Signed   By: Franchot Gallo M.D.   On: 08/12/2013 16:40    Microbiology: Recent Results (from the past 240 hour(s))  URINE CULTURE     Status: None   Collection Time    08/13/13  9:36 AM      Result Value Range Status   Specimen Description URINE, CLEAN CATCH   Final   Special Requests NONE   Final   Culture  Setup Time     Final   Value: 08/13/2013 13:43     Performed at [place]     Final   Value: NO GROWTH     Performed at [facility]   Culture     Final   Value: NO GROWTH     Performed at [facility]   Report Status 08/14/2013 FINAL   Final  CLOSTRIDIUM DIFFICILE BY PCR     Status: None   Collection Time    08/14/13  9:06 AM      Result Value Range Status   C difficile by pcr NEGATIVE  NEGATIVE Final   Comment: Performed at  [place]     Status: None   Collection Time    08/14/13  9:06 AM      Result Value Range Status   Specimen Description STOOL   Final   Special Requests NONE   Final   Culture     Final   Value: NO SALMONELLA, SHIGELLA, CAMPYLOBACTER, YERSINIA, OR E.COLI 0157:H7 ISOLATED     Note: REDUCED NORMAL FLORA PRESENT     Performed at [facility]   Report Status 08/17/2013 FINAL   Final     Labs: Basic Metabolic Panel:  Recent Labs Lab 08/14/13 0440 08/15/13 0520 08/15/13 1550 08/16/13 0555 08/19/13 0416  NA 141 139  --  141 140  K 3.6* 3.2*  --  3.9 4.1  CL 104 104  --  103 102  CO2 24 23  --  24 26  GLUCOSE 86 108*  --  91 92  BUN 6 3*  --  <3* 9  CREATININE 0.97 0.85  --  0.91 1.05  CALCIUM 8.6 8.5  --  8.7 8.9  MG  --   --  1.9  --   --    Liver Function Tests:  Recent Labs Lab 08/14/13 0440 08/15/13 0520 08/19/13 0416  AST 133* 84* 81*  ALT 96* 82* 108*  ALKPHOS 83 74 72  BILITOT 0.8 0.5 0.6  PROT 6.3 6.0 6.8  ALBUMIN 3.3* 3.1* 3.6    Recent Labs Lab 08/14/13 0440 08/15/13 0520 08/19/13 0416  LIPASE 226* 237* 96*   No results found for this basename: AMMONIA,  in the last 168 hours CBC:  Recent Labs Lab 08/14/13 0440 08/15/13 0520 08/17/13 0431 08/19/13 0416  WBC 6.9 7.6 8.2 7.4  HGB 14.0 13.0 14.8 14.2  HCT 39.8 37.6* 42.8 41.2  MCV 94.3 95.7 95.7 95.8  PLT 169 155 194 194   Cardiac Enzymes: No results found for this basename: CKTOTAL, CKMB, CKMBINDEX, TROPONINI,  in the last 168 hours BNP: BNP (last 3 results) No results found for this basename: PROBNP,  in the last 8760 hours CBG: No results found for this basename: GLUCAP,  in the last 168 hours  Time coordinating discharge: 45 minutes  Signed:  Gwynn Crossley  [facility] 08/20/2013, 6:20 PM

## 2013-09-06 ENCOUNTER — Telehealth: Payer: Self-pay | Admitting: Emergency Medicine

## 2013-09-06 ENCOUNTER — Other Ambulatory Visit: Payer: Self-pay | Admitting: Emergency Medicine

## 2013-09-06 ENCOUNTER — Ambulatory Visit: Payer: No Typology Code available for payment source | Admitting: Internal Medicine

## 2013-09-06 DIAGNOSIS — R1110 Vomiting, unspecified: Secondary | ICD-10-CM

## 2013-09-06 MED ORDER — PANTOPRAZOLE SODIUM 40 MG PO TBEC: 40.0000 mg | DELAYED_RELEASE_TABLET | Freq: Every day | ORAL | Status: DC

## 2013-09-06 MED ORDER — METOCLOPRAMIDE HCL 10 MG PO TABS: 10.0000 mg | ORAL_TABLET | Freq: Four times a day (QID) | ORAL | Status: DC | PRN

## 2013-09-06 NOTE — Telephone Encounter (Signed)
Pt requesting order Tramadol for pain and referral for  Pancreatitis

## 2013-09-07 ENCOUNTER — Other Ambulatory Visit: Payer: Self-pay | Admitting: Emergency Medicine

## 2013-09-10 ENCOUNTER — Encounter: Payer: Self-pay | Admitting: Internal Medicine

## 2013-09-24 ENCOUNTER — Inpatient Hospital Stay (HOSPITAL_COMMUNITY)
Admission: EM | Admit: 2013-09-24 | Discharge: 2013-10-02 | DRG: 439 | Disposition: A | Discharge status: Home / Self Care | Payer: No Typology Code available for payment source | Attending: Internal Medicine | Admitting: Internal Medicine

## 2013-09-24 ENCOUNTER — Emergency Department (HOSPITAL_COMMUNITY): Payer: No Typology Code available for payment source

## 2013-09-24 ENCOUNTER — Encounter (HOSPITAL_COMMUNITY): Payer: Self-pay | Admitting: Emergency Medicine

## 2013-09-24 DIAGNOSIS — R509 Fever, unspecified: Secondary | ICD-10-CM

## 2013-09-24 DIAGNOSIS — R824 Acetonuria: Secondary | ICD-10-CM | POA: Diagnosis present

## 2013-09-24 DIAGNOSIS — F1120 Opioid dependence, uncomplicated: Secondary | ICD-10-CM | POA: Diagnosis present

## 2013-09-24 DIAGNOSIS — R160 Hepatomegaly, not elsewhere classified: Secondary | ICD-10-CM | POA: Diagnosis present

## 2013-09-24 DIAGNOSIS — K219 Gastro-esophageal reflux disease without esophagitis: Secondary | ICD-10-CM | POA: Diagnosis present

## 2013-09-24 DIAGNOSIS — Z791 Long term (current) use of non-steroidal anti-inflammatories (NSAID): Secondary | ICD-10-CM

## 2013-09-24 DIAGNOSIS — N390 Urinary tract infection, site not specified: Secondary | ICD-10-CM | POA: Insufficient documentation

## 2013-09-24 DIAGNOSIS — F1010 Alcohol abuse, uncomplicated: Secondary | ICD-10-CM | POA: Diagnosis present

## 2013-09-24 DIAGNOSIS — F17200 Nicotine dependence, unspecified, uncomplicated: Secondary | ICD-10-CM | POA: Diagnosis present

## 2013-09-24 DIAGNOSIS — H8110 Benign paroxysmal vertigo, unspecified ear: Secondary | ICD-10-CM | POA: Diagnosis present

## 2013-09-24 DIAGNOSIS — R7989 Other specified abnormal findings of blood chemistry: Secondary | ICD-10-CM | POA: Diagnosis present

## 2013-09-24 DIAGNOSIS — F19939 Other psychoactive substance use, unspecified with withdrawal, unspecified: Secondary | ICD-10-CM | POA: Diagnosis present

## 2013-09-24 DIAGNOSIS — R809 Proteinuria, unspecified: Secondary | ICD-10-CM | POA: Diagnosis present

## 2013-09-24 DIAGNOSIS — R945 Abnormal results of liver function studies: Secondary | ICD-10-CM

## 2013-09-24 DIAGNOSIS — R109 Unspecified abdominal pain: Secondary | ICD-10-CM

## 2013-09-24 DIAGNOSIS — R7401 Elevation of levels of liver transaminase levels: Secondary | ICD-10-CM | POA: Diagnosis present

## 2013-09-24 DIAGNOSIS — E860 Dehydration: Secondary | ICD-10-CM | POA: Diagnosis present

## 2013-09-24 DIAGNOSIS — E876 Hypokalemia: Secondary | ICD-10-CM | POA: Diagnosis present

## 2013-09-24 DIAGNOSIS — K859 Acute pancreatitis without necrosis or infection, unspecified: Principal | ICD-10-CM | POA: Diagnosis present

## 2013-09-24 DIAGNOSIS — Z23 Encounter for immunization: Secondary | ICD-10-CM

## 2013-09-24 DIAGNOSIS — R112 Nausea with vomiting, unspecified: Secondary | ICD-10-CM | POA: Diagnosis present

## 2013-09-24 DIAGNOSIS — R7402 Elevation of levels of lactic acid dehydrogenase (LDH): Secondary | ICD-10-CM | POA: Diagnosis present

## 2013-09-24 DIAGNOSIS — I10 Essential (primary) hypertension: Secondary | ICD-10-CM | POA: Diagnosis present

## 2013-09-24 DIAGNOSIS — R197 Diarrhea, unspecified: Secondary | ICD-10-CM | POA: Diagnosis present

## 2013-09-24 DIAGNOSIS — R740 Nonspecific elevation of levels of transaminase and lactic acid dehydrogenase [LDH]: Secondary | ICD-10-CM

## 2013-09-24 DIAGNOSIS — K7689 Other specified diseases of liver: Secondary | ICD-10-CM | POA: Diagnosis present

## 2013-09-24 DIAGNOSIS — K760 Fatty (change of) liver, not elsewhere classified: Secondary | ICD-10-CM | POA: Diagnosis present

## 2013-09-24 DIAGNOSIS — Z79899 Other long term (current) drug therapy: Secondary | ICD-10-CM

## 2013-09-24 DIAGNOSIS — Z8249 Family history of ischemic heart disease and other diseases of the circulatory system: Secondary | ICD-10-CM

## 2013-09-24 DIAGNOSIS — E669 Obesity, unspecified: Secondary | ICD-10-CM | POA: Diagnosis present

## 2013-09-24 LAB — RAPID URINE DRUG SCREEN, HOSP PERFORMED
Amphetamines: NOT DETECTED
Barbiturates: NOT DETECTED
Benzodiazepines: NOT DETECTED
Cocaine: NOT DETECTED
Opiates: POSITIVE — AB
Tetrahydrocannabinol: NOT DETECTED

## 2013-09-24 LAB — COMPREHENSIVE METABOLIC PANEL
ALT: 211 U/L — ABNORMAL HIGH (ref 0–53)
AST: 223 U/L — ABNORMAL HIGH (ref 0–37)
Albumin: 4.1 g/dL (ref 3.5–5.2)
Alkaline Phosphatase: 118 U/L — ABNORMAL HIGH (ref 39–117)
BILIRUBIN TOTAL: 1.9 mg/dL — AB (ref 0.3–1.2)
BUN: 5 mg/dL — AB (ref 6–23)
CHLORIDE: 97 meq/L (ref 96–112)
CO2: 19 mEq/L (ref 19–32)
CREATININE: 1.05 mg/dL (ref 0.50–1.35)
Calcium: 9.2 mg/dL (ref 8.4–10.5)
GFR calc Af Amer: >90 mL/min (ref >90)
GFR calc non Af Amer: >90 mL/min (ref >90)
Glucose, Bld: 146 mg/dL — ABNORMAL HIGH (ref 70–99)
Potassium: 3.5 mEq/L — ABNORMAL LOW (ref 3.7–5.3)
Sodium: 139 mEq/L (ref 137–147)
TOTAL PROTEIN: 8 g/dL (ref 6.0–8.3)

## 2013-09-24 LAB — LIPID PANEL
Cholesterol: 151 mg/dL (ref 0–200)
HDL: 61 mg/dL (ref >39)
LDL Cholesterol: 62 mg/dL (ref 0–99)
TRIGLYCERIDES: 139 mg/dL (ref <150)
Total CHOL/HDL Ratio: 2.5 RATIO
VLDL: 28 mg/dL (ref 0–40)

## 2013-09-24 LAB — CBC WITH DIFFERENTIAL/PLATELET
BASOS PCT: 0 % (ref 0–1)
Basophils Absolute: 0 10*3/uL (ref 0.0–0.1)
EOS ABS: 0.1 10*3/uL (ref 0.0–0.7)
Eosinophils Relative: 1 % (ref 0–5)
HEMATOCRIT: 48.4 % (ref 39.0–52.0)
Hemoglobin: 17.8 g/dL — ABNORMAL HIGH (ref 13.0–17.0)
LYMPHS ABS: 1.5 10*3/uL (ref 0.7–4.0)
Lymphocytes Relative: 11 % — ABNORMAL LOW (ref 12–46)
MCH: 35 pg — AB (ref 26.0–34.0)
MCHC: 36.8 g/dL — AB (ref 30.0–36.0)
MCV: 95.1 fL (ref 78.0–100.0)
MONO ABS: 0.5 10*3/uL (ref 0.1–1.0)
MONOS PCT: 4 % (ref 3–12)
NEUTROS PCT: 84 % — AB (ref 43–77)
Neutro Abs: 11 10*3/uL — ABNORMAL HIGH (ref 1.7–7.7)
Platelets: 309 10*3/uL (ref 150–400)
RBC: 5.09 MIL/uL (ref 4.22–5.81)
RDW: 14.7 % (ref 11.5–15.5)
WBC: 13.1 10*3/uL — ABNORMAL HIGH (ref 4.0–10.5)

## 2013-09-24 LAB — URINE MICROSCOPIC-ADD ON

## 2013-09-24 LAB — URINALYSIS, ROUTINE W REFLEX MICROSCOPIC
Glucose, UA: NEGATIVE mg/dL
Hgb urine dipstick: NEGATIVE
KETONES UR: 15 mg/dL — AB
Nitrite: POSITIVE — AB
PROTEIN: 100 mg/dL — AB
Specific Gravity, Urine: 1.025 (ref 1.005–1.030)
Urobilinogen, UA: 1 mg/dL (ref 0.0–1.0)
pH: 6 (ref 5.0–8.0)

## 2013-09-24 LAB — SALICYLATE LEVEL: Salicylate Lvl: <2 mg/dL — ABNORMAL LOW (ref 2.8–20.0)

## 2013-09-24 LAB — LIPASE, BLOOD: LIPASE: 41 U/L (ref 11–59)

## 2013-09-24 LAB — LACTIC ACID, PLASMA: LACTIC ACID, VENOUS: 1.3 mmol/L (ref 0.5–2.2)

## 2013-09-24 MED ORDER — HYDROMORPHONE HCL PF 1 MG/ML IJ SOLN
1.0000 mg | INTRAMUSCULAR | Status: AC | PRN
  Administered 2013-09-24 – 2013-09-25 (×3): 1 mg via INTRAVENOUS
  Filled 2013-09-24 (×3): qty 1

## 2013-09-24 MED ORDER — METOCLOPRAMIDE HCL 5 MG/ML IJ SOLN
10.0000 mg | Freq: Once | INTRAMUSCULAR | Status: AC
  Administered 2013-09-24: 10 mg via INTRAVENOUS
  Filled 2013-09-24: qty 2

## 2013-09-24 MED ORDER — PANTOPRAZOLE SODIUM 40 MG PO TBEC
40.0000 mg | DELAYED_RELEASE_TABLET | Freq: Every day | ORAL | Status: DC
  Administered 2013-09-24 – 2013-09-27 (×4): 40 mg via ORAL
  Filled 2013-09-24 (×5): qty 1

## 2013-09-24 MED ORDER — HEPARIN SODIUM (PORCINE) 5000 UNIT/ML IJ SOLN
5000.0000 [IU] | Freq: Three times a day (TID) | INTRAMUSCULAR | Status: DC
  Administered 2013-09-24 – 2013-09-25 (×2): 5000 [IU] via SUBCUTANEOUS
  Filled 2013-09-24 (×21): qty 1

## 2013-09-24 MED ORDER — PNEUMOCOCCAL VAC POLYVALENT 25 MCG/0.5ML IJ INJ
0.5000 mL | INJECTION | INTRAMUSCULAR | Status: AC
  Administered 2013-09-25: 0.5 mL via INTRAMUSCULAR
  Filled 2013-09-24 (×2): qty 0.5

## 2013-09-24 MED ORDER — SODIUM CHLORIDE 0.9 % IV BOLUS (SEPSIS)
1000.0000 mL | Freq: Once | INTRAVENOUS | Status: AC
  Administered 2013-09-24: 1000 mL via INTRAVENOUS

## 2013-09-24 MED ORDER — ONDANSETRON HCL 4 MG/2ML IJ SOLN
4.0000 mg | Freq: Once | INTRAMUSCULAR | Status: AC
  Administered 2013-09-24: 4 mg via INTRAVENOUS
  Filled 2013-09-24: qty 2

## 2013-09-24 MED ORDER — IOHEXOL 300 MG/ML  SOLN
100.0000 mL | Freq: Once | INTRAMUSCULAR | Status: AC | PRN
  Administered 2013-09-24: 100 mL via INTRAVENOUS

## 2013-09-24 MED ORDER — POTASSIUM CHLORIDE CRYS ER 20 MEQ PO TBCR
40.0000 meq | EXTENDED_RELEASE_TABLET | Freq: Once | ORAL | Status: AC
  Administered 2013-09-24: 40 meq via ORAL
  Filled 2013-09-24: qty 2

## 2013-09-24 MED ORDER — SODIUM CHLORIDE 0.9 % IV SOLN
INTRAVENOUS | Status: AC
Start: 2013-09-24 — End: 2013-09-25
  Administered 2013-09-24: 17:00:00 via INTRAVENOUS

## 2013-09-24 MED ORDER — METOCLOPRAMIDE HCL 10 MG PO TABS
10.0000 mg | ORAL_TABLET | Freq: Four times a day (QID) | ORAL | Status: DC | PRN
  Administered 2013-09-24 – 2013-09-25 (×3): 10 mg via ORAL
  Filled 2013-09-24 (×3): qty 1

## 2013-09-24 MED ORDER — HYDROMORPHONE HCL PF 1 MG/ML IJ SOLN
1.0000 mg | Freq: Once | INTRAMUSCULAR | Status: AC
  Administered 2013-09-24: 1 mg via INTRAVENOUS
  Filled 2013-09-24: qty 1

## 2013-09-24 MED ORDER — CEFTRIAXONE SODIUM 1 G IJ SOLR
1.0000 g | Freq: Once | INTRAMUSCULAR | Status: AC
  Administered 2013-09-24: 1 g via INTRAVENOUS
  Filled 2013-09-24: qty 10

## 2013-09-24 MED ORDER — ONDANSETRON HCL 4 MG/2ML IJ SOLN
4.0000 mg | Freq: Four times a day (QID) | INTRAMUSCULAR | Status: DC
  Filled 2013-09-24 (×3): qty 2

## 2013-09-24 MED ORDER — IOHEXOL 300 MG/ML  SOLN
50.0000 mL | Freq: Once | INTRAMUSCULAR | Status: AC | PRN
  Administered 2013-09-24: 50 mL via ORAL

## 2013-09-24 MED ORDER — SODIUM CHLORIDE 0.9 % IV SOLN
INTRAVENOUS | Status: AC
  Administered 2013-09-24: 23:00:00 via INTRAVENOUS

## 2013-09-24 MED ORDER — METOCLOPRAMIDE HCL 5 MG/ML IJ SOLN: 10.0000 mg | Freq: Four times a day (QID) | INTRAMUSCULAR | Status: DC

## 2013-09-24 MED ORDER — SODIUM CHLORIDE 0.9 % IJ SOLN
3.0000 mL | Freq: Two times a day (BID) | INTRAMUSCULAR | Status: DC
  Administered 2013-09-25 – 2013-09-30 (×9): 3 mL via INTRAVENOUS

## 2013-09-24 NOTE — Progress Notes (Deleted)
[facility] History and Physical  Richard House NIO:[id number] DOB: 1986-02-21 DOA: 09/24/2013  Referring physician: ED PCP: [name], MD  Specialists: GI  Chief Complaint: Nausea vomiting  HPI: Richard House is a 28 y.o. male, known prior history her abuse, current smoker, mildly obese, recently admitted 12/28-1/5 with acute pancreatitis in a setting of chronic nausea but today started admission developed epigastric pain consistent with the same came it was thought on that admission that his nausea vomiting was from opiate withdrawal.-He and extensive serological workup with negative HIV, hepatitis panel and was discharged home He presented to to [facility] ed 09/24/2013 with almost 25 days history of nausea vomiting. He states that 3 days subsequent to being discharged he had further episodes of nausea vomiting difficulty holding food down. He describes epigastric pain which is knifelike in nature and boring through to the back without any CVA tenderness or other symptoms Has not been able to tolerate liquids for 3 days Has not been able to tolerate solid food for 4-5 days  He hasn't had any alcohol and maybe a month and weaned off of methadone on 12/24 completely-please note that he was admitted 12/28 so this is probably not secondary to withdrawal from opiates. He denies any dysuria any contact with ill children and no strange food or rashes. He has not noticed dark stool or light stool tarry stool  He has been worked up for his chronic vertigo by neurologist who did not denote any signs of nystagmus or cerebellar dysfunction and an MRI brain was performed on 12/16 which was completely normal He has gained about 30 pounds in the past 8 months which coincides with the time that he started to taper off and stopped using heroin  he had an episode of unilateral right gynecomastia which has been tapped and is negative for malignancy   Emergency room workup = sodium 139 potassium 3.5, local  son 6, alkaline phosphatase 118, AST 223, ALT 211, total bili 1.9 Anion gap 19  potassium 3.5 WC 13.1 hemoglobin 17.8 Urinalysis-orange color, moderate bilirubin, moderate ketonuria 15 small nitrates positive leukocyte mild proteinuria   Review of Systems: The patient denies chest pain blurred vision double vision weakness and left-sided body contacts Rash Other issues above  Past Medical History  Diagnosis Date  . Hypertension   . Vertigo   . Ulcer   . Pancreatitis, acute 08/14/2013   History reviewed. No pertinent past surgical history. Social History:  History   Social History Narrative   Care from [place] in June 2014   Unemployed currently and is staying with his uncle here in [place] here to get cleaned out from his drug issues          No Known Allergies  Family History  Problem Relation Age of Onset  . High blood pressure Mother   . High blood pressure Father   . Migraines Paternal Grandmother     Prior to Admission medications   Medication Sig Start Date End Date Taking? Authorizing Provider  bismuth subsalicylate (PEPTO BISMOL) 262 MG/15ML suspension Take 30 mLs by mouth every 6 (six) hours as needed for indigestion.   Yes Historical Provider, MD  loperamide (IMODIUM) 1 MG/5ML solution Take 2 mg by mouth daily as needed for diarrhea or loose stools.   Yes Historical Provider, MD  metoCLOPramide (REGLAN) 10 MG tablet Take 1 tablet (10 mg total) by mouth every 6 (six) hours as needed for nausea or vomiting. 09/06/13  Yes [name], MD  pantoprazole (  PROTONIX) 40 MG tablet Take 1 tablet (40 mg total) by mouth daily. 09/06/13  Yes [name], MD  promethazine (PHENERGAN) 25 MG suppository Place 1 suppository (25 mg total) rectally every 6 (six) hours as needed for nausea or vomiting. 08/20/13  Yes Janece Canterbury, MD  traMADol (ULTRAM) 50 MG tablet Take 100 mg by mouth every 6 (six) hours as needed for moderate pain.   Yes Historical Provider, MD    Physical Exam: Filed Vitals:   09/24/13 1009 09/24/13 1225 09/24/13 1227  BP: 127/85  157/102  Pulse: 128 91 95  Temp: 98.2 F (36.8 C)  98.5 F (36.9 C)  TempSrc: Oral  Oral  Resp: 20  18  SpO2: 97% 93%      General:  EOMI, NCAT, not dress  Eyes: See above  ENT: No thyromegaly  Neck: Soft supple  Cardiovascular: S1-S2 no murmur rub or gallop  Respiratory: Clinically clear  Abdomen: Soft, epigastric tenderness noted no rebound, lower abdominal striae  Skin: no le edema   Musculoskeletal: ROM intact  Psychiatric: euthymic  Neurologic: Grossly intact no asterixis, 5/5 power major muscle groups, ambulatory  Labs on Admission:  Basic Metabolic Panel:  Recent Labs Lab 09/24/13 1124  NA 139  K 3.5*  CL 97  CO2 19  GLUCOSE 146*  BUN 5*  CREATININE 1.05  CALCIUM 9.2   Liver Function Tests:  Recent Labs Lab 09/24/13 1124  AST 223*  ALT 211*  ALKPHOS 118*  BILITOT 1.9*  PROT 8.0  ALBUMIN 4.1    Recent Labs Lab 09/24/13 1124  LIPASE 41   No results found for this basename: AMMONIA,  in the last 168 hours CBC:  Recent Labs Lab 09/24/13 1124  WBC 13.1*  NEUTROABS 11.0*  HGB 17.8*  HCT 48.4  MCV 95.1  PLT 309   Cardiac Enzymes: No results found for this basename: CKTOTAL, CKMB, CKMBINDEX, TROPONINI,  in the last 168 hours  BNP (last 3 results) No results found for this basename: PROBNP,  in the last 8760 hours CBG: No results found for this basename: GLUCAP,  in the last 168 hours  Radiological Exams on Admission: US Abdomen Complete  09/24/2013   CLINICAL DATA:  Abdominal pain.  Elevated liver function tests.  EXAM: ULTRASOUND ABDOMEN COMPLETE  COMPARISON:  CT, 08/13/2013  FINDINGS: Gallbladder:  No gallstones or wall thickening visualized. No sonographic Murphy sign noted.  Common bile duct:  Diameter: 6.6 mm.  No evidence of a duct stone.  Liver:  Liver is echogenic with poor through transmission of the sound beam, findings consistent  with extensive fatty infiltration. Liver is borderline enlarged. No liver mass or focal lesion. Hepatopetal flow documented in the portal vein.  IVC:  No abnormality visualized.  Pancreas:  Visualized portion unremarkable.  Spleen:  Size and appearance within normal limits.  Right Kidney:  Length: 11.1 cm. Echogenicity within normal limits. No mass or hydronephrosis visualized.  Left Kidney:  Length: 11.1 cm. Echogenicity within normal limits. No mass or hydronephrosis visualized.  Abdominal aorta:  No aneurysm visualized.  Other findings:  None.  IMPRESSION: 1. Extensive hepatic steatosis. 2. Normal gallbladder.  No acute findings.  No other abnormalities.   Electronically Signed   By: Lajean Manes M.D.   On: 09/24/2013 13:40   Ct Abdomen Pelvis W Contrast  09/24/2013   CLINICAL DATA:  Emesis for over 1 month. Indented for pancreatitis last month.  EXAM: CT ABDOMEN AND PELVIS WITH CONTRAST  TECHNIQUE: Multidetector CT imaging  of the abdomen and pelvis was performed using the standard protocol following bolus administration of intravenous contrast.  CONTRAST:  161mL OMNIPAQUE IOHEXOL 300 MG/ML  SOLN  COMPARISON:  08/13/2013  FINDINGS: Liver is mildly enlarged. There is extensive fatty infiltration. No liver mass or focal lesion.  Pancreas is unremarkable.  There is no evidence of pancreatitis.  Normal spleen and gallbladder. No bile duct dilation. Normal adrenal glands. Normal kidneys, ureters and bladder.  No pathologically enlarged lymph nodes. There are no abnormal fluid collections.  The stomach is unremarkable. Normal small bowel. Normal colon. Appendix is not visualized.  No significant bony abnormality.  IMPRESSION: 1. No acute findings.  No evidence of pancreatitis. 2. Mild hepatomegaly and extensive hepatic steatosis, unchanged. 3. Exam otherwise unremarkable.   Electronically Signed   By: Lajean Manes M.D.   On: 09/24/2013 15:48    EKG: Independently reviewed. None performed  Assessment/Plan     Pancreatitis, acute, cryptogenic-patient has well known heavy alcohol use in the past with hepatic steatosis however I am not clear as to why he still has epigastric tenderness in addition to elevated LFTs. If his LFTs do not improve, we will involve gastroenterology once again to help [place] determine how to proceed. He could have hypertriglyceridemia causing pancreatitis-and repeat his lipid panel as fish oil may not be working in his specific case and he may need a fenofibrate in addition to Crestor. I tend to believe him when he says he has not had alcohol.  For his nausea and vomiting we will schedule Zofran every 6 when necessary. Keep n.p.o. for now---> I would discontinue his Reglan as it has to be used cautiously with liver dysfunction. I consulted Dr. Collene Mares of gastroenterology recommends a drug screen and will have her partner Dr. hung see the patient in the morning-input appreciated in advance   Essential hypertension, benign-blood pressure elevated secondary to pain. Monitor and add medications if elevated   Unlikely urinary tract infection-patient did not capture urine specimen with clean catch. He just urinated into receptacle. Discontinued Rocephin started in the emergency room   Benign paroxysmal positional vertigo-No nystagmus per prior exams   Hepatic steatosis-secondary to ETOH use   Hypokalemia-resolved   UTI (lower urinary tract infection)  Time spent: [place], [facility] Pager [phone]  If 7PM-7AM, please contact night-coverage [email or web address] Password [email or web address] 09/24/2013, 5:10 PM

## 2013-09-24 NOTE — Progress Notes (Signed)
Attempted to call report from ED.  Placed on hold with no answer x 66min.  Will call back.

## 2013-09-24 NOTE — ED Notes (Signed)
MD at bedside. 

## 2013-09-24 NOTE — ED Notes (Signed)
Pt states he has had emesis for over a month, was admitted in January for pancreatitis. Pt states emesis has gotten worse, pt states he has not been able to keep any fluids down for past 3 days.

## 2013-09-24 NOTE — Progress Notes (Signed)
Attempted to get report from ED x3.  Patient's nurse and charge nurse not available.  Was told they will call back.

## 2013-09-24 NOTE — Progress Notes (Signed)
[facility] provided pt with a [employer] application. Patient had the [place] with [employer] and [employer] for 04/06/13-08/15/13.

## 2013-09-24 NOTE — ED Provider Notes (Signed)
CSN: [id number]     Arrival date & time 09/24/13  5102 History   First MD Initiated Contact with Patient 09/24/13 1037     Chief Complaint  Patient presents with  . Emesis  . Diarrhea  . Abdominal Pain     (Consider location/radiation/quality/duration/timing/severity/associated sxs/prior Treatment) HPI Patient admitted to the hospital Dec 58-NID 5 with alcoholic pancreatitis presents with worsening symptoms that feel like previous pancreatitis.  States he never fully recovered and left the hospital with small amount of epigastric pain that has progressively gotten worse, now is severe and constant, worse than the last time, has not been able to tolerate PO x several days.  N/V/D  - emesis is contents of stomach, even small sips of water come back up.  Emesis and diarrhea are nonbloody.  Both described as yellow.  Pain is epigastric, constant, radiates through to the back, described as knife-like.  Radiation to back, occasionally into chest. Worse with eating.  Denies urinary symptoms but notes urination has decreased.  Family member notes 20-30 pound weight gain over the past 1-2 months.  Has swelling in his hands, but nowhere else.  Denies back pain, leg pain or swelling, SOB.   Pt denies any recent alcohol or drug use (since December).   Past Medical History  Diagnosis Date  . Hypertension   . Vertigo   . Ulcer   . Pancreatitis, acute 08/14/2013   History reviewed. No pertinent past surgical history. Family History  Problem Relation Age of Onset  . High blood pressure Mother   . High blood pressure Father   . Migraines Paternal Grandmother    History  Substance Use Topics  . Smoking status: Current Every Day Smoker -- 1.00 packs/day for .5 years    Types: Cigarettes  . Smokeless tobacco: Never Used  . Alcohol Use: No    Review of Systems  Constitutional: Positive for unexpected weight change. Negative for fever and chills.  Respiratory: Negative for cough and shortness of  breath.   Gastrointestinal: Positive for nausea, vomiting, abdominal pain and diarrhea.  Genitourinary: Positive for decreased urine volume. Negative for dysuria, urgency and frequency.  Skin: Negative for rash.  Psychiatric/Behavioral: Positive for dysphoric mood.  All other systems reviewed and are negative.      Allergies  Review of patient's allergies indicates no known allergies.  Home Medications   Current Outpatient Rx  Name  Route  Sig  Dispense  Refill  . bismuth subsalicylate (PEPTO BISMOL) 262 MG/15ML suspension   Oral   Take 30 mLs by mouth every 6 (six) hours as needed for indigestion.         Marland Kitchen loperamide (IMODIUM) 1 MG/5ML solution   Oral   Take 2 mg by mouth daily as needed for diarrhea or loose stools.         . metoCLOPramide (REGLAN) 10 MG tablet   Oral   Take 1 tablet (10 mg total) by mouth every 6 (six) hours as needed for nausea or vomiting.   30 tablet   1   . pantoprazole (PROTONIX) 40 MG tablet   Oral   Take 1 tablet (40 mg total) by mouth daily.   30 tablet   0   . promethazine (PHENERGAN) 25 MG suppository   Rectal   Place 1 suppository (25 mg total) rectally every 6 (six) hours as needed for nausea or vomiting.   10 each   0   . traMADol (ULTRAM) 50 MG tablet   Oral  Take 100 mg by mouth every 6 (six) hours as needed for moderate pain.          BP 127/85  Pulse 128  Temp(Src) 98.2 F (36.8 C) (Oral)  Resp 20  SpO2 97% Physical Exam  Nursing note and vitals reviewed. Constitutional: He appears well-developed and well-nourished. No distress.  HENT:  Head: Normocephalic and atraumatic.  Neck: Neck supple.  Cardiovascular: Normal rate and regular rhythm.   Pulmonary/Chest: Effort normal and breath sounds normal. No respiratory distress. He has no wheezes. He has no rales.  Abdominal: Soft. He exhibits no distension and no mass. There is tenderness in the epigastric area. There is no rebound, no guarding and no CVA  tenderness.  obese  Neurological: He is alert. He exhibits normal muscle tone.  Skin: He is not diaphoretic.    ED Course  Procedures (including critical care time) Labs Review Labs Reviewed  CBC WITH DIFFERENTIAL - Abnormal; Notable for the following:    WBC 13.1 (*)    Hemoglobin 17.8 (*)    MCH 35.0 (*)    MCHC 36.8 (*)    Neutrophils Relative % 84 (*)    Neutro Abs 11.0 (*)    Lymphocytes Relative 11 (*)    All other components within normal limits  COMPREHENSIVE METABOLIC PANEL - Abnormal; Notable for the following:    Potassium 3.5 (*)    Glucose, Bld 146 (*)    BUN 5 (*)    AST 223 (*)    ALT 211 (*)    Alkaline Phosphatase 118 (*)    Total Bilirubin 1.9 (*)    All other components within normal limits  URINALYSIS, ROUTINE W REFLEX MICROSCOPIC - Abnormal; Notable for the following:    Color, Urine ORANGE (*)    APPearance TURBID (*)    Bilirubin Urine MODERATE (*)    Ketones, ur 15 (*)    Protein, ur 100 (*)    Nitrite POSITIVE (*)    Leukocytes, UA SMALL (*)    All other components within normal limits  URINE MICROSCOPIC-ADD ON - Abnormal; Notable for the following:    Squamous Epithelial / LPF MANY (*)    Bacteria, UA MANY (*)    Casts HYALINE CASTS (*)    All other components within normal limits  URINE CULTURE  LIPASE, BLOOD  SALICYLATE LEVEL  LACTIC ACID, PLASMA   Imaging Review US Abdomen Complete  09/24/2013   CLINICAL DATA:  Abdominal pain.  Elevated liver function tests.  EXAM: ULTRASOUND ABDOMEN COMPLETE  COMPARISON:  CT, 08/13/2013  FINDINGS: Gallbladder:  No gallstones or wall thickening visualized. No sonographic Murphy sign noted.  Common bile duct:  Diameter: 6.6 mm.  No evidence of a duct stone.  Liver:  Liver is echogenic with poor through transmission of the sound beam, findings consistent with extensive fatty infiltration. Liver is borderline enlarged. No liver mass or focal lesion. Hepatopetal flow documented in the portal vein.  IVC:  No  abnormality visualized.  Pancreas:  Visualized portion unremarkable.  Spleen:  Size and appearance within normal limits.  Right Kidney:  Length: 11.1 cm. Echogenicity within normal limits. No mass or hydronephrosis visualized.  Left Kidney:  Length: 11.1 cm. Echogenicity within normal limits. No mass or hydronephrosis visualized.  Abdominal aorta:  No aneurysm visualized.  Other findings:  None.  IMPRESSION: 1. Extensive hepatic steatosis. 2. Normal gallbladder.  No acute findings.  No other abnormalities.   Electronically Signed   By: Dedra Skeens.D.  On: 09/24/2013 13:40   Ct Abdomen Pelvis W Contrast  09/24/2013   CLINICAL DATA:  Emesis for over 1 month. Indented for pancreatitis last month.  EXAM: CT ABDOMEN AND PELVIS WITH CONTRAST  TECHNIQUE: Multidetector CT imaging of the abdomen and pelvis was performed using the standard protocol following bolus administration of intravenous contrast.  CONTRAST:  125mL OMNIPAQUE IOHEXOL 300 MG/ML  SOLN  COMPARISON:  08/13/2013  FINDINGS: Liver is mildly enlarged. There is extensive fatty infiltration. No liver mass or focal lesion.  Pancreas is unremarkable.  There is no evidence of pancreatitis.  Normal spleen and gallbladder. No bile duct dilation. Normal adrenal glands. Normal kidneys, ureters and bladder.  No pathologically enlarged lymph nodes. There are no abnormal fluid collections.  The stomach is unremarkable. Normal small bowel. Normal colon. Appendix is not visualized.  No significant bony abnormality.  IMPRESSION: 1. No acute findings.  No evidence of pancreatitis. 2. Mild hepatomegaly and extensive hepatic steatosis, unchanged. 3. Exam otherwise unremarkable.   Electronically Signed   By: Lajean Manes M.D.   On: 09/24/2013 15:48    EKG Interpretation   None      2:08 PM Discussed patient and plan and reviewed labs with Dr Vanita Panda.  3:58 PM Discussed all results and plan with Dr Vanita Panda.   MDM   Final diagnoses:  UTI (lower urinary tract  infection)  Elevated LFTs  Dehydration  Nausea vomiting and diarrhea  Abdominal pain    Pt with hx alcoholic pancreatitis p/w epigastric pain, N/V/D ongoing and worsening over 1 month.  WBC elevated, UTI present, LFTs elevated.  Nitrite positive UTI with many bacteria.   Lipase is negative.  CT negative for pancreatitis.  US shows normal gallbladder.   Liver is mildly enlarged and there is extensive hepatic steatosis.   Pt denies any alcohol, drug, or methadone use since December.  4:28 PM Discussed patient with Dr Verlon Au, [facility], who will see and admit the patient.      [place], PA-C 09/24/13 1629

## 2013-09-24 NOTE — Progress Notes (Signed)
Attempted to get report from ED x2.  Placed on hold again x3 min.  Will call back.

## 2013-09-25 DIAGNOSIS — R7989 Other specified abnormal findings of blood chemistry: Secondary | ICD-10-CM

## 2013-09-25 DIAGNOSIS — E860 Dehydration: Secondary | ICD-10-CM

## 2013-09-25 DIAGNOSIS — K7689 Other specified diseases of liver: Secondary | ICD-10-CM

## 2013-09-25 DIAGNOSIS — R109 Unspecified abdominal pain: Secondary | ICD-10-CM

## 2013-09-25 DIAGNOSIS — I10 Essential (primary) hypertension: Secondary | ICD-10-CM

## 2013-09-25 LAB — COMPREHENSIVE METABOLIC PANEL
ALBUMIN: 3.3 g/dL — AB (ref 3.5–5.2)
ALT: 146 U/L — ABNORMAL HIGH (ref 0–53)
AST: 115 U/L — AB (ref 0–37)
Alkaline Phosphatase: 92 U/L (ref 39–117)
BUN: 6 mg/dL (ref 6–23)
CALCIUM: 8.1 mg/dL — AB (ref 8.4–10.5)
CHLORIDE: 100 meq/L (ref 96–112)
CO2: 24 mEq/L (ref 19–32)
CREATININE: 1.06 mg/dL (ref 0.50–1.35)
GFR calc Af Amer: >90 mL/min (ref >90)
GFR calc non Af Amer: >90 mL/min (ref >90)
GLUCOSE: 110 mg/dL — AB (ref 70–99)
POTASSIUM: 3.4 meq/L — AB (ref 3.7–5.3)
SODIUM: 138 meq/L (ref 137–147)
Total Bilirubin: 1.4 mg/dL — ABNORMAL HIGH (ref 0.3–1.2)
Total Protein: 6.3 g/dL (ref 6.0–8.3)

## 2013-09-25 LAB — URINE CULTURE: Colony Count: 60000

## 2013-09-25 LAB — CBC
HEMATOCRIT: 41.5 % (ref 39.0–52.0)
HEMOGLOBIN: 14.8 g/dL (ref 13.0–17.0)
MCH: 34.7 pg — ABNORMAL HIGH (ref 26.0–34.0)
MCHC: 35.7 g/dL (ref 30.0–36.0)
MCV: 97.4 fL (ref 78.0–100.0)
Platelets: 237 10*3/uL (ref 150–400)
RBC: 4.26 MIL/uL (ref 4.22–5.81)
RDW: 14.7 % (ref 11.5–15.5)
WBC: 7.6 10*3/uL (ref 4.0–10.5)

## 2013-09-25 LAB — PROTIME-INR
INR: 1.03 (ref 0.00–1.49)
PROTHROMBIN TIME: 13.3 s (ref 11.6–15.2)

## 2013-09-25 LAB — HEPATITIS PANEL, ACUTE
HCV AB: NEGATIVE
HEP A IGM: NONREACTIVE
HEP B S AG: NEGATIVE
Hep B C IgM: NONREACTIVE

## 2013-09-25 MED ORDER — MORPHINE SULFATE 2 MG/ML IJ SOLN
2.0000 mg | INTRAMUSCULAR | Status: DC | PRN
  Administered 2013-09-25 – 2013-09-26 (×4): 2 mg via INTRAVENOUS
  Filled 2013-09-25 (×4): qty 1

## 2013-09-25 MED ORDER — PROMETHAZINE HCL 25 MG/ML IJ SOLN
12.5000 mg | Freq: Once | INTRAMUSCULAR | Status: AC
  Administered 2013-09-25: 12.5 mg via INTRAVENOUS
  Filled 2013-09-25: qty 1

## 2013-09-25 NOTE — Care Management Note (Addendum)
    Page 1 of 1   09/28/2013     11:20:17 AM   CARE MANAGEMENT NOTE 09/28/2013  Patient:  Richard House, Richard House   Account Number:  [id number]  Date Initiated:  09/25/2013  Documentation initiated by:  Dessa Phi  Subjective/Objective Assessment:   28 Y/O M ADMITTED W/ABD PAIN.ACUTE PANCREATITIS.     Action/Plan:   FROM HOME.[place].   Anticipated DC Date:  09/29/2013   Anticipated DC Plan:  HOME/SELF CARE  In-house referral  Financial Counselor      DC Planning Services  CM consult      Choice offered to / List presented to:             Status of service:  In process, will continue to follow Medicare Important Message given?   (If response is "NO", the following Medicare IM given date fields will be blank) Date Medicare IM given:   Date Additional Medicare IM given:    Discharge Disposition:    Per UR Regulation:  Reviewed for med. necessity/level of care/duration of stay  If discussed at [employer] of Stay Meetings, dates discussed:   09/28/2013    Comments:  09/28/13 Tresten Pantoja RN,BSN NCM [phone] IMPROVING.ANTICIPATE D/C IN AM.PCP APPT ALREADY SET.NO ANTICIPATED D/C NEEDS.  09/25/13 Caryle Helgeson RN,BSN NCM [phone] PCP APPT SET-SEE D/C INSTRUCTION.NO FURTHER D/C NEEDS.

## 2013-09-25 NOTE — Progress Notes (Signed)
[facility] PROGRESS NOTE  Richard House ZWC:[id number] DOB: November 29, 1985 DOA: 09/24/2013 PCP: Richard Chessman, MD  Assessment/Plan: 1. Pancreatitis 1. GI consulted 2. Presenting lipase unremarkable at 41 3. Currently NPO with IVF 4. Continue supportive care 5. LFT's unremarkable 2. Transaminitis 1. GI per above 2. Fatty liver per abd [place] 3. On statin 4. Will check acute hepatitis panel 3. HTN 1. Stable 4. UTI? 1. Specimen not a clean catch in ED 2. Follow cx for now and monitor for sx and fevers 3. If pt develops sx of UTI, consider resuming empiric abx 5. Hx vertigo 1. Stable 6. DVT prophylaxis 1. Heparin  Code Status: Full Family Communication: Pt in room (indicate person spoken with, relationship, and if by phone, the number) Disposition Plan: Pending   Consultants:  GI  Procedures:  abd [place]  Antibiotics:    HPI/Subjective: No acute events noted overnight  Objective: Filed Vitals:   09/24/13 1741 09/24/13 1810 09/24/13 2236 09/25/13 0503  BP:  142/89 143/91 137/89  Pulse: 87 105 87 91  Temp:  98.3 F (36.8 C) 98.1 F (36.7 C) 98.4 F (36.9 C)  TempSrc:   Oral Oral  Resp:  20 20 24   Height:  6' (1.829 m)    Weight:  104.146 kg (229 lb 9.6 oz)    SpO2: 95% 97% 96% 98%    Intake/Output Summary (Last 24 hours) at 09/25/13 0842 Last data filed at 09/25/13 0700  Gross per 24 hour  Intake 1635.42 ml  Output      0 ml  Net 1635.42 ml   Filed Weights   09/24/13 1810  Weight: 104.146 kg (229 lb 9.6 oz)    Exam:   General:  Awake, in nad  Cardiovascular: regular, s1, s2  Respiratory: normal resp effort, no wheezing  Abdomen: soft, tender on minimal palpation  Musculoskeletal: perfused, no clubbing   Data Reviewed: Basic Metabolic Panel:  Recent Labs Lab 09/24/13 1124 09/25/13 0455  NA 139 138  K 3.5* 3.4*  CL 97 100  CO2 19 24  GLUCOSE 146* 110*  BUN 5* 6  CREATININE 1.05 1.06  CALCIUM 9.2 8.1*   Liver Function  Tests:  Recent Labs Lab 09/24/13 1124 09/25/13 0455  AST 223* 115*  ALT 211* 146*  ALKPHOS 118* 92  BILITOT 1.9* 1.4*  PROT 8.0 6.3  ALBUMIN 4.1 3.3*    Recent Labs Lab 09/24/13 1124  LIPASE 41   No results found for this basename: AMMONIA,  in the last 168 hours CBC:  Recent Labs Lab 09/24/13 1124 09/25/13 0455  WBC 13.1* 7.6  NEUTROABS 11.0*  --   HGB 17.8* 14.8  HCT 48.4 41.5  MCV 95.1 97.4  PLT 309 237   Cardiac Enzymes: No results found for this basename: CKTOTAL, CKMB, CKMBINDEX, TROPONINI,  in the last 168 hours BNP (last 3 results) No results found for this basename: PROBNP,  in the last 8760 hours CBG: No results found for this basename: GLUCAP,  in the last 168 hours  No results found for this or any previous visit (from the past 240 hour(s)).   Studies: US Abdomen Complete  09/24/2013   CLINICAL DATA:  Abdominal pain.  Elevated liver function tests.  EXAM: ULTRASOUND ABDOMEN COMPLETE  COMPARISON:  CT, 08/13/2013  FINDINGS: Gallbladder:  No gallstones or wall thickening visualized. No sonographic Murphy sign noted.  Common bile duct:  Diameter: 6.6 mm.  No evidence of a duct stone.  Liver:  Liver is echogenic with poor  through transmission of the sound beam, findings consistent with extensive fatty infiltration. Liver is borderline enlarged. No liver mass or focal lesion. Hepatopetal flow documented in the portal vein.  IVC:  No abnormality visualized.  Pancreas:  Visualized portion unremarkable.  Spleen:  Size and appearance within normal limits.  Right Kidney:  Length: 11.1 cm. Echogenicity within normal limits. No mass or hydronephrosis visualized.  Left Kidney:  Length: 11.1 cm. Echogenicity within normal limits. No mass or hydronephrosis visualized.  Abdominal aorta:  No aneurysm visualized.  Other findings:  None.  IMPRESSION: 1. Extensive hepatic steatosis. 2. Normal gallbladder.  No acute findings.  No other abnormalities.   Electronically Signed   By:  Lajean Manes M.D.   On: 09/24/2013 13:40   Ct Abdomen Pelvis W Contrast  09/24/2013   CLINICAL DATA:  Emesis for over 1 month. Indented for pancreatitis last month.  EXAM: CT ABDOMEN AND PELVIS WITH CONTRAST  TECHNIQUE: Multidetector CT imaging of the abdomen and pelvis was performed using the standard protocol following bolus administration of intravenous contrast.  CONTRAST:  151mL OMNIPAQUE IOHEXOL 300 MG/ML  SOLN  COMPARISON:  08/13/2013  FINDINGS: Liver is mildly enlarged. There is extensive fatty infiltration. No liver mass or focal lesion.  Pancreas is unremarkable.  There is no evidence of pancreatitis.  Normal spleen and gallbladder. No bile duct dilation. Normal adrenal glands. Normal kidneys, ureters and bladder.  No pathologically enlarged lymph nodes. There are no abnormal fluid collections.  The stomach is unremarkable. Normal small bowel. Normal colon. Appendix is not visualized.  No significant bony abnormality.  IMPRESSION: 1. No acute findings.  No evidence of pancreatitis. 2. Mild hepatomegaly and extensive hepatic steatosis, unchanged. 3. Exam otherwise unremarkable.   Electronically Signed   By: Lajean Manes M.D.   On: 09/24/2013 15:48    Scheduled Meds: . heparin  5,000 Units Subcutaneous Q8H  . ondansetron (ZOFRAN) IV  4 mg Intravenous Q6H  . pantoprazole  40 mg Oral Daily  . pneumococcal 23 valent vaccine  0.5 mL Intramuscular Tomorrow-1000  . sodium chloride  3 mL Intravenous Q12H   Continuous Infusions:   Principal Problem:   Pancreatitis, acute, cryptogenic Active Problems:   Essential hypertension, benign   Benign paroxysmal positional vertigo   Nausea with vomiting   Hepatic steatosis   Hypokalemia   UTI (lower urinary tract infection)   Time spent: 73min  Richard House, [facility] Pager [phone]. If 7PM-7AM, please contact night-coverage at [email or web address], password [facility] 09/25/2013, 8:42 AM  LOS: 1 day

## 2013-09-25 NOTE — ED Provider Notes (Signed)
  Medical screening examination/treatment/procedure(s) were performed by non-physician practitioner and as supervising physician I was immediately available for consultation/collaboration.       Carmin Muskrat, MD 09/25/13 0900

## 2013-09-25 NOTE — Consult Note (Signed)
Unassigned Consult  Reason for Consult: ABM pain, history of ETOH pancreatitis, abnormal liver enzymes, and hepatomegaly secondary to steatosis Referring Physician: [facility]  Richard House HPI: This is a 28 year old male with a recent history of ETOH pancreatitis in 08/12/13.  He was suffering from nausea and vomiting for the four months prior secondary to vertigo, but he acutely started to have epigastric pain.  The pain brought him to the hospital and further evaluation revealed an elevation in his lipase, liver enzymes, and mild pancreatitis on CT scan.  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  Upon his discharge in January he still had issues with his nausea and vomiting.  He was readmitted as he was not able to tolerate any PO for the past 4-5 days.  He states that he stopped drinking ETOH since his last visit.  The CT scan during this admission does not reveal any evidence of pancreatitis and his lipase is normal.  Additionally, his lipid panel does show an improvement.  Past Medical History  Diagnosis Date  . Hypertension   . Vertigo   . Ulcer   . Pancreatitis, acute 08/14/2013    History reviewed. No pertinent past surgical history.  Family History  Problem Relation Age of Onset  . High blood pressure Mother   . High blood pressure Father   . Migraines Paternal Grandmother     Social History:  reports that he has been smoking Cigarettes.  He has a .5 pack-year smoking history. He has never used smokeless tobacco. He reports that he uses illicit drugs. He reports that he does not drink alcohol.  Allergies: No Known Allergies  Medications:  Scheduled: . heparin  5,000 Units Subcutaneous Q8H  . ondansetron (ZOFRAN) IV  4 mg Intravenous Q6H  . pantoprazole  40 mg Oral Daily  . sodium chloride  3 mL Intravenous Q12H   Continuous:   Results for orders placed during the hospital encounter of 09/24/13 (from the past 24 hour(s))  SALICYLATE LEVEL      Status: Abnormal   Collection Time    09/24/13  4:38 PM      Result Value Range   Salicylate Lvl 123456 (*) 2.8 - 20.0 mg/dL  LACTIC ACID, PLASMA     Status: None   Collection Time    09/24/13  4:38 PM      Result Value Range   Lactic Acid, Venous 1.3  0.5 - 2.2 mmol/L  LIPID PANEL     Status: None   Collection Time    09/24/13  4:38 PM      Result Value Range   Cholesterol 151  0 - 200 mg/dL   Triglycerides 139  <150 mg/dL   HDL 61  >39 mg/dL   Total CHOL/HDL Ratio 2.5     VLDL 28  0 - 40 mg/dL   LDL Cholesterol 62  0 - 99 mg/dL  URINE RAPID DRUG SCREEN (HOSP PERFORMED)     Status: Abnormal   Collection Time    09/24/13  6:28 PM      Result Value Range   Opiates POSITIVE (*) NONE DETECTED   Cocaine NONE DETECTED  NONE DETECTED   Benzodiazepines NONE DETECTED  NONE DETECTED   Amphetamines NONE DETECTED  NONE DETECTED   Tetrahydrocannabinol NONE DETECTED  NONE DETECTED   Barbiturates NONE DETECTED  NONE DETECTED  COMPREHENSIVE METABOLIC PANEL     Status: Abnormal   Collection Time    09/25/13  4:55 AM      Result Value Range   Sodium 138  137 - 147 mEq/L   Potassium 3.4 (*) 3.7 - 5.3 mEq/L   Chloride 100  96 - 112 mEq/L   CO2 24  19 - 32 mEq/L   Glucose, Bld 110 (*) 70 - 99 mg/dL   BUN 6  6 - 23 mg/dL   Creatinine, Ser 1.06  0.50 - 1.35 mg/dL   Calcium 8.1 (*) 8.4 - 10.5 mg/dL   Total Protein 6.3  6.0 - 8.3 g/dL   Albumin 3.3 (*) 3.5 - 5.2 g/dL   AST 115 (*) 0 - 37 U/L   ALT 146 (*) 0 - 53 U/L   Alkaline Phosphatase 92  39 - 117 U/L   Total Bilirubin 1.4 (*) 0.3 - 1.2 mg/dL   GFR calc non Af Amer >90  >90 mL/min   GFR calc Af Amer >90  >90 mL/min  PROTIME-INR     Status: None   Collection Time    09/25/13  4:55 AM      Result Value Range   Prothrombin Time 13.3  11.6 - 15.2 seconds   INR 1.03  0.00 - 1.49  CBC     Status: Abnormal   Collection Time    09/25/13  4:55 AM      Result Value Range   WBC 7.6  4.0 - 10.5 K/uL   RBC 4.26  4.22 - 5.81 MIL/uL    Hemoglobin 14.8  13.0 - 17.0 g/dL   HCT 41.5  39.0 - 52.0 %   MCV 97.4  78.0 - 100.0 fL   MCH 34.7 (*) 26.0 - 34.0 pg   MCHC 35.7  30.0 - 36.0 g/dL   RDW 14.7  11.5 - 15.5 %   Platelets 237  150 - 400 K/uL     US Abdomen Complete  09/24/2013   CLINICAL DATA:  Abdominal pain.  Elevated liver function tests.  EXAM: ULTRASOUND ABDOMEN COMPLETE  COMPARISON:  CT, 08/13/2013  FINDINGS: Gallbladder:  No gallstones or wall thickening visualized. No sonographic Murphy sign noted.  Common bile duct:  Diameter: 6.6 mm.  No evidence of a duct stone.  Liver:  Liver is echogenic with poor through transmission of the sound beam, findings consistent with extensive fatty infiltration. Liver is borderline enlarged. No liver mass or focal lesion. Hepatopetal flow documented in the portal vein.  IVC:  No abnormality visualized.  Pancreas:  Visualized portion unremarkable.  Spleen:  Size and appearance within normal limits.  Right Kidney:  Length: 11.1 cm. Echogenicity within normal limits. No mass or hydronephrosis visualized.  Left Kidney:  Length: 11.1 cm. Echogenicity within normal limits. No mass or hydronephrosis visualized.  Abdominal aorta:  No aneurysm visualized.  Other findings:  None.  IMPRESSION: 1. Extensive hepatic steatosis. 2. Normal gallbladder.  No acute findings.  No other abnormalities.   Electronically Signed   By: Lajean Manes M.D.   On: 09/24/2013 13:40   Ct Abdomen Pelvis W Contrast  09/24/2013   CLINICAL DATA:  Emesis for over 1 month. Indented for pancreatitis last month.  EXAM: CT ABDOMEN AND PELVIS WITH CONTRAST  TECHNIQUE: Multidetector CT imaging of the abdomen and pelvis was performed using the standard protocol following bolus administration of intravenous contrast.  CONTRAST:  1105mL OMNIPAQUE IOHEXOL 300 MG/ML  SOLN  COMPARISON:  08/13/2013  FINDINGS: Liver is mildly enlarged. There is extensive fatty infiltration. No liver mass or focal lesion.  Pancreas is  unremarkable.  There is no  evidence of pancreatitis.  Normal spleen and gallbladder. No bile duct dilation. Normal adrenal glands. Normal kidneys, ureters and bladder.  No pathologically enlarged lymph nodes. There are no abnormal fluid collections.  The stomach is unremarkable. Normal small bowel. Normal colon. Appendix is not visualized.  No significant bony abnormality.  IMPRESSION: 1. No acute findings.  No evidence of pancreatitis. 2. Mild hepatomegaly and extensive hepatic steatosis, unchanged. 3. Exam otherwise unremarkable.   Electronically Signed   By: Lajean Manes M.D.   On: 09/24/2013 15:48    ROS:  As stated above in the HPI otherwise negative.  Blood pressure 135/73, pulse 59, temperature 97.9 F (36.6 C), temperature source Oral, resp. rate 20, height 6' (1.829 m), weight 229 lb 9.6 oz (104.146 kg), SpO2 99.00%.    PE: Gen: NAD, Alert and Oriented HEENT:  /AT, EOMI Neck: Supple, no LAD Lungs: CTA Bilaterally CV: RRR without M/G/R ABM: Soft, epigastric tenderness, liver edge 6 cm below the costal margin, +BS Ext: No C/C/E  Assessment/Plan: 1) ? Pancreatitis. 2) Hepatomegaly secondary to fatty infiltration. 3) Nausea and vomiting. 4) Abnormal liver enzymes.   The patient does not have objective evidence of pancreatitis, i.e., labs or imaging, but his clinical presentation is consistent with this etiology.  Certainly his pain is still the same as his prior admission to the hospital.  His liver enzymes acutely elevated, but there is no evidence of any stones on the CT scan or ductal dilation.  He has a significant family history of gallbladder disease in his mother, grandmother, and another second degree relative.    Plan: 1) Treat as if he has pancreatitis - IV hydration, pain control, and NPO. 2) Stop Reglan.  There is no role for this medication in his current state. 3) ? EUS on Thursday to see if he has any evidence of microlithiasis.  Richard House D 09/25/2013, 3:31 PM

## 2013-09-25 NOTE — Progress Notes (Signed)
Went into patient room to check on him and when opened the door all you could smell was smoke. Ask the patient if he was smoking and patient said that he had a burnt cigarette in his jacket. Came out to let charge nurse know that he was smoking, we then went in there and took the cigarettes from him. They were placed in the wall a ru. AC was made aware of the situation. We explained to the patient that it was not safe to use the cigarettes in the hospital. Richard House D

## 2013-09-26 ENCOUNTER — Ambulatory Visit: Payer: No Typology Code available for payment source

## 2013-09-26 DIAGNOSIS — R112 Nausea with vomiting, unspecified: Secondary | ICD-10-CM

## 2013-09-26 DIAGNOSIS — K859 Acute pancreatitis without necrosis or infection, unspecified: Principal | ICD-10-CM

## 2013-09-26 LAB — HEPATIC FUNCTION PANEL
ALT: 147 U/L — AB (ref 0–53)
AST: 175 U/L — ABNORMAL HIGH (ref 0–37)
Albumin: 3.6 g/dL (ref 3.5–5.2)
Alkaline Phosphatase: 92 U/L (ref 39–117)
TOTAL PROTEIN: 7.1 g/dL (ref 6.0–8.3)
Total Bilirubin: 1 mg/dL (ref 0.3–1.2)

## 2013-09-26 LAB — SEDIMENTATION RATE: Sed Rate: 15 mm/hr (ref 0–16)

## 2013-09-26 MED ORDER — PROMETHAZINE HCL 25 MG/ML IJ SOLN
25.0000 mg | Freq: Four times a day (QID) | INTRAMUSCULAR | Status: DC | PRN
  Administered 2013-09-26: 25 mg via INTRAVENOUS
  Filled 2013-09-26: qty 1

## 2013-09-26 MED ORDER — OXYCODONE HCL 5 MG PO TABS
10.0000 mg | ORAL_TABLET | Freq: Four times a day (QID) | ORAL | Status: DC | PRN
  Administered 2013-09-26 (×2): 10 mg via ORAL
  Filled 2013-09-26 (×2): qty 2

## 2013-09-26 MED ORDER — POTASSIUM CHLORIDE CRYS ER 20 MEQ PO TBCR
40.0000 meq | EXTENDED_RELEASE_TABLET | Freq: Once | ORAL | Status: AC
  Administered 2013-09-26: 40 meq via ORAL
  Filled 2013-09-26: qty 2

## 2013-09-26 MED ORDER — OXYCODONE HCL 5 MG PO TABS
10.0000 mg | ORAL_TABLET | ORAL | Status: DC | PRN
  Administered 2013-09-27: 10 mg via ORAL
  Filled 2013-09-26: qty 2

## 2013-09-26 MED ORDER — HYDROMORPHONE HCL PF 1 MG/ML IJ SOLN
1.0000 mg | Freq: Once | INTRAMUSCULAR | Status: AC
  Administered 2013-09-26: 1 mg via INTRAVENOUS
  Filled 2013-09-26: qty 1

## 2013-09-26 MED ORDER — SODIUM CHLORIDE 0.9 % IV SOLN
INTRAVENOUS | Status: DC
  Administered 2013-09-26 – 2013-09-28 (×5): via INTRAVENOUS

## 2013-09-26 NOTE — Progress Notes (Signed)
[facility] PROGRESS NOTE  Richard House WPY:[id number] DOB: [birth year] DOA: 09/24/2013 PCP: [name], MD  Assessment/Plan: 1. Suspected pancreatitis. Patient was seen and evaluated by gastroenterology, recommending IV hydration, pain control, bowel rest. Although lipase was negative, his clinical presentation seems most consistent with pancreatitis. GI considering endoscopic ultrasound possibly on Thursday to evaluate possibility of microlithiasis. Patient this morning requesting a diet. We'll start clears for now. 2. Elevated transaminases. Patient having fatty liver based on ultrasound. Liver enzymes are trending down. Urine drug screen showed only the presence of opiates. Could be secondary to alcoholic liver disease, GI consulted, considering endoscopic ultrasound.  3. Nausea vomiting. Could be secondary to pancreatitis, which seems improved this morning. Continue providing IV fluids, as needed antiemetic therapy, await further recommendations from gastroenterology. 4. Gastroesophageal reflux disease. Continue PPI therapy  Code Status: Full code Disposition Plan: Continue supportive care, IV fluids, providing clear liquids today.   Consultants:  Gastroenterology   Antibiotics:  None  HPI/Subjective: Patient is a pleasant 28 year old gentleman with a past medical history drug abuse, alcohol abuse, admitted to the medicine service on 09/24/2013, presenting with complaints of nausea, vomiting, epigastric pain. 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 This morning patient denied nausea vomiting, requesting for study events. He does complain of ongoing abdominal pain stating that IV dilaudid worked better than IV morphine. During my cancer he did not appear to be in severe distress, as I found him resting comfortably.   Objective: Filed Vitals:   09/26/13 0407  BP: 127/71  Pulse: 72  Temp: 98 F (36.7 C)  Resp: 20     Intake/Output Summary (Last 24 hours) at 09/26/13 0952 Last data filed at 09/25/13 1900  Gross per 24 hour  Intake      0 ml  Output      0 ml  Net      0 ml   Filed Weights   09/24/13 1810  Weight: 104.146 kg (229 lb 9.6 oz)    Exam:   General:  Patient does not appear to be in acute distress, found resting comfortably in bed.  Cardiovascular:  Regular rate and rhythm normal S1-S2 no murmurs or gallop  Respiratory: Clear to auscultation bilaterally, normal respiratory effort  Abdomen: He has generalized abdominal pain, worse in the epigastric region, no rebound tenderness or guarding  Musculoskeletal: No edema  Data Reviewed: Basic Metabolic Panel:  Recent Labs Lab 09/24/13 1124 09/25/13 0455  NA 139 138  K 3.5* 3.4*  CL 97 100  CO2 19 24  GLUCOSE 146* 110*  BUN 5* 6  CREATININE 1.05 1.06  CALCIUM 9.2 8.1*   Liver Function Tests:  Recent Labs Lab 09/24/13 1124 09/25/13 0455  AST 223* 115*  ALT 211* 146*  ALKPHOS 118* 92  BILITOT 1.9* 1.4*  PROT 8.0 6.3  ALBUMIN 4.1 3.3*    Recent Labs Lab 09/24/13 1124  LIPASE 41   No results found for this basename: AMMONIA,  in the last 168 hours CBC:  Recent Labs Lab 09/24/13 1124 09/25/13 0455  WBC 13.1* 7.6  NEUTROABS 11.0*  --   HGB 17.8* 14.8  HCT 48.4 41.5  MCV 95.1 97.4  PLT 309 237   Cardiac Enzymes: No results found for this basename: CKTOTAL, CKMB, CKMBINDEX, TROPONINI,  in the last 168 hours BNP (last 3 results) No results found for this basename: PROBNP,  in the last 8760 hours CBG: No results found for this basename:  GLUCAP,  in the last 168 hours  Recent Results (from the past 240 hour(s))  URINE CULTURE     Status: None   Collection Time    09/24/13 11:27 AM      Result Value Ref Range Status   Specimen Description URINE, CLEAN CATCH   Final   Special Requests NONE   Final   Culture  Setup Time     Final   Value: 09/24/2013 14:59     Performed at [facility]     Final   Value: 60,000 COLONIES/ML     Performed at [facility]   Culture     Final   Value: LACTOBACILLUS SPECIES     Note: Standardized susceptibility testing for this organism is not available.     Performed at [facility]   Report Status 09/25/2013 FINAL   Final     Studies: US Abdomen Complete  09/24/2013   CLINICAL DATA:  Abdominal pain.  Elevated liver function tests.  EXAM: ULTRASOUND ABDOMEN COMPLETE  COMPARISON:  CT, 08/13/2013  FINDINGS: Gallbladder:  No gallstones or wall thickening visualized. No sonographic Murphy sign noted.  Common bile duct:  Diameter: 6.6 mm.  No evidence of a duct stone.  Liver:  Liver is echogenic with poor through transmission of the sound beam, findings consistent with extensive fatty infiltration. Liver is borderline enlarged. No liver mass or focal lesion. Hepatopetal flow documented in the portal vein.  IVC:  No abnormality visualized.  Pancreas:  Visualized portion unremarkable.  Spleen:  Size and appearance within normal limits.  Right Kidney:  Length: 11.1 cm. Echogenicity within normal limits. No mass or hydronephrosis visualized.  Left Kidney:  Length: 11.1 cm. Echogenicity within normal limits. No mass or hydronephrosis visualized.  Abdominal aorta:  No aneurysm visualized.  Other findings:  None.  IMPRESSION: 1. Extensive hepatic steatosis. 2. Normal gallbladder.  No acute findings.  No other abnormalities.   Electronically Signed   By: Amie Portland M.D.   On: 09/24/2013 13:40   Ct Abdomen Pelvis W Contrast  09/24/2013   CLINICAL DATA:  Emesis for over 1 month. Indented for pancreatitis last month.  EXAM: CT ABDOMEN AND PELVIS WITH CONTRAST  TECHNIQUE: Multidetector CT imaging of the abdomen and pelvis was performed using the standard protocol following bolus administration of intravenous contrast.  CONTRAST:  OMNIPAQUE IOHEXOL 300 MG/ML  SOLN  COMPARISON:  08/13/2013  FINDINGS: Liver is mildly enlarged. There is  extensive fatty infiltration. No liver mass or focal lesion.  Pancreas is unremarkable.  There is no evidence of pancreatitis.  Normal spleen and gallbladder. No bile duct dilation. Normal adrenal glands. Normal kidneys, ureters and bladder.  No pathologically enlarged lymph nodes. There are no abnormal fluid collections.  The stomach is unremarkable. Normal small bowel. Normal colon. Appendix is not visualized.  No significant bony abnormality.  IMPRESSION: 1. No acute findings.  No evidence of pancreatitis. 2. Mild hepatomegaly and extensive hepatic steatosis, unchanged. 3. Exam otherwise unremarkable.   Electronically Signed   By: Amie Portland M.D.   On: 09/24/2013 15:48    Scheduled Meds: . heparin  5,000 Units Subcutaneous 3 times per day  . ondansetron (ZOFRAN) IV  4 mg Intravenous 4 times per day  . pantoprazole  40 mg Oral Daily  . potassium chloride  40 mEq Oral Once  . sodium chloride  3 mL Intravenous Q12H   Continuous Infusions: . sodium chloride  Principal Problem:   Pancreatitis, acute, cryptogenic Active Problems:   Essential hypertension, benign   Benign paroxysmal positional vertigo   Nausea with vomiting   Hepatic steatosis   Hypokalemia   UTI (lower urinary tract infection)    Time spent: 35 minutes    Kelvin Cellar  [facility] Pager [phone]. If 7PM-7AM, please contact night-coverage at [email or web address], password [facility] 09/26/2013, 9:52 AM  LOS: 2 days

## 2013-09-26 NOTE — Progress Notes (Signed)
Notified by central tele that pt converted from NSR to A. Flutter then back to NSR. Pt was noted walking in the hallway at the time of call. Pt escorted back to his room. V/S obtained and WNL 146/90, HR 82, RR 20, o2 sat 100% on RA. No complain of palpitation or chest pain. Coralyn Pear, MD is aware, no ekg order. Will continue to monitor

## 2013-09-26 NOTE — Progress Notes (Signed)
Subjective: Still with pain.  Tolerated some PO.  Objective: Vital signs in last 24 hours: Temp:  [97.9 F (36.6 C)-98.3 F (36.8 C)] 98 F (36.7 C) (02/11 0407) Pulse Rate:  [59-82] 82 (02/11 1110) Resp:  [20] 20 (02/11 1110) BP: (127-146)/(71-90) 146/90 mmHg (02/11 1110) SpO2:  [96 %-100 %] 100 % (02/11 1110) Last BM Date: 09/24/13  Intake/Output from previous day:   Intake/Output this shift: Total I/O In: 320 [P.O.:320] Out: -   General appearance: alert and no distress GI: tender in the epigastrium  Lab Results:  Recent Labs  09/24/13 1124 09/25/13 0455  WBC 13.1* 7.6  HGB 17.8* 14.8  HCT 48.4 41.5  PLT 309 237   BMET  Recent Labs  09/24/13 1124 09/25/13 0455  NA 139 138  K 3.5* 3.4*  CL 97 100  CO2 19 24  GLUCOSE 146* 110*  BUN 5* 6  CREATININE 1.05 1.06  CALCIUM 9.2 8.1*   LFT  Recent Labs  09/25/13 0455  PROT 6.3  ALBUMIN 3.3*  AST 115*  ALT 146*  ALKPHOS 92  BILITOT 1.4*   PT/INR  Recent Labs  09/25/13 0455  LABPROT 13.3  INR 1.03   Hepatitis Panel  Recent Labs  09/25/13 1055  HEPBSAG NEGATIVE  HCVAB NEGATIVE  HEPAIGM NON REACTIVE  HEPBIGM NON REACTIVE   C-Diff No results found for this basename: CDIFFTOX,  in the last 72 hours Fecal Lactopherrin No results found for this basename: FECLLACTOFRN,  in the last 72 hours  Studies/Results: US Abdomen Complete  09/24/2013   CLINICAL DATA:  Abdominal pain.  Elevated liver function tests.  EXAM: ULTRASOUND ABDOMEN COMPLETE  COMPARISON:  CT, 08/13/2013  FINDINGS: Gallbladder:  No gallstones or wall thickening visualized. No sonographic Murphy sign noted.  Common bile duct:  Diameter: 6.6 mm.  No evidence of a duct stone.  Liver:  Liver is echogenic with poor through transmission of the sound beam, findings consistent with extensive fatty infiltration. Liver is borderline enlarged. No liver mass or focal lesion. Hepatopetal flow documented in the portal vein.  IVC:  No abnormality  visualized.  Pancreas:  Visualized portion unremarkable.  Spleen:  Size and appearance within normal limits.  Right Kidney:  Length: 11.1 cm. Echogenicity within normal limits. No mass or hydronephrosis visualized.  Left Kidney:  Length: 11.1 cm. Echogenicity within normal limits. No mass or hydronephrosis visualized.  Abdominal aorta:  No aneurysm visualized.  Other findings:  None.  IMPRESSION: 1. Extensive hepatic steatosis. 2. Normal gallbladder.  No acute findings.  No other abnormalities.   Electronically Signed   By: Lajean Manes M.D.   On: 09/24/2013 13:40   Ct Abdomen Pelvis W Contrast  09/24/2013   CLINICAL DATA:  Emesis for over 1 month. Indented for pancreatitis last month.  EXAM: CT ABDOMEN AND PELVIS WITH CONTRAST  TECHNIQUE: Multidetector CT imaging of the abdomen and pelvis was performed using the standard protocol following bolus administration of intravenous contrast.  CONTRAST:  136mL OMNIPAQUE IOHEXOL 300 MG/ML  SOLN  COMPARISON:  08/13/2013  FINDINGS: Liver is mildly enlarged. There is extensive fatty infiltration. No liver mass or focal lesion.  Pancreas is unremarkable.  There is no evidence of pancreatitis.  Normal spleen and gallbladder. No bile duct dilation. Normal adrenal glands. Normal kidneys, ureters and bladder.  No pathologically enlarged lymph nodes. There are no abnormal fluid collections.  The stomach is unremarkable. Normal small bowel. Normal colon. Appendix is not visualized.  No significant bony abnormality.  IMPRESSION: 1. No acute findings.  No evidence of pancreatitis. 2. Mild hepatomegaly and extensive hepatic steatosis, unchanged. 3. Exam otherwise unremarkable.   Electronically Signed   By: Lajean Manes M.D.   On: 09/24/2013 15:48    Medications:  Scheduled: . heparin  5,000 Units Subcutaneous 3 times per day  . ondansetron (ZOFRAN) IV  4 mg Intravenous 4 times per day  . pantoprazole  40 mg Oral Daily  . sodium chloride  3 mL Intravenous Q12H    Continuous: . sodium chloride 125 mL/hr at 09/26/13 1011    Assessment/Plan: 1) Possible pancreatitis.   He was able to tolerate some liquids.  No significant pain with the PO intake.  His liver panel was not drawn this AM and I ordered it for now.  Plan: 1) Await liver panel. 2) Continue with pain control and IV hydration.   LOS: 2 days   Richard House D 09/26/2013, 12:35 PM

## 2013-09-27 ENCOUNTER — Inpatient Hospital Stay (HOSPITAL_COMMUNITY): Payer: No Typology Code available for payment source

## 2013-09-27 DIAGNOSIS — R109 Unspecified abdominal pain: Secondary | ICD-10-CM

## 2013-09-27 LAB — COMPREHENSIVE METABOLIC PANEL
ALBUMIN: 3.1 g/dL — AB (ref 3.5–5.2)
ALT: 127 U/L — AB (ref 0–53)
AST: 131 U/L — AB (ref 0–37)
Alkaline Phosphatase: 83 U/L (ref 39–117)
BUN: 5 mg/dL — ABNORMAL LOW (ref 6–23)
CALCIUM: 7.9 mg/dL — AB (ref 8.4–10.5)
CO2: 21 meq/L (ref 19–32)
CREATININE: 0.88 mg/dL (ref 0.50–1.35)
Chloride: 108 mEq/L (ref 96–112)
GFR calc Af Amer: >90 mL/min (ref >90)
GFR calc non Af Amer: >90 mL/min (ref >90)
Glucose, Bld: 128 mg/dL — ABNORMAL HIGH (ref 70–99)
Potassium: 3.5 mEq/L — ABNORMAL LOW (ref 3.7–5.3)
SODIUM: 141 meq/L (ref 137–147)
Total Bilirubin: 0.6 mg/dL (ref 0.3–1.2)
Total Protein: 5.9 g/dL — ABNORMAL LOW (ref 6.0–8.3)

## 2013-09-27 LAB — CBC
HCT: 39.7 % (ref 39.0–52.0)
HEMOGLOBIN: 14 g/dL (ref 13.0–17.0)
MCH: 34.7 pg — AB (ref 26.0–34.0)
MCHC: 35.3 g/dL (ref 30.0–36.0)
MCV: 98.3 fL (ref 78.0–100.0)
Platelets: 211 10*3/uL (ref 150–400)
RBC: 4.04 MIL/uL — AB (ref 4.22–5.81)
RDW: 14.8 % (ref 11.5–15.5)
WBC: 7.4 10*3/uL (ref 4.0–10.5)

## 2013-09-27 LAB — LIPASE, BLOOD: Lipase: 74 U/L — ABNORMAL HIGH (ref 11–59)

## 2013-09-27 MED ORDER — OXYCODONE HCL 5 MG PO TABS: 10.0000 mg | ORAL_TABLET | Freq: Four times a day (QID) | ORAL | Status: DC | PRN

## 2013-09-27 MED ORDER — OXYCODONE HCL 5 MG PO TABS
10.0000 mg | ORAL_TABLET | ORAL | Status: DC | PRN
  Administered 2013-09-27 – 2013-09-30 (×17): 10 mg via ORAL
  Filled 2013-09-27 (×17): qty 2

## 2013-09-27 MED ORDER — LORAZEPAM 2 MG/ML IJ SOLN
1.0000 mg | Freq: Once | INTRAMUSCULAR | Status: AC
  Administered 2013-09-27: 1 mg via INTRAVENOUS
  Filled 2013-09-27: qty 1

## 2013-09-27 MED ORDER — GADOBENATE DIMEGLUMINE 529 MG/ML IV SOLN
20.0000 mL | Freq: Once | INTRAVENOUS | Status: AC | PRN
  Administered 2013-09-27: 20 mL via INTRAVENOUS

## 2013-09-27 NOTE — Progress Notes (Signed)
Patient refused heparin injection. Told him about the importance of heparin and still refused. He stated that he is ambulating in his room and in the bathroom.

## 2013-09-27 NOTE — Progress Notes (Signed)
[facility] PROGRESS NOTE  Richard House IRC:[id number] DOB: 19-Jul-1986 DOA: 09/24/2013 PCP: [name], MD  Assessment/Plan: 1. Suspected pancreatitis. Patient was seen and evaluated by gastroenterology, recommending IV hydration, pain control, bowel rest. Although lipase was negative, his clinical presentation seems most consistent with pancreatitis. MRCP did not show evidence of gallstones, biliary sludge or biliary tract obstruction. Advancing diet to full liquids.  2. Elevated transaminases. Patient having fatty liver based on ultrasound. Liver enzymes are trending down. Urine drug screen showed only the presence of opiates. 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 Nausea vomiting. Could be secondary to pancreatitis, improved. Continue providing IV fluids, as needed antiemetic therapy, await further recommendations from gastroenterology. 4. Gastroesophageal reflux disease. Continue PPI therapy  Code Status: Full code Disposition Plan: Advancing diet to full liquids.    Consultants:  Gastroenterology   Antibiotics:  None  HPI/Subjective: Patient is a pleasant 28 year old gentleman with a past medical history drug abuse, alcohol abuse, admitted to the medicine service on 09/24/2013, presenting with complaints of nausea, vomiting, epigastric pain. 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 This morning patient denied nausea vomiting, requesting for study events. He does complain of ongoing abdominal pain stating that IV dilaudid worked better than IV morphine. During my encounter he did not appear to be in severe distress, as I found him resting comfortably.  Patient tolerating clears, ambulating the hallway, although complains of ongoing abdominal pain. MRCP today not showing evidence of gall stones or biliary obstruction.   Objective: Filed Vitals:   09/27/13 1300  BP: 122/70  Pulse: 102   Temp: 98.4 F (36.9 C)  Resp: 18    Intake/Output Summary (Last 24 hours) at 09/27/13 1441 Last data filed at 09/27/13 0600  Gross per 24 hour  Intake 2506.25 ml  Output    500 ml  Net 2006.25 ml   Filed Weights   09/24/13 1810  Weight: 104.146 kg (229 lb 9.6 oz)    Exam:   General:  Patient does not appear to be in acute distress, found resting comfortably in bed.  Cardiovascular:  Regular rate and rhythm normal S1-S2 no murmurs or gallop  Respiratory: Clear to auscultation bilaterally, normal respiratory effort  Abdomen: He has generalized abdominal pain, worse in the epigastric region, no rebound tenderness or guarding  Musculoskeletal: No edema  Data Reviewed: Basic Metabolic Panel:  Recent Labs Lab 09/24/13 1124 09/25/13 0455 09/27/13 0535  NA 139 138 141  K 3.5* 3.4* 3.5*  CL 97 100 108  CO2 19 24 21   GLUCOSE 146* 110* 128*  BUN 5* 6 5*  CREATININE 1.05 1.06 0.88  CALCIUM 9.2 8.1* 7.9*   Liver Function Tests:  Recent Labs Lab 09/24/13 1124 09/25/13 0455 09/26/13 1300 09/27/13 0535  AST 223* 115* 175* 131*  ALT 211* 146* 147* 127*  ALKPHOS 118* 92 92 83  BILITOT 1.9* 1.4* 1.0 0.6  PROT 8.0 6.3 7.1 5.9*  ALBUMIN 4.1 3.3* 3.6 3.1*    Recent Labs Lab 09/24/13 1124 09/27/13 0535  LIPASE 41 74*   No results found for this basename: AMMONIA,  in the last 168 hours CBC:  Recent Labs Lab 09/24/13 1124 09/25/13 0455 09/27/13 0535  WBC 13.1* 7.6 7.4  NEUTROABS 11.0*  --   --   HGB 17.8* 14.8 14.0  HCT 48.4 41.5 39.7  MCV 95.1 97.4 98.3  PLT 309 237 211   Cardiac Enzymes: No results  found for this basename: CKTOTAL, CKMB, CKMBINDEX, TROPONINI,  in the last 168 hours BNP (last 3 results) No results found for this basename: PROBNP,  in the last 8760 hours CBG: No results found for this basename: GLUCAP,  in the last 168 hours  Recent Results (from the past 240 hour(s))  URINE CULTURE     Status: None   Collection Time    09/24/13  11:27 AM      Result Value Ref Range Status   Specimen Description URINE, CLEAN CATCH   Final   Special Requests NONE   Final   Culture  Setup Time     Final   Value: 09/24/2013 14:59     Performed at [facility]     Final   Value: 60,000 COLONIES/ML     Performed at [facility]   Culture     Final   Value: LACTOBACILLUS SPECIES     Note: Standardized susceptibility testing for this organism is not available.     Performed at [facility]   Report Status 09/25/2013 FINAL   Final     Studies: Mr 3d Recon At Scanner  10/20/13   CLINICAL DATA:  Nausea, vomiting and diarrhea over 1 month. Recent history of pancreatitis. Evaluate for potential gallstones versus biliary sludge.  EXAM: MR 3D RECON AT SCANNER; MR ABDOMEN WO/W CM MRCP  TECHNIQUE: Multiplanar multisequence MR imaging of the abdomen was performed, including heavily T2-weighted images of the biliary and pancreatic ducts. Three-dimensional MR images were rendered by post processing of the original MR data.  CONTRAST:  15mL MULTIHANCE GADOBENATE DIMEGLUMINE 529 MG/ML IV SOLN  COMPARISON:  CT of the abdomen and pelvis 09/24/2013. Abdominal ultrasound 09/24/2013.  FINDINGS: Out of phase dual echo images demonstrate diffuse heterogeneous loss of signal intensity throughout the hepatic parenchyma, compatible with hepatic steatosis, most severe throughout the right lobe of the liver. No focal hepatic lesions are noted. MRCP images demonstrate no intra or extrahepatic biliary ductal dilatation. No pancreatic ductal dilatation. No definite filling defects within the gallbladder are noted to suggest presence of gallstones or a significant volume of biliary sludge. Common bile duct is normal in caliber and appearance measuring 3 mm in the porta hepatis.  The appearance of the pancreas, spleen, bilateral adrenal glands and bilateral kidneys is unremarkable. Within the visualized portions of the peritoneal cavity  there is no significant volume of ascites. No suspicious lymphadenopathy.  IMPRESSION: 1. No evidence of gallstones. No evidence of biliary sludge. No findings to suggest biliary tract obstruction. 2. Severe hepatic steatosis, as above.   Electronically Signed   By: Vinnie Langton M.D.   On: 10/20/2013 12:28   Mr Abd W/wo Cm/mrcp  10-20-2013   CLINICAL DATA:  Nausea, vomiting and diarrhea over 1 month. Recent history of pancreatitis. Evaluate for potential gallstones versus biliary sludge.  EXAM: MR 3D RECON AT SCANNER; MR ABDOMEN WO/W CM MRCP  TECHNIQUE: Multiplanar multisequence MR imaging of the abdomen was performed, including heavily T2-weighted images of the biliary and pancreatic ducts. Three-dimensional MR images were rendered by post processing of the original MR data.  CONTRAST:  44mL MULTIHANCE GADOBENATE DIMEGLUMINE 529 MG/ML IV SOLN  COMPARISON:  CT of the abdomen and pelvis 09/24/2013. Abdominal ultrasound 09/24/2013.  FINDINGS: Out of phase dual echo images demonstrate diffuse heterogeneous loss of signal intensity throughout the hepatic parenchyma, compatible with hepatic steatosis, most severe throughout the right lobe of the liver. No focal hepatic lesions  are noted. MRCP images demonstrate no intra or extrahepatic biliary ductal dilatation. No pancreatic ductal dilatation. No definite filling defects within the gallbladder are noted to suggest presence of gallstones or a significant volume of biliary sludge. Common bile duct is normal in caliber and appearance measuring 3 mm in the porta hepatis.  The appearance of the pancreas, spleen, bilateral adrenal glands and bilateral kidneys is unremarkable. Within the visualized portions of the peritoneal cavity there is no significant volume of ascites. No suspicious lymphadenopathy.  IMPRESSION: 1. No evidence of gallstones. No evidence of biliary sludge. No findings to suggest biliary tract obstruction. 2. Severe hepatic steatosis, as above.    Electronically Signed   By: Vinnie Langton M.D.   On: 09/27/2013 12:28    Scheduled Meds: . heparin  5,000 Units Subcutaneous 3 times per day  . ondansetron (ZOFRAN) IV  4 mg Intravenous 4 times per day  . pantoprazole  40 mg Oral Daily  . sodium chloride  3 mL Intravenous Q12H   Continuous Infusions: . sodium chloride 125 mL/hr at 09/27/13 0453    Principal Problem:   Pancreatitis, acute, cryptogenic Active Problems:   Essential hypertension, benign   Benign paroxysmal positional vertigo   Nausea with vomiting   Hepatic steatosis   Hypokalemia   UTI (lower urinary tract infection)    Time spent: 35 minutes    Kelvin Cellar  [facility] Pager [phone]. If 7PM-7AM, please contact night-coverage at [email or web address], password [facility] 09/27/2013, 2:41 PM  LOS: 3 days

## 2013-09-27 NOTE — Progress Notes (Signed)
Subjective: Still in a lot of pain, but he is improving.  He is able to tolerate PO without significant pain.  Objective: Vital signs in last 24 hours: Temp:  [97.9 F (36.6 C)-98.4 F (36.9 C)] 98.4 F (36.9 C) (02/12 1300) Pulse Rate:  [76-102] 102 (02/12 1300) Resp:  [18-20] 18 (02/12 1300) BP: (120-137)/(65-91) 122/70 mmHg (02/12 1300) SpO2:  [97 %-98 %] 98 % (02/12 1300) Last BM Date: 09/24/13  Intake/Output from previous day: 02/11 0701 - 02/12 0700 In: 3266.3 [P.O.:1360; I.V.:1906.3] Out: 500 [Urine:500] Intake/Output this shift: Total I/O In: 1125 [I.V.:1125] Out: -   General appearance: alert and no distress GI: epigastric tenderness  Lab Results:  Recent Labs  09/25/13 0455 09/27/13 0535  WBC 7.6 7.4  HGB 14.8 14.0  HCT 41.5 39.7  PLT 237 211   BMET  Recent Labs  09/25/13 0455 09/27/13 0535  NA 138 141  K 3.4* 3.5*  CL 100 108  CO2 24 21  GLUCOSE 110* 128*  BUN 6 5*  CREATININE 1.06 0.88  CALCIUM 8.1* 7.9*   LFT  Recent Labs  09/26/13 1300 09/27/13 0535  PROT 7.1 5.9*  ALBUMIN 3.6 3.1*  AST 175* 131*  ALT 147* 127*  ALKPHOS 92 83  BILITOT 1.0 0.6  BILIDIR <0.2  --   IBILI NOT CALCULATED  --    PT/INR  Recent Labs  09/25/13 0455  LABPROT 13.3  INR 1.03   Hepatitis Panel  Recent Labs  09/25/13 1055  HEPBSAG NEGATIVE  HCVAB NEGATIVE  HEPAIGM NON REACTIVE  HEPBIGM NON REACTIVE   C-Diff No results found for this basename: CDIFFTOX,  in the last 72 hours Fecal Lactopherrin No results found for this basename: FECLLACTOFRN,  in the last 72 hours  Studies/Results: Mr 3d Recon At Scanner  09/27/2013   CLINICAL DATA:  Nausea, vomiting and diarrhea over 1 month. Recent history of pancreatitis. Evaluate for potential gallstones versus biliary sludge.  EXAM: MR 3D RECON AT SCANNER; MR ABDOMEN WO/W CM MRCP  TECHNIQUE: Multiplanar multisequence MR imaging of the abdomen was performed, including heavily T2-weighted images of the  biliary and pancreatic ducts. Three-dimensional MR images were rendered by post processing of the original MR data.  CONTRAST:  40mL MULTIHANCE GADOBENATE DIMEGLUMINE 529 MG/ML IV SOLN  COMPARISON:  CT of the abdomen and pelvis 09/24/2013. Abdominal ultrasound 09/24/2013.  FINDINGS: Out of phase dual echo images demonstrate diffuse heterogeneous loss of signal intensity throughout the hepatic parenchyma, compatible with hepatic steatosis, most severe throughout the right lobe of the liver. No focal hepatic lesions are noted. MRCP images demonstrate no intra or extrahepatic biliary ductal dilatation. No pancreatic ductal dilatation. No definite filling defects within the gallbladder are noted to suggest presence of gallstones or a significant volume of biliary sludge. Common bile duct is normal in caliber and appearance measuring 3 mm in the porta hepatis.  The appearance of the pancreas, spleen, bilateral adrenal glands and bilateral kidneys is unremarkable. Within the visualized portions of the peritoneal cavity there is no significant volume of ascites. No suspicious lymphadenopathy.  IMPRESSION: 1. No evidence of gallstones. No evidence of biliary sludge. No findings to suggest biliary tract obstruction. 2. Severe hepatic steatosis, as above.   Electronically Signed   By: Vinnie Langton M.D.   On: 09/27/2013 12:28   Mr Abd W/wo Cm/mrcp  09/27/2013   CLINICAL DATA:  Nausea, vomiting and diarrhea over 1 month. Recent history of pancreatitis. Evaluate for potential gallstones versus biliary sludge.  EXAM: MR 3D RECON AT SCANNER; MR ABDOMEN WO/W CM MRCP  TECHNIQUE: Multiplanar multisequence MR imaging of the abdomen was performed, including heavily T2-weighted images of the biliary and pancreatic ducts. Three-dimensional MR images were rendered by post processing of the original MR data.  CONTRAST:  68mL MULTIHANCE GADOBENATE DIMEGLUMINE 529 MG/ML IV SOLN  COMPARISON:  CT of the abdomen and pelvis 09/24/2013.  Abdominal ultrasound 09/24/2013.  FINDINGS: Out of phase dual echo images demonstrate diffuse heterogeneous loss of signal intensity throughout the hepatic parenchyma, compatible with hepatic steatosis, most severe throughout the right lobe of the liver. No focal hepatic lesions are noted. MRCP images demonstrate no intra or extrahepatic biliary ductal dilatation. No pancreatic ductal dilatation. No definite filling defects within the gallbladder are noted to suggest presence of gallstones or a significant volume of biliary sludge. Common bile duct is normal in caliber and appearance measuring 3 mm in the porta hepatis.  The appearance of the pancreas, spleen, bilateral adrenal glands and bilateral kidneys is unremarkable. Within the visualized portions of the peritoneal cavity there is no significant volume of ascites. No suspicious lymphadenopathy.  IMPRESSION: 1. No evidence of gallstones. No evidence of biliary sludge. No findings to suggest biliary tract obstruction. 2. Severe hepatic steatosis, as above.   Electronically Signed   By: Vinnie Langton M.D.   On: 09/27/2013 12:28    Medications:  Scheduled: . heparin  5,000 Units Subcutaneous 3 times per day  . ondansetron (ZOFRAN) IV  4 mg Intravenous 4 times per day  . pantoprazole  40 mg Oral Daily  . sodium chloride  3 mL Intravenous Q12H   Continuous: . sodium chloride 125 mL/hr at 09/27/13 1608    Assessment/Plan: 1) ? Pancreatitis. 2) Abnormal liver enzymes - improving.  ? Etiology 3) Severe hepatic steatosis.   Clinically he is improving, but very slowly.  The MRCP was negative for any stones or sludge.  I do not know why his transaminases acutely increased and now are declining.  Plan: 1) Pain control. 2) Advance diet as tolerated.   LOS: 3 days   Macall Mccroskey D 09/27/2013, 6:02 PM

## 2013-09-28 LAB — COMPREHENSIVE METABOLIC PANEL
ALBUMIN: 3.4 g/dL — AB (ref 3.5–5.2)
ALK PHOS: 76 U/L (ref 39–117)
ALT: 123 U/L — ABNORMAL HIGH (ref 0–53)
AST: 96 U/L — AB (ref 0–37)
BILIRUBIN TOTAL: 0.9 mg/dL (ref 0.3–1.2)
CHLORIDE: 105 meq/L (ref 96–112)
CO2: 20 mEq/L (ref 19–32)
Calcium: 8.8 mg/dL (ref 8.4–10.5)
Creatinine, Ser: 0.81 mg/dL (ref 0.50–1.35)
GFR calc Af Amer: >90 mL/min (ref >90)
Glucose, Bld: 112 mg/dL — ABNORMAL HIGH (ref 70–99)
POTASSIUM: 3.5 meq/L — AB (ref 3.7–5.3)
Sodium: 141 mEq/L (ref 137–147)
Total Protein: 6.5 g/dL (ref 6.0–8.3)

## 2013-09-28 MED ORDER — MORPHINE SULFATE 2 MG/ML IJ SOLN
1.0000 mg | Freq: Once | INTRAMUSCULAR | Status: AC
  Administered 2013-09-28: 1 mg via INTRAVENOUS
  Filled 2013-09-28: qty 1

## 2013-09-28 MED ORDER — SUCRALFATE 1 GM/10ML PO SUSP
1.0000 g | Freq: Three times a day (TID) | ORAL | Status: DC
  Administered 2013-09-28 – 2013-10-02 (×15): 1 g via ORAL
  Filled 2013-09-28 (×21): qty 10

## 2013-09-28 MED ORDER — PANTOPRAZOLE SODIUM 40 MG PO TBEC
40.0000 mg | DELAYED_RELEASE_TABLET | Freq: Two times a day (BID) | ORAL | Status: DC
  Administered 2013-09-28 – 2013-10-02 (×9): 40 mg via ORAL
  Filled 2013-09-28 (×11): qty 1

## 2013-09-28 MED ORDER — ACETAMINOPHEN 325 MG PO TABS
650.0000 mg | ORAL_TABLET | Freq: Four times a day (QID) | ORAL | Status: DC | PRN
  Administered 2013-09-30: 650 mg via ORAL
  Filled 2013-09-28: qty 2

## 2013-09-28 NOTE — Progress Notes (Signed)
[facility] PROGRESS NOTE  Richard House [id number] DOB: Jul 19, 1986 DOA: 09/24/2013 PCP: [name], MD  Assessment/Plan: 1. Suspected pancreatitis. Patient was seen and evaluated by gastroenterology, recommending IV hydration, pain control, bowel rest. Although lipase was negative, his clinical presentation seems most consistent with pancreatitis. MRCP did not show evidence of gallstones, biliary sludge or biliary tract obstruction. Sed rate within normal range at 15. He continues to complain of severe abdominal pain although patient complaints may be out of proportion to physical exam findings.  2. Elevated transaminases. Patient having fatty liver based on ultrasound. Liver enzymes are trending down. Urine drug screen showed only the presence of opiates. 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 Abdominal Pain. Patient is nontoxic, now tolerating PO intake, having a normal sed rate. Work up has been unremarkable. Will start carafate and increase his protonix to 40mg  PO BID 4. Nausea vomiting. Could be secondary to pancreatitis, improved.  5. Gastroesophageal reflux disease. Continue PPI therapy  Code Status: Full code Disposition Plan: Advancing diet to regular diet, continues to complain of abdominal pain, however is able to walk down hallway, tolerate PO. Will monitor him on regular diet, I anticipate discharge in the next 24 hours if he remains stable.    Consultants:  Gastroenterology   Antibiotics:  None  HPI/Subjective: Patient is a pleasant 28 year old gentleman with a past medical history drug abuse, alcohol abuse, admitted to the medicine service on 09/24/2013, presenting with complaints of nausea, vomiting, epigastric pain. 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 This morning patient denied nausea vomiting, requesting for study events. He does complain of ongoing abdominal pain stating  that IV dilaudid worked better than IV morphine. During my encounter he did not appear to be in severe distress, as I found him resting comfortably.   Patient complains of ongoing severe abdominal pain, although is tolerating PO, ambulating down the hall way. Remains afebrile, nontoxic. No further episodes of N/V.   Objective: Filed Vitals:   09/28/13 0415  BP: 115/64  Pulse: 83  Temp:   Resp: 20    Intake/Output Summary (Last 24 hours) at 09/28/13 0936 Last data filed at 09/28/13 0600  Gross per 24 hour  Intake   3480 ml  Output      0 ml  Net   3480 ml   Filed Weights   09/24/13 1810  Weight: 104.146 kg (229 lb 9.6 oz)    Exam:   General:  Patient does not appear to be in acute distress, found resting comfortably in bed.  Cardiovascular:  Regular rate and rhythm normal S1-S2 no murmurs or gallop  Respiratory: Clear to auscultation bilaterally, normal respiratory effort  Abdomen: He has generalized abdominal pain, worse in the epigastric region, no rebound tenderness or guarding  Musculoskeletal: No edema  Data Reviewed: Basic Metabolic Panel:  Recent Labs Lab 09/24/13 1124 09/25/13 0455 09/27/13 0535  NA 139 138 141  K 3.5* 3.4* 3.5*  CL 97 100 108  CO2 19 24 21   GLUCOSE 146* 110* 128*  BUN 5* 6 5*  CREATININE 1.05 1.06 0.88  CALCIUM 9.2 8.1* 7.9*   Liver Function Tests:  Recent Labs Lab 09/24/13 1124 09/25/13 0455 09/26/13 1300 09/27/13 0535  AST 223* 115* 175* 131*  ALT 211* 146* 147* 127*  ALKPHOS 118* 92 92 83  BILITOT 1.9* 1.4* 1.0 0.6  PROT 8.0 6.3 7.1 5.9*  ALBUMIN 4.1 3.3* 3.6 3.1*  Recent Labs Lab 09/24/13 1124 October 24, 2013 0535  LIPASE 41 74*   No results found for this basename: AMMONIA,  in the last 168 hours CBC:  Recent Labs Lab 09/24/13 1124 09/25/13 0455 2013/10/24 0535  WBC 13.1* 7.6 7.4  NEUTROABS 11.0*  --   --   HGB 17.8* 14.8 14.0  HCT 48.4 41.5 39.7  MCV 95.1 97.4 98.3  PLT 309 237 211   Cardiac  Enzymes: No results found for this basename: CKTOTAL, CKMB, CKMBINDEX, TROPONINI,  in the last 168 hours BNP (last 3 results) No results found for this basename: PROBNP,  in the last 8760 hours CBG: No results found for this basename: GLUCAP,  in the last 168 hours  Recent Results (from the past 240 hour(s))  URINE CULTURE     Status: None   Collection Time    09/24/13 11:27 AM      Result Value Ref Range Status   Specimen Description URINE, CLEAN CATCH   Final   Special Requests NONE   Final   Culture  Setup Time     Final   Value: 09/24/2013 14:59     Performed at [place]     Final   Value: 60,000 COLONIES/ML     Performed at [facility]   Culture     Final   Value: LACTOBACILLUS SPECIES     Note: Standardized susceptibility testing for this organism is not available.     Performed at [facility]   Report Status 09/25/2013 FINAL   Final     Studies: Mr 3d Recon At Scanner  October 24, 2013   CLINICAL DATA:  Nausea, vomiting and diarrhea over 1 month. Recent history of pancreatitis. Evaluate for potential gallstones versus biliary sludge.  EXAM: MR 3D RECON AT SCANNER; MR ABDOMEN WO/W CM MRCP  TECHNIQUE: Multiplanar multisequence MR imaging of the abdomen was performed, including heavily T2-weighted images of the biliary and pancreatic ducts. Three-dimensional MR images were rendered by post processing of the original MR data.  CONTRAST:  75mL MULTIHANCE GADOBENATE DIMEGLUMINE 529 MG/ML IV SOLN  COMPARISON:  CT of the abdomen and pelvis 09/24/2013. Abdominal ultrasound 09/24/2013.  FINDINGS: Out of phase dual echo images demonstrate diffuse heterogeneous loss of signal intensity throughout the hepatic parenchyma, compatible with hepatic steatosis, most severe throughout the right lobe of the liver. No focal hepatic lesions are noted. MRCP images demonstrate no intra or extrahepatic biliary ductal dilatation. No pancreatic ductal dilatation. No  definite filling defects within the gallbladder are noted to suggest presence of gallstones or a significant volume of biliary sludge. Common bile duct is normal in caliber and appearance measuring 3 mm in the porta hepatis.  The appearance of the pancreas, spleen, bilateral adrenal glands and bilateral kidneys is unremarkable. Within the visualized portions of the peritoneal cavity there is no significant volume of ascites. No suspicious lymphadenopathy.  IMPRESSION: 1. No evidence of gallstones. No evidence of biliary sludge. No findings to suggest biliary tract obstruction. 2. Severe hepatic steatosis, as above.   Electronically Signed   By: Vinnie Langton M.D.   On: 2013/10/24 12:28   Mr Abd W/wo Cm/mrcp  10-24-13   CLINICAL DATA:  Nausea, vomiting and diarrhea over 1 month. Recent history of pancreatitis. Evaluate for potential gallstones versus biliary sludge.  EXAM: MR 3D RECON AT SCANNER; MR ABDOMEN WO/W CM MRCP  TECHNIQUE: Multiplanar multisequence MR imaging of the abdomen was performed, including heavily T2-weighted images of the  biliary and pancreatic ducts. Three-dimensional MR images were rendered by post processing of the original MR data.  CONTRAST:  56mL MULTIHANCE GADOBENATE DIMEGLUMINE 529 MG/ML IV SOLN  COMPARISON:  CT of the abdomen and pelvis 09/24/2013. Abdominal ultrasound 09/24/2013.  FINDINGS: Out of phase dual echo images demonstrate diffuse heterogeneous loss of signal intensity throughout the hepatic parenchyma, compatible with hepatic steatosis, most severe throughout the right lobe of the liver. No focal hepatic lesions are noted. MRCP images demonstrate no intra or extrahepatic biliary ductal dilatation. No pancreatic ductal dilatation. No definite filling defects within the gallbladder are noted to suggest presence of gallstones or a significant volume of biliary sludge. Common bile duct is normal in caliber and appearance measuring 3 mm in the porta hepatis.  The appearance  of the pancreas, spleen, bilateral adrenal glands and bilateral kidneys is unremarkable. Within the visualized portions of the peritoneal cavity there is no significant volume of ascites. No suspicious lymphadenopathy.  IMPRESSION: 1. No evidence of gallstones. No evidence of biliary sludge. No findings to suggest biliary tract obstruction. 2. Severe hepatic steatosis, as above.   Electronically Signed   By: Vinnie Langton M.D.   On: 09/27/2013 12:28    Scheduled Meds: . heparin  5,000 Units Subcutaneous 3 times per day  .  morphine injection  1 mg Intravenous Once  . ondansetron (ZOFRAN) IV  4 mg Intravenous 4 times per day  . pantoprazole  40 mg Oral BID  . sodium chloride  3 mL Intravenous Q12H  . sucralfate  1 g Oral TID WC & HS   Continuous Infusions:    Principal Problem:   Pancreatitis, acute, cryptogenic Active Problems:   Essential hypertension, benign   Benign paroxysmal positional vertigo   Nausea with vomiting   Hepatic steatosis   Hypokalemia   UTI (lower urinary tract infection)    Time spent: 35 minutes    Kelvin Cellar  [facility] Pager [phone]. If 7PM-7AM, please contact night-coverage at [email or web address], password [facility] 09/28/2013, 9:36 AM  LOS: 4 days

## 2013-09-29 ENCOUNTER — Inpatient Hospital Stay (HOSPITAL_COMMUNITY): Payer: No Typology Code available for payment source

## 2013-09-29 DIAGNOSIS — R509 Fever, unspecified: Secondary | ICD-10-CM

## 2013-09-29 MED ORDER — MORPHINE SULFATE 2 MG/ML IJ SOLN
1.0000 mg | Freq: Once | INTRAMUSCULAR | Status: AC
  Administered 2013-09-29: 1 mg via INTRAVENOUS
  Filled 2013-09-29: qty 1

## 2013-09-29 NOTE — Progress Notes (Signed)
[facility] PROGRESS NOTE  Richard House WCB:[id number] DOB: 09/16/1985 DOA: 09/24/2013 PCP: [name], MD  Assessment/Plan: 1. Suspected pancreatitis. Patient was seen and evaluated by gastroenterology, recommending IV hydration, pain control, bowel rest. Although lipase was negative, his clinical presentation seems most consistent with pancreatitis. MRCP did not show evidence of gallstones, biliary sludge or biliary tract obstruction. Sed rate within normal range at 15. He continues to complain of severe abdominal pain although patient complaints may be out of proportion to physical exam findings.  2. Elevated transaminases. Patient having fatty liver based on ultrasound. Liver enzymes are trending down. Urine drug screen showed only the presence of opiates. 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 Abdominal Pain. Patient is nontoxic, now tolerating PO intake, having a normal sed rate. Work up has been unremarkable. Will start carafate and increase his protonix to 40mg  PO BID 4. Suspected Viral Syndrome. Patient yesterday afternoon reporting chills, having a low grade temperature. He reported vomiting this morning although this was not witness by nursing staff. Will check CXR.  5. Nausea vomiting. He reports having episode of N/V that was not witnessed by RN. Will monitor, provide as needed antiemetic therapy. 6. Gastroesophageal reflux disease. Continue PPI therapy  Code Status: Full code Disposition Plan: Advancing diet to regular diet, continues to complain of abdominal pain, however is able to walk down hallway, tolerate PO. Will monitor him on regular diet, I anticipate discharge in the next 24 hours if he remains stable.    Consultants:  Gastroenterology   Antibiotics:  None  HPI/Subjective: Patient is a pleasant 28 year old gentleman with a past medical history drug abuse, alcohol abuse, admitted to the medicine service on 09/24/2013,  presenting with complaints of nausea, vomiting, epigastric pain. 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 This morning patient denied nausea vomiting, requesting for study events. He does complain of ongoing abdominal pain stating that IV dilaudid worked better than IV morphine. During my encounter he did not appear to be in severe distress, as I found him resting comfortably.   Patient complains of ongoing severe abdominal pain, although is tolerating PO, ambulating down the hall way. Remains afebrile, nontoxic. No further episodes of N/V.   Objective: Filed Vitals:   09/29/13 0457  BP: 123/63  Pulse: 85  Temp: 98.1 F (36.7 C)  Resp: 18    Intake/Output Summary (Last 24 hours) at 09/29/13 1229 Last data filed at 09/28/13 1600  Gross per 24 hour  Intake    720 ml  Output      0 ml  Net    720 ml   Filed Weights   09/24/13 1810  Weight: 104.146 kg (229 lb 9.6 oz)    Exam:   General:  Patient does not appear to be in acute distress, found resting comfortably in bed.  Cardiovascular:  Regular rate and rhythm normal S1-S2 no murmurs or gallop  Respiratory: Clear to auscultation bilaterally, normal respiratory effort  Abdomen: He has generalized abdominal pain, worse in the epigastric region, no rebound tenderness or guarding  Musculoskeletal: No edema  Data Reviewed: Basic Metabolic Panel:  Recent Labs Lab 09/24/13 1124 09/25/13 0455 09/27/13 0535 09/28/13 0930  NA 139 138 141 141  K 3.5* 3.4* 3.5* 3.5*  CL 97 100 108 105  CO2 19 24 21 20   GLUCOSE 146* 110* 128* 112*  BUN 5* 6 5* <3*  CREATININE 1.05 1.06 0.88 0.81  CALCIUM  9.2 8.1* 7.9* 8.8   Liver Function Tests:  Recent Labs Lab 09/24/13 1124 09/25/13 0455 09/26/13 1300 09/27/13 0535 09/28/13 0930  AST 223* 115* 175* 131* 96*  ALT 211* 146* 147* 127* 123*  ALKPHOS 118* 92 92 83 76  BILITOT 1.9* 1.4* 1.0 0.6 0.9  PROT 8.0 6.3 7.1 5.9* 6.5   ALBUMIN 4.1 3.3* 3.6 3.1* 3.4*    Recent Labs Lab 09/24/13 1124 09/27/13 0535  LIPASE 41 74*   No results found for this basename: AMMONIA,  in the last 168 hours CBC:  Recent Labs Lab 09/24/13 1124 09/25/13 0455 09/27/13 0535  WBC 13.1* 7.6 7.4  NEUTROABS 11.0*  --   --   HGB 17.8* 14.8 14.0  HCT 48.4 41.5 39.7  MCV 95.1 97.4 98.3  PLT 309 237 211   Cardiac Enzymes: No results found for this basename: CKTOTAL, CKMB, CKMBINDEX, TROPONINI,  in the last 168 hours BNP (last 3 results) No results found for this basename: PROBNP,  in the last 8760 hours CBG: No results found for this basename: GLUCAP,  in the last 168 hours  Recent Results (from the past 240 hour(s))  URINE CULTURE     Status: None   Collection Time    09/24/13 11:27 AM      Result Value Ref Range Status   Specimen Description URINE, CLEAN CATCH   Final   Special Requests NONE   Final   Culture  Setup Time     Final   Value: 09/24/2013 14:59     Performed at [place]     Final   Value: 60,000 COLONIES/ML     Performed at [facility]   Culture     Final   Value: LACTOBACILLUS SPECIES     Note: Standardized susceptibility testing for this organism is not available.     Performed at [facility]   Report Status 09/25/2013 FINAL   Final     Studies: No results found.  Scheduled Meds: . heparin  5,000 Units Subcutaneous 3 times per day  .  morphine injection  1 mg Intravenous Once  . ondansetron (ZOFRAN) IV  4 mg Intravenous 4 times per day  . pantoprazole  40 mg Oral BID  . sodium chloride  3 mL Intravenous Q12H  . sucralfate  1 g Oral TID WC & HS   Continuous Infusions:    Principal Problem:   Pancreatitis, acute, cryptogenic Active Problems:   Essential hypertension, benign   Benign paroxysmal positional vertigo   Nausea with vomiting   Hepatic steatosis   Hypokalemia   UTI (lower urinary tract infection)    Time spent: 35  minutes    Kelvin Cellar  [facility] Pager [phone]. If 7PM-7AM, please contact night-coverage at [email or web address], password [facility] 09/29/2013, 12:29 PM  LOS: 5 days

## 2013-09-30 DIAGNOSIS — E876 Hypokalemia: Secondary | ICD-10-CM

## 2013-09-30 DIAGNOSIS — R7989 Other specified abnormal findings of blood chemistry: Secondary | ICD-10-CM | POA: Diagnosis present

## 2013-09-30 DIAGNOSIS — R945 Abnormal results of liver function studies: Secondary | ICD-10-CM

## 2013-09-30 DIAGNOSIS — E860 Dehydration: Secondary | ICD-10-CM | POA: Diagnosis present

## 2013-09-30 LAB — CBC
HEMATOCRIT: 39.3 % (ref 39.0–52.0)
HEMOGLOBIN: 13.8 g/dL (ref 13.0–17.0)
MCH: 34.3 pg — AB (ref 26.0–34.0)
MCHC: 35.1 g/dL (ref 30.0–36.0)
MCV: 97.8 fL (ref 78.0–100.0)
Platelets: 152 10*3/uL (ref 150–400)
RBC: 4.02 MIL/uL — ABNORMAL LOW (ref 4.22–5.81)
RDW: 14.3 % (ref 11.5–15.5)
WBC: 6 10*3/uL (ref 4.0–10.5)

## 2013-09-30 LAB — COMPREHENSIVE METABOLIC PANEL
ALBUMIN: 3.1 g/dL — AB (ref 3.5–5.2)
ALT: 95 U/L — ABNORMAL HIGH (ref 0–53)
AST: 70 U/L — ABNORMAL HIGH (ref 0–37)
Alkaline Phosphatase: 59 U/L (ref 39–117)
BUN: 7 mg/dL (ref 6–23)
CALCIUM: 8.4 mg/dL (ref 8.4–10.5)
CO2: 24 mEq/L (ref 19–32)
CREATININE: 0.84 mg/dL (ref 0.50–1.35)
Chloride: 99 mEq/L (ref 96–112)
GFR calc Af Amer: >90 mL/min (ref >90)
GFR calc non Af Amer: >90 mL/min (ref >90)
GLUCOSE: 126 mg/dL — AB (ref 70–99)
Potassium: 3.1 mEq/L — ABNORMAL LOW (ref 3.7–5.3)
Sodium: 138 mEq/L (ref 137–147)
TOTAL PROTEIN: 6 g/dL (ref 6.0–8.3)
Total Bilirubin: 0.7 mg/dL (ref 0.3–1.2)

## 2013-09-30 LAB — SEDIMENTATION RATE: SED RATE: 23 mm/h — AB (ref 0–16)

## 2013-09-30 LAB — LIPASE, BLOOD: LIPASE: 64 U/L — AB (ref 11–59)

## 2013-09-30 MED ORDER — POTASSIUM CHLORIDE CRYS ER 20 MEQ PO TBCR
40.0000 meq | EXTENDED_RELEASE_TABLET | Freq: Four times a day (QID) | ORAL | Status: AC
  Administered 2013-09-30 (×2): 40 meq via ORAL
  Filled 2013-09-30 (×2): qty 2

## 2013-09-30 MED ORDER — PANTOPRAZOLE SODIUM 40 MG PO TBEC: 40.0000 mg | DELAYED_RELEASE_TABLET | Freq: Two times a day (BID) | ORAL | Status: DC

## 2013-09-30 MED ORDER — SUCRALFATE 1 G PO TABS: 1.0000 g | ORAL_TABLET | Freq: Three times a day (TID) | ORAL | Status: DC

## 2013-09-30 MED ORDER — OXYCODONE HCL 10 MG PO TABS: 10.0000 mg | ORAL_TABLET | ORAL | Status: DC | PRN

## 2013-09-30 MED ORDER — OXYCODONE HCL 5 MG PO TABS
5.0000 mg | ORAL_TABLET | ORAL | Status: DC | PRN
  Administered 2013-09-30 – 2013-10-02 (×10): 5 mg via ORAL
  Filled 2013-09-30 (×10): qty 1

## 2013-09-30 NOTE — Progress Notes (Signed)
[facility] PROGRESS NOTE  Richard House PNT:[id number] DOB: 1985/09/22 DOA: 09/24/2013 PCP: [name], MD  Assessment/Plan: 1. Suspected pancreatitis. Patient was seen and evaluated by gastroenterology, recommending IV hydration, pain control, bowel rest. Although lipase was negative, his clinical presentation seems most consistent with pancreatitis. MRCP did not show evidence of gallstones, biliary sludge or biliary tract obstruction. Sed rate within normal range at 15. He continues to complain of severe abdominal pain although patient complaints may be out of proportion to physical exam findings.  2. Nausea/Vomiting. Unclear etiology. He states having several episodes of N/V today which actually haven't been witnessed by RN. Repeat Lipase level normal. Would avoid all IV narcotic agents and scale back on his oxycodone does as this may be the cause of his nausea. Await further input from GI. 3. Elevated transaminases. Patient having fatty liver based on ultrasound. Liver enzymes are trending down.  4. Abdominal Pain. Patient is nontoxic, now tolerating PO intake, having a normal sed rate. Work up has been unremarkable. Will start carafate and increase his protonix to 40mg  PO BID 5. Gastroesophageal reflux disease. Continue PPI therapy  Code Status: Full code Disposition Plan: I originally planned to discharge him today, however with his complaints of N/V will keep him overnight, decrease narcotics.    Consultants:  Gastroenterology   Antibiotics:  None  HPI/Subjective: Patient is a pleasant 28 year old gentleman with a past medical history drug abuse, alcohol abuse, admitted to the medicine service on 09/24/2013, presenting with complaints of nausea, vomiting, epigastric pain. 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 This morning patient denied nausea vomiting, requesting for study events. He does complain of ongoing  abdominal pain stating that IV dilaudid worked better than IV morphine. During my encounter he did not appear to be in severe distress, as I found him resting comfortably.   Patient complains of ongoing severe abdominal pain, although is tolerating PO, ambulating down the hall way. Remains afebrile, nontoxic. No further episodes of N/V.   Objective: Filed Vitals:   09/30/13 1315  BP: 119/66  Pulse: 69  Temp: 98.1 F (36.7 C)  Resp: 20    Intake/Output Summary (Last 24 hours) at 09/30/13 1603 Last data filed at 09/30/13 1300  Gross per 24 hour  Intake      0 ml  Output      0 ml  Net      0 ml   Filed Weights   09/24/13 1810  Weight: 104.146 kg (229 lb 9.6 oz)    Exam:   General:  Patient does not appear to be in acute distress, found resting comfortably in bed.  Cardiovascular:  Regular rate and rhythm normal S1-S2 no murmurs or gallop  Respiratory: Clear to auscultation bilaterally, normal respiratory effort  Abdomen: He has generalized abdominal pain, worse in the epigastric region, no rebound tenderness or guarding  Musculoskeletal: No edema  Data Reviewed: Basic Metabolic Panel:  Recent Labs Lab 09/24/13 1124 09/25/13 0455 09/27/13 0535 09/28/13 0930 09/30/13 1109  NA 139 138 141 141 138  K 3.5* 3.4* 3.5* 3.5* 3.1*  CL 97 100 108 105 99  CO2 19 24 21 20 24   GLUCOSE 146* 110* 128* 112* 126*  BUN 5* 6 5* <3* 7  CREATININE 1.05 1.06 0.88 0.81 0.84  CALCIUM 9.2 8.1* 7.9* 8.8 8.4   Liver Function Tests:  Recent Labs Lab 09/25/13 0455 09/26/13 1300 09/27/13 0535 09/28/13 0930 09/30/13 1109  AST 115* 175*  131* 96* 70*  ALT 146* 147* 127* 123* 95*  ALKPHOS 92 92 83 76 59  BILITOT 1.4* 1.0 0.6 0.9 0.7  PROT 6.3 7.1 5.9* 6.5 6.0  ALBUMIN 3.3* 3.6 3.1* 3.4* 3.1*    Recent Labs Lab 09/24/13 1124 09/27/13 0535 09/30/13 1109  LIPASE 41 74* 64*   No results found for this basename: AMMONIA,  in the last 168 hours CBC:  Recent Labs Lab  09/24/13 1124 09/25/13 0455 09/27/13 0535 09/30/13 0527  WBC 13.1* 7.6 7.4 6.0  NEUTROABS 11.0*  --   --   --   HGB 17.8* 14.8 14.0 13.8  HCT 48.4 41.5 39.7 39.3  MCV 95.1 97.4 98.3 97.8  PLT 309 237 211 152   Cardiac Enzymes: No results found for this basename: CKTOTAL, CKMB, CKMBINDEX, TROPONINI,  in the last 168 hours BNP (last 3 results) No results found for this basename: PROBNP,  in the last 8760 hours CBG: No results found for this basename: GLUCAP,  in the last 168 hours  Recent Results (from the past 240 hour(s))  URINE CULTURE     Status: None   Collection Time    09/24/13 11:27 AM      Result Value Ref Range Status   Specimen Description URINE, CLEAN CATCH   Final   Special Requests NONE   Final   Culture  Setup Time     Final   Value: 09/24/2013 14:59     Performed at [facility]     Final   Value: 60,000 COLONIES/ML     Performed at [facility]   Culture     Final   Value: LACTOBACILLUS SPECIES     Note: Standardized susceptibility testing for this organism is not available.     Performed at [facility]   Report Status 09/25/2013 FINAL   Final     Studies: Dg Chest 2 View  09/29/2013   CLINICAL DATA:  Chills and fever  EXAM: CHEST  2 VIEW  COMPARISON:  DG CHEST 1V PORT dated 08/12/2013  FINDINGS: The heart size and mediastinal contours are within normal limits. Both lungs are clear. The visualized skeletal structures are unremarkable.  IMPRESSION: No active cardiopulmonary disease.   Electronically Signed   By: Conchita Paris M.D.   On: 09/29/2013 16:30    Scheduled Meds: . ondansetron (ZOFRAN) IV  4 mg Intravenous 4 times per day  . pantoprazole  40 mg Oral BID  . potassium chloride  40 mEq Oral Q6H  . sodium chloride  3 mL Intravenous Q12H  . sucralfate  1 g Oral TID WC & HS   Continuous Infusions:    Principal Problem:   Pancreatitis, acute, cryptogenic Active Problems:   Nausea with vomiting    Elevated LFTs   Essential hypertension, benign   Hepatic steatosis   Hypokalemia   Dehydration    Time spent: 35 minutes    Kelvin Cellar  [facility] Pager [phone]. If 7PM-7AM, please contact night-coverage at [email or web address], password [facility] 09/30/2013, 4:03 PM  LOS: 6 days

## 2013-09-30 NOTE — Progress Notes (Signed)
Vomited mod amt of light yellow liquid after eating 1/2 of breakfast.

## 2013-09-30 NOTE — Discharge Summary (Addendum)
Physician Discharge Summary  Richard House [id number] DOB: March 20, 1986 DOA: 09/24/2013  PCP: [name], MD  Admit date: 09/24/2013 Discharge date: 09/30/2013  Time spent: 35 minutes  Recommendations for Outpatient Follow-up:  1. Follow up on CMP and CBC in 1 week.   Discharge Diagnoses:  Principal Problem:   Pancreatitis, acute, cryptogenic Active Problems:   Nausea with vomiting   Elevated LFTs   Essential hypertension, benign   Hepatic steatosis   Hypokalemia   Dehydration   Discharge Condition: Stable  Diet recommendation: Heart Healthy  Filed Weights   09/24/13 1810  Weight: 104.146 kg (229 lb 9.6 oz)    History of present illness:  This is a 28 year old male with a recent history of ETOH pancreatitis in 08/12/13. He was suffering from nausea and vomiting for the four months prior secondary to vertigo, but he acutely started to have epigastric pain. The pain brought him to the hospital and further evaluation revealed an elevation in his lipase, liver enzymes, and mild pancreatitis on CT scan. 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 Upon his discharge in January he still had issues with his nausea and vomiting. He was readmitted as he was not able to tolerate any PO for the past 4-5 days. He states that he stopped drinking ETOH since his last visit. The CT scan during this admission does not reveal any evidence of pancreatitis and his lipase is normal. Additionally, his lipid panel does show an improvement.   Hospital Course:  Patient is a 28 year old gentleman with a past medical history of alcohol abuse, polysubstance abuse, pancreatitis, who was discharged from the medicine service recently on 08/20/2013, admitted on 08/12/2013 at which time he was treated for nausea vomiting, epigastric pain in setting of acute pancreatitis. During that hospitalization he had elevated liver enzymes which were felt to be secondary to alcohol abuse. Nausea vomiting and  diarrhea in part were attributed to narcotic withdrawal. He presented on 09/24/2013 with complaints of nausea vomiting and epigastric pain. He stated unable to tolerate by mouth in the 3-5 days prior to presentation. He denied recent alcohol intake. Patient was admitted to the medicine service with Dr. Benson Norway of gastroenterology consulting. Initial lab work showed normal lipase level of 41. Liver enzymes were elevated with an AST of 223 and ALT of 211. AST to ALT ratio would seem to be consistent with alcoholic hepatitis, although patient had denied recent alcohol intake. CT scan of abdomen and pelvis performed on 09/24/2013 showed no acute findings with no evidence of pancreatitis. There was noted mild hepatomegaly and extensive hepatic steatosis unchanged from previous study. Patient was further worked up with an MRCP which does not reveal evidence of gallstones, biliary sludge, or any other findings to suggest biliary tract obstruction. During this hospitalization patient complained of ongoing epigastric pain. At times it appeared that patient complaints were out of proportion to physical exam findings. He was ambulating down the hallway in no acute distress. During my encounters with him he was calm, and do not have clinical evidence of 10 out of 10 pain. Patient had reported nausea vomiting during this hospitalization however this was not witnessed by nursing staff. On 09/26/2013 a sedimentation rate was checked which came back within normal limits at 15. His lab work also remained stable, with white count remaining within normal limits for greater than 72 hours. IV fluids have been discontinued. I  spent an extensive amount of time with patient explaining that lab work, possible  etiologies for abdominal opain and the need for continual followup with GI in the outpatient setting. Patient wishing to stay longer in the hospital, although I feel that he likely has reached the point of maximal hospitalization  benefit. Up to this pint he no longer required IV therapies. Shortly after I left the room he reported to RN that he had just had an episode of N/V that was unwitnessed. During my earlier encounter, I did not note dry heaves or any evidence that he was about to become sick.   Addendum Patient was seen and examined on 10/02/2013. He seems to be doing better, with improvement of nausea and vomiting. Yesterday afternoon I spoke with his gastroenterologist Dr. Benson Norway regarding complaints of nausea vomiting. He evaluated patient and felt he did not require further inpatient management, recommending discharge home. Unclear cause for elevated liver enzymes however they have trended down according to lab work this morning. Patient has been scheduled a followup appointment with local PCP. We'll plan to discharge him this morning.  Consultations:  Gastroenterology  Discharge Exam: Filed Vitals:   09/30/13 0501  BP: 128/74  Pulse: 68  Temp: 98.2 F (36.8 C)  Resp: 20    General: He is in no acute distress, ambulating down the hallway this morning, overall appears well without evidence of 10 out of 10 pain Cardiovascular: Regular rate rhythm Respiratory: Clear to auscultation bilaterally Patient reporting epigastric pain with palpation  Discharge Instructions  Discharge Orders   Future Appointments Provider Department Dept Phone   10/04/2013 9:30 AM [name], MD [facility] [phone]   10/08/2013 5:15 PM [name], MD [place] [facility] [phone]   Future Orders Complete By Expires   Call MD for:  difficulty breathing, headache or visual disturbances  As directed    Call MD for:  extreme fatigue  As directed    Call MD for:  persistant dizziness or light-headedness  As directed    Call MD for:  persistant nausea and vomiting  As directed    Call MD for:  temperature >100.4  As directed    Diet - low sodium heart healthy  As directed     Increase activity slowly  As directed        Medication List    STOP taking these medications       bismuth subsalicylate 544 BE/01EO suspension  Commonly known as:  PEPTO BISMOL     traMADol 50 MG tablet  Commonly known as:  ULTRAM      TAKE these medications       loperamide 1 MG/5ML solution  Commonly known as:  IMODIUM  Take 2 mg by mouth daily as needed for diarrhea or loose stools.     metoCLOPramide 10 MG tablet  Commonly known as:  REGLAN  Take 1 tablet (10 mg total) by mouth every 6 (six) hours as needed for nausea or vomiting.     Oxycodone HCl 10 MG Tabs  Take 1 tablet (10 mg total) by mouth every 4 (four) hours as needed for severe pain.     pantoprazole 40 MG tablet  Commonly known as:  PROTONIX  Take 1 tablet (40 mg total) by mouth 2 (two) times daily.     promethazine 25 MG suppository  Commonly known as:  PHENERGAN  Place 1 suppository (25 mg total) rectally every 6 (six) hours as needed for nausea or vomiting.     sucralfate 1 G tablet  Commonly known  as:  CARAFATE  Take 1 tablet (1 g total) by mouth 4 (four) times daily -  with meals and at bedtime.       No Known Allergies     Follow-up Information   Follow up with [facility]     On 10/08/2013. (@5 :15p/Dr. Jegede/Bring photo id/$20visit)    Contact information:   [place]  [phone]       The results of significant diagnostics from this hospitalization (including imaging, microbiology, ancillary and laboratory) are listed below for reference.    Significant Diagnostic Studies: Dg Chest 2 View  09/29/2013   CLINICAL DATA:  Chills and fever  EXAM: CHEST  2 VIEW  COMPARISON:  DG CHEST 1V PORT dated 08/12/2013  FINDINGS: The heart size and mediastinal contours are within normal limits. Both lungs are clear. The visualized skeletal structures are unremarkable.  IMPRESSION: No active cardiopulmonary disease.   Electronically  Signed   By: Conchita Paris M.D.   On: 09/29/2013 16:30   US Abdomen Complete  09/24/2013   CLINICAL DATA:  Abdominal pain.  Elevated liver function tests.  EXAM: ULTRASOUND ABDOMEN COMPLETE  COMPARISON:  CT, 08/13/2013  FINDINGS: Gallbladder:  No gallstones or wall thickening visualized. No sonographic Murphy sign noted.  Common bile duct:  Diameter: 6.6 mm.  No evidence of a duct stone.  Liver:  Liver is echogenic with poor through transmission of the sound beam, findings consistent with extensive fatty infiltration. Liver is borderline enlarged. No liver mass or focal lesion. Hepatopetal flow documented in the portal vein.  IVC:  No abnormality visualized.  Pancreas:  Visualized portion unremarkable.  Spleen:  Size and appearance within normal limits.  Right Kidney:  Length: 11.1 cm. Echogenicity within normal limits. No mass or hydronephrosis visualized.  Left Kidney:  Length: 11.1 cm. Echogenicity within normal limits. No mass or hydronephrosis visualized.  Abdominal aorta:  No aneurysm visualized.  Other findings:  None.  IMPRESSION: 1. Extensive hepatic steatosis. 2. Normal gallbladder.  No acute findings.  No other abnormalities.   Electronically Signed   By: Lajean Manes M.D.   On: 09/24/2013 13:40   Ct Abdomen Pelvis W Contrast  09/24/2013   CLINICAL DATA:  Emesis for over 1 month. Indented for pancreatitis last month.  EXAM: CT ABDOMEN AND PELVIS WITH CONTRAST  TECHNIQUE: Multidetector CT imaging of the abdomen and pelvis was performed using the standard protocol following bolus administration of intravenous contrast.  CONTRAST:  132mL OMNIPAQUE IOHEXOL 300 MG/ML  SOLN  COMPARISON:  08/13/2013  FINDINGS: Liver is mildly enlarged. There is extensive fatty infiltration. No liver mass or focal lesion.  Pancreas is unremarkable.  There is no evidence of pancreatitis.  Normal spleen and gallbladder. No bile duct dilation. Normal adrenal glands. Normal kidneys, ureters and bladder.  No pathologically  enlarged lymph nodes. There are no abnormal fluid collections.  The stomach is unremarkable. Normal small bowel. Normal colon. Appendix is not visualized.  No significant bony abnormality.  IMPRESSION: 1. No acute findings.  No evidence of pancreatitis. 2. Mild hepatomegaly and extensive hepatic steatosis, unchanged. 3. Exam otherwise unremarkable.   Electronically Signed   By: Lajean Manes M.D.   On: 09/24/2013 15:48   Mr 3d Recon At Scanner  09/27/2013   CLINICAL DATA:  Nausea, vomiting and diarrhea over 1 month. Recent history of pancreatitis. Evaluate for potential gallstones versus biliary sludge.  EXAM: MR 3D RECON AT SCANNER; MR ABDOMEN WO/W CM  MRCP  TECHNIQUE: Multiplanar multisequence MR imaging of the abdomen was performed, including heavily T2-weighted images of the biliary and pancreatic ducts. Three-dimensional MR images were rendered by post processing of the original MR data.  CONTRAST:  61mL MULTIHANCE GADOBENATE DIMEGLUMINE 529 MG/ML IV SOLN  COMPARISON:  CT of the abdomen and pelvis 09/24/2013. Abdominal ultrasound 09/24/2013.  FINDINGS: Out of phase dual echo images demonstrate diffuse heterogeneous loss of signal intensity throughout the hepatic parenchyma, compatible with hepatic steatosis, most severe throughout the right lobe of the liver. No focal hepatic lesions are noted. MRCP images demonstrate no intra or extrahepatic biliary ductal dilatation. No pancreatic ductal dilatation. No definite filling defects within the gallbladder are noted to suggest presence of gallstones or a significant volume of biliary sludge. Common bile duct is normal in caliber and appearance measuring 3 mm in the porta hepatis.  The appearance of the pancreas, spleen, bilateral adrenal glands and bilateral kidneys is unremarkable. Within the visualized portions of the peritoneal cavity there is no significant volume of ascites. No suspicious lymphadenopathy.  IMPRESSION: 1. No evidence of gallstones. No evidence  of biliary sludge. No findings to suggest biliary tract obstruction. 2. Severe hepatic steatosis, as above.   Electronically Signed   By: Vinnie Langton M.D.   On: 09/27/2013 12:28   Mr Abd W/wo Cm/mrcp  09/27/2013   CLINICAL DATA:  Nausea, vomiting and diarrhea over 1 month. Recent history of pancreatitis. Evaluate for potential gallstones versus biliary sludge.  EXAM: MR 3D RECON AT SCANNER; MR ABDOMEN WO/W CM MRCP  TECHNIQUE: Multiplanar multisequence MR imaging of the abdomen was performed, including heavily T2-weighted images of the biliary and pancreatic ducts. Three-dimensional MR images were rendered by post processing of the original MR data.  CONTRAST:  59mL MULTIHANCE GADOBENATE DIMEGLUMINE 529 MG/ML IV SOLN  COMPARISON:  CT of the abdomen and pelvis 09/24/2013. Abdominal ultrasound 09/24/2013.  FINDINGS: Out of phase dual echo images demonstrate diffuse heterogeneous loss of signal intensity throughout the hepatic parenchyma, compatible with hepatic steatosis, most severe throughout the right lobe of the liver. No focal hepatic lesions are noted. MRCP images demonstrate no intra or extrahepatic biliary ductal dilatation. No pancreatic ductal dilatation. No definite filling defects within the gallbladder are noted to suggest presence of gallstones or a significant volume of biliary sludge. Common bile duct is normal in caliber and appearance measuring 3 mm in the porta hepatis.  The appearance of the pancreas, spleen, bilateral adrenal glands and bilateral kidneys is unremarkable. Within the visualized portions of the peritoneal cavity there is no significant volume of ascites. No suspicious lymphadenopathy.  IMPRESSION: 1. No evidence of gallstones. No evidence of biliary sludge. No findings to suggest biliary tract obstruction. 2. Severe hepatic steatosis, as above.   Electronically Signed   By: Vinnie Langton M.D.   On: 09/27/2013 12:28    Microbiology: Recent Results (from the past 240  hour(s))  URINE CULTURE     Status: None   Collection Time    09/24/13 11:27 AM      Result Value Ref Range Status   Specimen Description URINE, CLEAN CATCH   Final   Special Requests NONE   Final   Culture  Setup Time     Final   Value: 09/24/2013 14:59     Performed at [facility]     Final   Value: 60,000 COLONIES/ML     Performed at [facility]  Final   Value: LACTOBACILLUS SPECIES     Note: Standardized susceptibility testing for this organism is not available.     Performed at [facility]   Report Status 09/25/2013 FINAL   Final     Labs: Basic Metabolic Panel:  Recent Labs Lab 09/24/13 1124 09/25/13 0455 09/27/13 0535 09/28/13 0930  NA 139 138 141 141  K 3.5* 3.4* 3.5* 3.5*  CL 97 100 108 105  CO2 19 24 21 20   GLUCOSE 146* 110* 128* 112*  BUN 5* 6 5* <3*  CREATININE 1.05 1.06 0.88 0.81  CALCIUM 9.2 8.1* 7.9* 8.8   Liver Function Tests:  Recent Labs Lab 09/24/13 1124 09/25/13 0455 09/26/13 1300 09/27/13 0535 09/28/13 0930  AST 223* 115* 175* 131* 96*  ALT 211* 146* 147* 127* 123*  ALKPHOS 118* 92 92 83 76  BILITOT 1.9* 1.4* 1.0 0.6 0.9  PROT 8.0 6.3 7.1 5.9* 6.5  ALBUMIN 4.1 3.3* 3.6 3.1* 3.4*    Recent Labs Lab 09/24/13 1124 09/27/13 0535  LIPASE 41 74*   No results found for this basename: AMMONIA,  in the last 168 hours CBC:  Recent Labs Lab 09/24/13 1124 09/25/13 0455 09/27/13 0535 09/30/13 0527  WBC 13.1* 7.6 7.4 6.0  NEUTROABS 11.0*  --   --   --   HGB 17.8* 14.8 14.0 13.8  HCT 48.4 41.5 39.7 39.3  MCV 95.1 97.4 98.3 97.8  PLT 309 237 211 152   Cardiac Enzymes: No results found for this basename: CKTOTAL, CKMB, CKMBINDEX, TROPONINI,  in the last 168 hours BNP: BNP (last 3 results) No results found for this basename: PROBNP,  in the last 8760 hours CBG: No results found for this basename: GLUCAP,  in the last 168 hours     Signed:  Kelvin Cellar  [facility] 09/30/2013, 9:44 AM

## 2013-10-01 ENCOUNTER — Encounter: Payer: Self-pay | Admitting: Internal Medicine

## 2013-10-01 DIAGNOSIS — R197 Diarrhea, unspecified: Secondary | ICD-10-CM

## 2013-10-01 LAB — RESPIRATORY VIRUS PANEL
Adenovirus: NOT DETECTED
INFLUENZA A H3: NOT DETECTED
INFLUENZA B 1: NOT DETECTED
Influenza A H1: NOT DETECTED
Influenza A: NOT DETECTED
Metapneumovirus: NOT DETECTED
PARAINFLUENZA 1 A: NOT DETECTED
PARAINFLUENZA 3 A: NOT DETECTED
Parainfluenza 2: NOT DETECTED
Respiratory Syncytial Virus A: NOT DETECTED
Respiratory Syncytial Virus B: NOT DETECTED
Rhinovirus: NOT DETECTED

## 2013-10-01 NOTE — Progress Notes (Signed)
[facility] PROGRESS NOTE  Richard House JIR:[id number] DOB: May 07, 1986 DOA: 09/24/2013 PCP: [name], MD  Assessment/Plan: 1. Suspected pancreatitis. Patient was seen and evaluated by gastroenterology, recommending IV hydration, pain control, bowel rest. Although lipase was negative, his clinical presentation seems most consistent with pancreatitis. MRCP did not show evidence of gallstones, biliary sludge or biliary tract obstruction. Sed rate within normal range at 15. He continues to complain of severe abdominal pain although patient complaints may be out of proportion to physical exam findings.  2. Nausea/Vomiting. Unclear etiology. He states having several episodes of N/V today which actually haven't been witnessed by RN. Repeat Lipase level normal. Would avoid all IV narcotic agents and scale back on his oxycodone does as this may be the cause of his nausea. I spoke to his Gastroenterologist Dr Benson Norway today, who will see him this evening.  3. Elevated transaminases. Patient having fatty liver based on ultrasound. Liver enzymes are trending down.  4. Abdominal Pain. Patient is nontoxic, now tolerating PO intake, having a normal sed rate. Work up has been unremarkable. Will start carafate and increase his protonix to 40mg  PO BID 5. Gastroesophageal reflux disease. Continue PPI therapy  Code Status: Full code Disposition Plan: Continues to complain of N/V, unwitnessed. Spoke with GI who will evaluate this evening. Hopefully can be discharged tomorrow.    Consultants:  Gastroenterology   Antibiotics:  None  HPI/Subjective: Patient is a pleasant 28 year old gentleman with a past medical history drug abuse, alcohol abuse, admitted to the medicine service on 09/24/2013, presenting with complaints of nausea, vomiting, epigastric pain. 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 This morning patient denied nausea vomiting,  requesting for study events. He does complain of ongoing abdominal pain stating that IV dilaudid worked better than IV morphine. During my encounter he did not appear to be in severe distress, as I found him resting comfortably.   Patient reporting ongoing N/V. I have discontinued IV narcotics and decreased his oxycodone dose. He appears nontoxic.   Objective: Filed Vitals:   10/01/13 1402  BP: 130/83  Pulse: 75  Temp: 97.5 F (36.4 C)  Resp: 16    Intake/Output Summary (Last 24 hours) at 10/01/13 1559 Last data filed at 10/01/13 0900  Gross per 24 hour  Intake    240 ml  Output      0 ml  Net    240 ml   Filed Weights   09/24/13 1810  Weight: 104.146 kg (229 lb 9.6 oz)    Exam:   General:  Patient does not appear to be in acute distress, found resting comfortably in bed.  Cardiovascular:  Regular rate and rhythm normal S1-S2 no murmurs or gallop  Respiratory: Clear to auscultation bilaterally, normal respiratory effort  Abdomen: He has generalized abdominal pain, worse in the epigastric region, no rebound tenderness or guarding  Musculoskeletal: No edema  Data Reviewed: Basic Metabolic Panel:  Recent Labs Lab 09/25/13 0455 09/27/13 0535 09/28/13 0930 09/30/13 1109  NA 138 141 141 138  K 3.4* 3.5* 3.5* 3.1*  CL 100 108 105 99  CO2 24 21 20 24   GLUCOSE 110* 128* 112* 126*  BUN 6 5* <3* 7  CREATININE 1.06 0.88 0.81 0.84  CALCIUM 8.1* 7.9* 8.8 8.4   Liver Function Tests:  Recent Labs Lab 09/25/13 0455 09/26/13 1300 09/27/13 0535 09/28/13 0930 09/30/13 1109  AST 115* 175* 131* 96* 70*  ALT 146* 147* 127* 123* 95*  ALKPHOS 92 92 83 76 59  BILITOT 1.4* 1.0 0.6 0.9 0.7  PROT 6.3 7.1 5.9* 6.5 6.0  ALBUMIN 3.3* 3.6 3.1* 3.4* 3.1*    Recent Labs Lab 09/27/13 0535 09/30/13 1109  LIPASE 74* 64*   No results found for this basename: AMMONIA,  in the last 168 hours CBC:  Recent Labs Lab 09/25/13 0455 09/27/13 0535 09/30/13 0527  WBC 7.6 7.4  6.0  HGB 14.8 14.0 13.8  HCT 41.5 39.7 39.3  MCV 97.4 98.3 97.8  PLT 237 211 152   Cardiac Enzymes: No results found for this basename: CKTOTAL, CKMB, CKMBINDEX, TROPONINI,  in the last 168 hours BNP (last 3 results) No results found for this basename: PROBNP,  in the last 8760 hours CBG: No results found for this basename: GLUCAP,  in the last 168 hours  Recent Results (from the past 240 hour(s))  URINE CULTURE     Status: None   Collection Time    09/24/13 11:27 AM      Result Value Ref Range Status   Specimen Description URINE, CLEAN CATCH   Final   Special Requests NONE   Final   Culture  Setup Time     Final   Value: 09/24/2013 14:59     Performed at [facility]     Final   Value: 60,000 COLONIES/ML     Performed at [facility]   Culture     Final   Value: LACTOBACILLUS SPECIES     Note: Standardized susceptibility testing for this organism is not available.     Performed at [facility]   Report Status 09/25/2013 FINAL   Final     Studies: No results found.  Scheduled Meds: . ondansetron (ZOFRAN) IV  4 mg Intravenous 4 times per day  . pantoprazole  40 mg Oral BID  . sodium chloride  3 mL Intravenous Q12H  . sucralfate  1 g Oral TID WC & HS   Continuous Infusions:    Principal Problem:   Pancreatitis, acute, cryptogenic Active Problems:   Nausea with vomiting   Elevated LFTs   Essential hypertension, benign   Hepatic steatosis   Hypokalemia   Dehydration    Time spent: 35 minutes    Kelvin Cellar  [facility] Pager [phone]. If 7PM-7AM, please contact night-coverage at [email or web address], password [facility] 10/01/2013, 3:59 PM  LOS: 7 days

## 2013-10-01 NOTE — Progress Notes (Signed)
The patient is clinically stable.  In fact, he appears too well to be in the hospital.  I realize that he may have some nausea and vomiting, but it is not to the point that he is dehydrated.  He report that the taper in his pain medications has allowed him to see how much the prior pain medications were helping.  The current work for his pain is unknown.  I am still uncertain if this is from a pancreatic issue.  He certainly had abnormal liver enzymes.    I discussed the situation with him and he can be discharged him.  I would provide him with pain medications at a higher dose and/or frequency with a taper.  His PCP can follow up with him and manage him as an outpatient.  He has the "orange card" and I do not accept that card as an outpatient.

## 2013-10-02 LAB — COMPREHENSIVE METABOLIC PANEL
ALT: 83 U/L — AB (ref 0–53)
AST: 55 U/L — AB (ref 0–37)
Albumin: 3.4 g/dL — ABNORMAL LOW (ref 3.5–5.2)
Alkaline Phosphatase: 55 U/L (ref 39–117)
BUN: 11 mg/dL (ref 6–23)
CALCIUM: 8.8 mg/dL (ref 8.4–10.5)
CO2: 27 meq/L (ref 19–32)
Chloride: 101 mEq/L (ref 96–112)
Creatinine, Ser: 0.91 mg/dL (ref 0.50–1.35)
GFR calc Af Amer: >90 mL/min (ref >90)
GFR calc non Af Amer: >90 mL/min (ref >90)
Glucose, Bld: 111 mg/dL — ABNORMAL HIGH (ref 70–99)
Potassium: 4 mEq/L (ref 3.7–5.3)
SODIUM: 139 meq/L (ref 137–147)
TOTAL PROTEIN: 6.5 g/dL (ref 6.0–8.3)
Total Bilirubin: 0.6 mg/dL (ref 0.3–1.2)

## 2013-10-02 LAB — CBC
HCT: 38 % — ABNORMAL LOW (ref 39.0–52.0)
HEMOGLOBIN: 13.3 g/dL (ref 13.0–17.0)
MCH: 34.1 pg — ABNORMAL HIGH (ref 26.0–34.0)
MCHC: 35 g/dL (ref 30.0–36.0)
MCV: 97.4 fL (ref 78.0–100.0)
Platelets: 230 10*3/uL (ref 150–400)
RBC: 3.9 MIL/uL — ABNORMAL LOW (ref 4.22–5.81)
RDW: 14.1 % (ref 11.5–15.5)
WBC: 6.4 10*3/uL (ref 4.0–10.5)

## 2013-10-02 MED ORDER — PROMETHAZINE HCL 25 MG RE SUPP: 25.0000 mg | Freq: Four times a day (QID) | RECTAL | Status: DC | PRN

## 2013-10-02 MED ORDER — OXYCODONE HCL 5 MG PO TABS: 5.0000 mg | ORAL_TABLET | ORAL | Status: DC | PRN

## 2013-10-02 NOTE — Progress Notes (Signed)
RN reviewed discharge instructions with patient. Prescriptions and paperwork given. All questions answered. Patient ambulated down to family car.

## 2013-10-04 ENCOUNTER — Ambulatory Visit (INDEPENDENT_AMBULATORY_CARE_PROVIDER_SITE_OTHER): Payer: No Typology Code available for payment source | Admitting: Internal Medicine

## 2013-10-04 ENCOUNTER — Encounter: Payer: Self-pay | Admitting: Internal Medicine

## 2013-10-04 VITALS — BP 124/80 | HR 60 | Ht 72.0 in | Wt 230.0 lb

## 2013-10-04 DIAGNOSIS — F1011 Alcohol abuse, in remission: Secondary | ICD-10-CM | POA: Insufficient documentation

## 2013-10-04 DIAGNOSIS — K859 Acute pancreatitis without necrosis or infection, unspecified: Secondary | ICD-10-CM

## 2013-10-04 DIAGNOSIS — K7689 Other specified diseases of liver: Secondary | ICD-10-CM

## 2013-10-04 DIAGNOSIS — K760 Fatty (change of) liver, not elsewhere classified: Secondary | ICD-10-CM

## 2013-10-04 DIAGNOSIS — R1013 Epigastric pain: Secondary | ICD-10-CM

## 2013-10-04 DIAGNOSIS — R748 Abnormal levels of other serum enzymes: Secondary | ICD-10-CM | POA: Insufficient documentation

## 2013-10-04 MED ORDER — OXYCODONE HCL 10 MG PO TABS: 10.0000 mg | ORAL_TABLET | Freq: Four times a day (QID) | ORAL | Status: DC | PRN

## 2013-10-04 MED ORDER — PANTOPRAZOLE SODIUM 40 MG PO TBEC: 40.0000 mg | DELAYED_RELEASE_TABLET | Freq: Two times a day (BID) | ORAL | Status: DC

## 2013-10-04 MED ORDER — PROMETHAZINE HCL 25 MG PO TABS: 25.0000 mg | ORAL_TABLET | Freq: Four times a day (QID) | ORAL | Status: DC | PRN

## 2013-10-04 NOTE — Patient Instructions (Signed)
Discontinue Phenergan suppositories.   We have sent the following medications to your pharmacy for you to pick up at your convenience: Phenergan tablets, Protonix and we printed the oxycodone.   You have been scheduled for an endoscopy with propofol. Please follow written instructions given to you at your visit today. If you use inhalers (even only as needed), please bring them with you on the day of your procedure.  Avoid alcohol and any nonsteroidal ant-inflammatory drugs such as ibuprofen.

## 2013-10-04 NOTE — Progress Notes (Signed)
Patient ID: Richard House, male   DOB: 1986-07-16, 28 y.o.   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 He was seen in December 2014 by the GI consult team, Dr. Carlean Purl.  He is here alone today. During his initial hospitalization he was found to have pancreatitis felt to be alcohol induced. Imaging did not reveal evidence of gallstones or biliary ductal dilatation. He also had elevated liver enzymes which was felt to be secondary to alcohol and fatty liver disease. HIV and viral hepatitis studies were negative.  He was admitted initially from 08/12/2013 2 08/20/2013 and then again from 09/24/2013 and 10/02/2013.    Today he reports he continues to have constant epigastric abdominal pain, though this is slightly better than in weeks past. After his recent hospitalization his pantoprazole was increased to twice daily and sucralfate was added. He says the Carafate has helped a lot. He's been using rectal Phenergan for nausea. This helps his nausea considerably and he is no longer having significant vomiting. Previously vomiting have been a big problem and on one occasion he had a episode of scant hematemesis. Hematemesis has resolved. He was previously having diarrhea which also has improved. He is now having to loose to formed nonbloody nonbloody stools daily. He has started to have back bland foods to his diet and seems to be tolerating this of late. He is using oxycodone every 5-6 hours for epigastric pain. He is no longer using an antidiarrheal.  He reports previous heroin addiction and was on methadone 120 mg daily for 3 years. He self titrated his medication off as of mid-December 2014. He had been on methadone for 3 years and he weaned off by decreasing his dose of 5 mg a week until he was off. He also  reports previously heavy alcohol use 8-10 drinks a day approximately 5 days per week. He's had no drink since mid December when he was admitted with pancreatitis. He has no plans to drink alcohol again or use illicit drugs. He is not currently in AA or NA.  He does report he has a very supportive mother who is very involved. His mother controls his administration of oxycodone. He denies a history of prescription drug abuse. He has a family history notable for alcoholism in his father but no known history of liver disease.  Past Medical History  Diagnosis Date  . Hypertension   . Vertigo   . Ulcer   . Pancreatitis, acute 08/14/2013    Past Surgical History  Procedure Laterality Date  . Appendectomy      Current Outpatient Prescriptions  Medication Sig Dispense Refill  . loperamide (IMODIUM) 1 MG/5ML solution Take 2 mg by mouth daily as needed for diarrhea or loose stools.      . Oxycodone HCl 10 MG TABS Take 1 tablet (10 mg total) by mouth every 6 (six) hours as needed.  60 tablet  0  . pantoprazole (PROTONIX) 40 MG tablet Take 1 tablet (40 mg total) by mouth 2 (two) times daily.  60 tablet  11  . sucralfate (CARAFATE) 1 G tablet Take 1 tablet (1 g total) by mouth 4 (four) times daily -  with meals and at bedtime.  90 tablet  1  . promethazine (PHENERGAN) 25 MG tablet Take 1 tablet (25 mg total) by mouth every 6 (  six) hours as needed for nausea or vomiting.  30 tablet  1   No current facility-administered medications for this visit.    No Known Allergies  Family History  Problem Relation Age of Onset  . High blood pressure Mother   . High blood pressure Father   . Migraines Paternal Grandmother     History  Substance Use Topics  . Smoking status: Current Every Day Smoker -- 1.00 packs/day for .5 years    Types: Cigarettes  . Smokeless tobacco: Never Used  . Alcohol Use: No    ROS: As per history of present illness, otherwise negative  BP 124/80  Pulse 60  Ht 6' (1.829  m)  Wt 230 lb (104.327 kg)  BMI 31.19 kg/m2 Constitutional: Well-developed and well-nourished. No distress. HEENT: Normocephalic and atraumatic. Oropharynx is clear and moist. No oropharyngeal exudate. Conjunctivae are normal.  No scleral icterus. Neck: Neck supple. Trachea midline. Cardiovascular: Normal rate, regular rhythm and intact distal pulses. No M/R/G Pulmonary/chest: Effort normal and breath sounds normal. No wheezing, rales or rhonchi. Abdominal: Soft, moderate epigastric pain without rebound or guarding, nondistended. Bowel sounds active throughout. Multiple abdominal striae Extremities: no clubbing, cyanosis, or edema Lymphadenopathy: No cervical adenopathy noted. Neurological: Alert and oriented to person place and time. Skin: Skin is warm and dry. No rashes noted. Psychiatric: Normal mood and affect. Behavior is normal.  RELEVANT LABS AND IMAGING: CBC    Component Value Date/Time   WBC 6.4 10/02/2013 0510   RBC 3.90* 10/02/2013 0510   HGB 13.3 10/02/2013 0510   HCT 38.0* 10/02/2013 0510   PLT 230 10/02/2013 0510   MCV 97.4 10/02/2013 0510   MCH 34.1* 10/02/2013 0510   MCHC 35.0 10/02/2013 0510   RDW 14.1 10/02/2013 0510   LYMPHSABS 1.5 09/24/2013 1124   MONOABS 0.5 09/24/2013 1124   EOSABS 0.1 09/24/2013 1124   BASOSABS 0.0 09/24/2013 1124    CMP     Component Value Date/Time   NA 139 10/02/2013 0510   K 4.0 10/02/2013 0510   CL 101 10/02/2013 0510   CO2 27 10/02/2013 0510   GLUCOSE 111* 10/02/2013 0510   BUN 11 10/02/2013 0510   CREATININE 0.91 10/02/2013 0510   CALCIUM 8.8 10/02/2013 0510   PROT 6.5 10/02/2013 0510   ALBUMIN 3.4* 10/02/2013 0510   AST 55* 10/02/2013 0510   ALT 83* 10/02/2013 0510   ALKPHOS 55 10/02/2013 0510   BILITOT 0.6 10/02/2013 0510   GFRNONAA >90 10/02/2013 0510   GFRAA >90 10/02/2013 0510   Lipase     Component Value Date/Time   LIPASE 64* 09/30/2013 1109   CT ABDOMEN AND PELVIS WITH CONTRAST -- DEC 2014   TECHNIQUE: Multidetector CT imaging of the  abdomen and pelvis was performed using the standard protocol following bolus administration of intravenous contrast.   CONTRAST:  15mL OMNIPAQUE IOHEXOL 300 MG/ML  SOLN   COMPARISON:  Ultrasound 08/12/2013 abdomen.   FINDINGS: Lower Chest: Patchy subsegmental atelectasis at the bases. Gynecomastia, incompletely imaged. Normal heart size without pericardial or pleural effusion.   Abdomen/Pelvis: Moderate to marked hepatic steatosis. This is somewhat heterogeneous, and more pronounced in the anterior segment right liver lobe. Hepatomegaly, greater than 20 cm craniocaudal. Normal spleen, stomach.   Mild edema is identified about the pancreatic head and uncinate process as well as throughout the remainder of the anterior pararenal space. Example image 38/series 2 and extending more inferiorly on image 51/series 2. Pancreas enhances normally, without evidence of peripancreatic fluid collection,  pancreatic ductal dilatation.   No calcified gallstones or evidence of acute cholecystitis. No biliary ductal dilatation or evidence of choledocholithiasis.   All vascular structures enhance normally.   Normal adrenal glands and kidneys. No retroperitoneal or retrocrural adenopathy. The distal colon is underdistended. Apparent wall thickening is felt to be secondary. Example within the sigmoid on image 80/series 2. Normal terminal ileum and appendix. Appendix not visualized. Normal small bowel, without abdominal intraperitoneal fluid or free intraperitoneal air.   Small fat containing left inguinal hernia. No pelvic adenopathy. Normal urinary bladder and prostate. Trace cul-de-sac fluid which is likely secondary.   Bones/Musculoskeletal:  No acute osseous abnormality.   IMPRESSION: 1. Findings of uncomplicated mild pancreatitis, centered within the head and uncinate process. 2. Hepatic steatosis and hepatomegaly. 3. Apparent distal colonic thickening is favored to be due  to underdistention. Difficult to exclude concurrent infectious colitis. 4. Small volume cul-de-sac fluid which is likely secondary to the the pancreatic process. 5. Tiny fat containing left inguinal hernia. 6. Gynecomastia.  ___________________________________________________________________________________ CT ABDOMEN AND PELVIS WITH CONTRAST -- FEB 2015   TECHNIQUE: Multidetector CT imaging of the abdomen and pelvis was performed using the standard protocol following bolus administration of intravenous contrast.   CONTRAST:  177mL OMNIPAQUE IOHEXOL 300 MG/ML  SOLN   COMPARISON:  08/13/2013   FINDINGS: Liver is mildly enlarged. There is extensive fatty infiltration. No liver mass or focal lesion.   Pancreas is unremarkable.  There is no evidence of pancreatitis.   Normal spleen and gallbladder. No bile duct dilation. Normal adrenal glands. Normal kidneys, ureters and bladder.   No pathologically enlarged lymph nodes. There are no abnormal fluid collections.   The stomach is unremarkable. Normal small bowel. Normal colon. Appendix is not visualized.   No significant bony abnormality.   IMPRESSION: 1. No acute findings.  No evidence of pancreatitis. 2. Mild hepatomegaly and extensive hepatic steatosis, unchanged. 3. Exam otherwise unremarkable. _________________________________________________________________________________________  MR 3D RECON AT [facility]; MR ABDOMEN WO/W CM MRCP -- FEB 2015   TECHNIQUE: Multiplanar multisequence MR imaging of the abdomen was performed, including heavily T2-weighted images of the biliary and pancreatic ducts. Three-dimensional MR images were rendered by post processing of the original MR data.   CONTRAST:  4mL MULTIHANCE GADOBENATE DIMEGLUMINE 529 MG/ML IV SOLN   COMPARISON:  CT of the abdomen and pelvis 09/24/2013. Abdominal ultrasound 09/24/2013.   FINDINGS: Out of phase dual echo images demonstrate diffuse heterogeneous  loss of signal intensity throughout the hepatic parenchyma, compatible with hepatic steatosis, most severe throughout the right lobe of the liver. No focal hepatic lesions are noted. MRCP images demonstrate no intra or extrahepatic biliary ductal dilatation. No pancreatic ductal dilatation. No definite filling defects within the gallbladder are noted to suggest presence of gallstones or a significant volume of biliary sludge. Common bile duct is normal in caliber and appearance measuring 3 mm in the porta hepatis.   The appearance of the pancreas, spleen, bilateral adrenal glands and bilateral kidneys is unremarkable. Within the visualized portions of the peritoneal cavity there is no significant volume of ascites. No suspicious lymphadenopathy.   IMPRESSION: 1. No evidence of gallstones. No evidence of biliary sludge. No findings to suggest biliary tract obstruction. 2. Severe hepatic steatosis, as above.    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  1.  Epigastric pain/pancreatitis --  his cause for pancreatitis is felt to be alcohol related and his Richard-sectional imaging has been reviewed. His most recent CT of the abdomen and pelvis along with MRI/MRCP showed improvement in pancreatitis which is likely related to alcohol cessation. I have recommended upper endoscopy to evaluate for gastroduodenitis and ulcer disease. This will also help exclude H. pylori. We discussed this test including the risks and benefits and he is agreeable to proceed. For now he will continue pantoprazole 40 mg twice daily and Carafate before meals and at bedtime. I will change his promethazine to oral rather than rectal as needed for nausea. I've asked that he avoid alcohol NSAIDs. I will give him a prescription for  oxycodone to be used sparingly as needed for epigastric pain while we are evaluating the pain more fully. We had a very frank discussion about his history of heroin addiction and that I will not write chronic narcotics for him.  He seems very much motivated to avoid chronic narcotics and remain off methadone. His mother will continue to dispense this pain medication for him.  2.  Elevated liver enzymes/fatty liver -- his liver enzymes have slowly improved likely secondary to alcohol cessation. Viral hepatitis studies negative. We discussed chronic liver inflammation and the risk for scarring over time. There is no evidence for cirrhosis at this time. We will plan to follow his liver enzymes closely to ensure normalization.  If they remain elevated further testing could be considered, such as ANA, IGG to exclude AIH, though this is felt much less likely  3.  Alcohol abuse -- currently in remission. I have recommended AA meetings and that he strictly avoid alcohol. He seems motivated at present to do so.  4.  Gynecomastia/abdominal stria -- will check an early AM cortisol.  Follow-up with PCP.

## 2013-10-05 NOTE — H&P (Signed)
[facility] History and Physical  Richard House XBD:[id number] DOB: May 21, 1986 DOA: 09/24/2013  Referring physician: ED PCP: [name], MD  Specialists: GI  Chief Complaint: Nausea vomiting  HPI: Richard House is a 28 y.o. male, known prior history her abuse, current smoker, mildly obese, recently admitted 12/28-1/5 with acute pancreatitis in a setting of chronic nausea but today started admission developed epigastric pain consistent with the same came it was thought on that admission that his nausea vomiting was from opiate withdrawal.-He and extensive serological workup with negative HIV, hepatitis panel and was discharged home He presented to to [facility] ed 10/05/2013 with almost 25 days history of nausea vomiting. He states that 3 days subsequent to being discharged he had further episodes of nausea vomiting difficulty holding food down. He describes epigastric pain which is knifelike in nature and boring through to the back without any CVA tenderness or other symptoms Has not been able to tolerate liquids for 3 days Has not been able to tolerate solid food for 4-5 days  He hasn't had any alcohol and maybe a month and weaned off of methadone on 12/24 completely-please note that he was admitted 12/28 so this is probably not secondary to withdrawal from opiates. He denies any dysuria any contact with ill children and no strange food or rashes. He has not noticed dark stool or light stool tarry stool  He has been worked up for his chronic vertigo by neurologist who did not denote any signs of nystagmus or cerebellar dysfunction and an MRI brain was performed on 12/16 which was completely normal He has gained about 30 pounds in the past 8 months which coincides with the time that he started to taper off and stopped using heroin  he had an episode of unilateral right gynecomastia which has been tapped and is negative for malignancy   Emergency room workup = sodium 139 potassium 3.5, local  son 59, alkaline phosphatase 118, AST 223, ALT 211, total bili 1.9 Anion gap 19  potassium 3.5 WC 13.1 hemoglobin 17.8 Urinalysis-orange color, moderate bilirubin, moderate ketonuria 15 small nitrates positive leukocyte mild proteinuria   Review of Systems: The patient denies chest pain blurred vision double vision weakness and left-sided body contacts Rash Other issues above  Past Medical History  Diagnosis Date  . Hypertension   . Vertigo   . Ulcer   . Pancreatitis, acute 08/14/2013   Past Surgical History  Procedure Laterality Date  . Appendectomy     Social History:  History   Social History Narrative   Care from [place] in June 2014   Unemployed currently and is staying with his uncle here in [place] here to get cleaned out from his drug issues   Smoker half pack per day since age [age]   Start here in 3-1/2 years ago and was on methadone until 6 months ago and goes to see [facility] treatment center where he was weaned off of this on 12/24          No Known Allergies  Family History  Problem Relation Age of Onset  . High blood pressure Mother   . High blood pressure Father   . Migraines Paternal Grandmother     Prior to Admission medications   Medication Sig Start Date End Date Taking? Authorizing Provider  bismuth subsalicylate (PEPTO BISMOL) 262 MG/15ML suspension Take 30 mLs by mouth every 6 (six) hours as needed for indigestion.   Yes Historical Provider, MD  loperamide (IMODIUM) 1 MG/5ML solution Take  2 mg by mouth daily as needed for diarrhea or loose stools.   Yes Historical Provider, MD  metoCLOPramide (REGLAN) 10 MG tablet Take 1 tablet (10 mg total) by mouth every 6 (six) hours as needed for nausea or vomiting. 09/06/13  Yes [name], MD  pantoprazole (PROTONIX) 40 MG tablet Take 1 tablet (40 mg total) by mouth daily. 09/06/13  Yes [name], MD  promethazine (PHENERGAN) 25 MG suppository Place 1 suppository (25 mg total)  rectally every 6 (six) hours as needed for nausea or vomiting. 08/20/13  Yes Janece Canterbury, MD  traMADol (ULTRAM) 50 MG tablet Take 100 mg by mouth every 6 (six) hours as needed for moderate pain.   Yes Historical Provider, MD   Physical Exam: Filed Vitals:   10/01/13 0610 10/01/13 1402 10/01/13 2203 10/02/13 0555  BP: 117/74 130/83 145/91 119/73  Pulse: 67 75 101 51  Temp: 98 F (36.7 C) 97.5 F (36.4 C) 98 F (36.7 C) 97.9 F (36.6 C)  TempSrc: Oral  Oral Oral  Resp: 16 16 16 16   Height:      Weight:      SpO2: 93% 98% 96% 98%     General:  EOMI, NCAT, not dress  Eyes: See above  ENT: No thyromegaly  Neck: Soft supple  Cardiovascular: S1-S2 no murmur rub or gallop  Respiratory: Clinically clear  Abdomen: Soft, epigastric tenderness noted no rebound, lower abdominal striae  Skin: no le edema   Musculoskeletal: ROM intact  Psychiatric: euthymic  Neurologic: Grossly intact no asterixis, 5/5 power major muscle groups, ambulatory  Labs on Admission:  Basic Metabolic Panel:  Recent Labs Lab 09/30/13 1109 10/02/13 0510  NA 138 139  K 3.1* 4.0  CL 99 101  CO2 24 27  GLUCOSE 126* 111*  BUN 7 11  CREATININE 0.84 0.91  CALCIUM 8.4 8.8   Liver Function Tests:  Recent Labs Lab 09/30/13 1109 10/02/13 0510  AST 70* 55*  ALT 95* 83*  ALKPHOS 59 55  BILITOT 0.7 0.6  PROT 6.0 6.5  ALBUMIN 3.1* 3.4*    Recent Labs Lab 09/30/13 1109  LIPASE 64*   No results found for this basename: AMMONIA,  in the last 168 hours CBC:  Recent Labs Lab 09/30/13 0527 10/02/13 0510  WBC 6.0 6.4  HGB 13.8 13.3  HCT 39.3 38.0*  MCV 97.8 97.4  PLT 152 230   Cardiac Enzymes: No results found for this basename: CKTOTAL, CKMB, CKMBINDEX, TROPONINI,  in the last 168 hours  BNP (last 3 results) No results found for this basename: PROBNP,  in the last 8760 hours CBG: No results found for this basename: GLUCAP,  in the last 168 hours  Radiological Exams on  Admission: No results found.  EKG: Independently reviewed. None performed  Assessment/Plan    Pancreatitis, acute, cryptogenic-patient has well known heavy alcohol use in the past with hepatic steatosis however I am not clear as to why he still has epigastric tenderness in addition to elevated LFTs. If his LFTs do not improve, we will involve gastroenterology once again to help [place] determine how to proceed. He could have hypertriglyceridemia causing pancreatitis-and repeat his lipid panel as fish oil may not be working in his specific case and he may need a fenofibrate in addition to Crestor. I tend to believe him when he says he has not had alcohol.  For his nausea and vomiting we will schedule Zofran every 6 when necessary. Keep n.p.o. for now---> I would discontinue  his Reglan as it has to be used cautiously with liver dysfunction. I consulted Dr. Collene Mares of gastroenterology recommends a drug screen and will have her partner Dr. hung see the patient in the morning-input appreciated in advance   Essential hypertension, benign-blood pressure elevated secondary to pain. Monitor and add medications if elevated   Unlikely urinary tract infection-patient did not capture urine specimen with clean catch. He just urinated into receptacle. Discontinued Rocephin started in the emergency room   Benign paroxysmal positional vertigo-No nystagmus per prior exams   Hepatic steatosis-secondary to ETOH use   Hypokalemia-resolved   UTI (lower urinary tract infection)  Time spent: [place], [facility] Pager [phone]  If 7PM-7AM, please contact night-coverage [email or web address] Password [facility] 10/05/2013, 3:58 PM

## 2013-10-08 ENCOUNTER — Inpatient Hospital Stay: Payer: No Typology Code available for payment source

## 2013-10-09 ENCOUNTER — Encounter: Payer: Self-pay | Admitting: Internal Medicine

## 2013-10-09 ENCOUNTER — Ambulatory Visit (AMBULATORY_SURGERY_CENTER): Payer: No Typology Code available for payment source | Admitting: Internal Medicine

## 2013-10-09 VITALS — BP 99/63 | HR 59 | Temp 97.5°F | Resp 13 | Ht 72.0 in | Wt 230.0 lb

## 2013-10-09 DIAGNOSIS — D131 Benign neoplasm of stomach: Secondary | ICD-10-CM

## 2013-10-09 DIAGNOSIS — R1013 Epigastric pain: Secondary | ICD-10-CM

## 2013-10-09 DIAGNOSIS — K319 Disease of stomach and duodenum, unspecified: Secondary | ICD-10-CM

## 2013-10-09 MED ORDER — SODIUM CHLORIDE 0.9 % IV SOLN: 500.0000 mL | INTRAVENOUS | Status: DC

## 2013-10-09 NOTE — Progress Notes (Signed)
Called to room to assist during endoscopic procedure.  Patient ID and intended procedure confirmed with present staff. Received instructions for my participation in the procedure from the performing physician.  

## 2013-10-09 NOTE — Patient Instructions (Signed)
YOU HAD AN ENDOSCOPIC PROCEDURE TODAY AT THE [facility]: Refer to the procedure report that was given to you for any specific questions about what was found during the examination.  If the procedure report does not answer your questions, please call your gastroenterologist to clarify.  If you requested that your care partner not be given the details of your procedure findings, then the procedure report has been included in a sealed envelope for you to review at your convenience later.  YOU SHOULD EXPECT: Some feelings of bloating in the abdomen. Passage of more gas than usual.  Walking can help get rid of the air that was put into your GI tract during the procedure and reduce the bloating. If you had a lower endoscopy (such as a colonoscopy or flexible sigmoidoscopy) you may notice spotting of blood in your stool or on the toilet paper. If you underwent a bowel prep for your procedure, then you may not have a normal bowel movement for a few days.  DIET: Your first meal following the procedure should be a light meal and then it is ok to progress to your normal diet.  A half-sandwich or bowl of soup is an example of a good first meal.  Heavy or fried foods are harder to digest and may make you feel nauseous or bloated.  Likewise meals heavy in dairy and vegetables can cause extra gas to form and this can also increase the bloating.  Drink plenty of fluids but you should avoid alcoholic beverages for 24 hours.  ACTIVITY: Your care partner should take you home directly after the procedure.  You should plan to take it easy, moving slowly for the rest of the day.  You can resume normal activity the day after the procedure however you should NOT DRIVE or use heavy machinery for 24 hours (because of the sedation medicines used during the test).    SYMPTOMS TO REPORT IMMEDIATELY: A gastroenterologist can be reached at any hour.  During normal business hours, 8:30 AM to 5:00 PM Monday through Friday,  call [phone].  After hours and on weekends, please call the GI answering service at [phone] who will take a message and have the physician on call contact you.  Following upper endoscopy (EGD)  Vomiting of blood or coffee ground material  New chest pain or pain under the shoulder blades  Painful or persistently difficult swallowing  New shortness of breath  Fever of 100F or higher  Black, tarry-looking stools  FOLLOW UP: If any biopsies were taken you will be contacted by phone or by letter within the next 1-3 weeks.  Call your gastroenterologist if you have not heard about the biopsies in 3 weeks.  Our staff will call the home number listed on your records the next business day following your procedure to check on you and address any questions or concerns that you may have at that time regarding the information given to you following your procedure. This is a courtesy call and so if there is no answer at the home number and we have not heard from you through the emergency physician on call, we will assume that you have returned to your regular daily activities without incident.  SIGNATURES/CONFIDENTIALITY: You and/or your care partner have signed paperwork which will be entered into your electronic medical record.  These signatures attest to the fact that that the information above on your After Visit Summary has been reviewed and is understood.  Full responsibility of   the confidentiality of this discharge information lies with you and/or your care-partner. 

## 2013-10-09 NOTE — Op Note (Signed)
[place]  [address] [place], [id number]   ENDOSCOPY PROCEDURE REPORT  PATIENT: Richard House, Richard House  MR#: [id number] BIRTHDATE: [birth year] , 28  yrs. old GENDER: Male ENDOSCOPIST: [name], MD PROCEDURE DATE:  10/09/2013 PROCEDURE:  EGD w/ biopsy for H.pylori ASA CLASS:     Class III INDICATIONS:  Epigastric pain. MEDICATIONS: MAC sedation, administered by CRNA and propofol (Diprivan) 200mg  IV TOPICAL ANESTHETIC: Cetacaine Spray  DESCRIPTION OF PROCEDURE: After the risks benefits and alternatives of the procedure were thoroughly explained, informed consent was obtained.  The LB [id number] endoscope was introduced through the mouth and advanced to the second portion of the duodenum. Without limitations.  The instrument was slowly withdrawn as the mucosa was fully examined.    ESOPHAGUS: Small inlet patches on opposite walls in the proximal stomach (18 cm from the incisors). The esophagus was otherwise normal.  STOMACH: There was mild antral gastropathy noted.  Cold forcep biopsies were taken in the gastric body, antrum and angularis. The stomach otherwise appeared normal.  DUODENUM: The duodenal mucosa showed no abnormalities in the bulb and second portion of the duodenum.  Retroflexed views revealed no abnormalities.     The scope was then withdrawn from the patient and the procedure completed.  COMPLICATIONS: There were no complications. ENDOSCOPIC IMPRESSION: 1.   Inlet patch, esophagus was otherwise normal. 2.   There was mild antral gastropathy noted; multiple biopsies 3.   The proximal stomach appeared normal 4.   The duodenal mucosa showed no abnormalities in the bulb and second portion of the duodenum  RECOMMENDATIONS: 1.  Await pathology results 2.  Continue taking your PPI (antiacid medicine) once daily.  It is best to be taken 20-30 minutes prior to breakfast meal. 3.  Follow-up of helicobacter pylori status, treat if  indicated 4.  Return to clinic for follow-up as advised  eSigned:  [name], MD 10/09/2013 11:13 AM     CC:The Patient and [name] MD

## 2013-10-10 ENCOUNTER — Telehealth: Payer: Self-pay | Admitting: *Deleted

## 2013-10-10 NOTE — Telephone Encounter (Signed)
Left message that we called for f/u 

## 2013-10-18 ENCOUNTER — Other Ambulatory Visit: Payer: Self-pay | Admitting: Gastroenterology

## 2013-10-18 ENCOUNTER — Encounter: Payer: Self-pay | Admitting: Internal Medicine

## 2013-10-18 ENCOUNTER — Telehealth: Payer: Self-pay | Admitting: Gastroenterology

## 2013-10-18 DIAGNOSIS — K859 Acute pancreatitis without necrosis or infection, unspecified: Secondary | ICD-10-CM

## 2013-10-18 DIAGNOSIS — K760 Fatty (change of) liver, not elsewhere classified: Secondary | ICD-10-CM

## 2013-10-18 DIAGNOSIS — R945 Abnormal results of liver function studies: Secondary | ICD-10-CM

## 2013-10-18 DIAGNOSIS — R7989 Other specified abnormal findings of blood chemistry: Secondary | ICD-10-CM

## 2013-10-18 NOTE — Telephone Encounter (Signed)
lvm for pt to call me back regarding AM lab

## 2013-10-27 ENCOUNTER — Emergency Department (HOSPITAL_COMMUNITY): Payer: No Typology Code available for payment source

## 2013-10-27 ENCOUNTER — Inpatient Hospital Stay (HOSPITAL_COMMUNITY)
Admission: EM | Admit: 2013-10-27 | Discharge: 2013-10-28 | DRG: 439 | Disposition: A | Discharge status: Home / Self Care | Payer: No Typology Code available for payment source | Attending: Internal Medicine | Admitting: Internal Medicine

## 2013-10-27 ENCOUNTER — Encounter (HOSPITAL_COMMUNITY): Payer: Self-pay | Admitting: Emergency Medicine

## 2013-10-27 DIAGNOSIS — F1011 Alcohol abuse, in remission: Secondary | ICD-10-CM

## 2013-10-27 DIAGNOSIS — R748 Abnormal levels of other serum enzymes: Secondary | ICD-10-CM

## 2013-10-27 DIAGNOSIS — E876 Hypokalemia: Secondary | ICD-10-CM

## 2013-10-27 DIAGNOSIS — R945 Abnormal results of liver function studies: Secondary | ICD-10-CM

## 2013-10-27 DIAGNOSIS — E860 Dehydration: Secondary | ICD-10-CM

## 2013-10-27 DIAGNOSIS — K7689 Other specified diseases of liver: Secondary | ICD-10-CM | POA: Diagnosis present

## 2013-10-27 DIAGNOSIS — F17200 Nicotine dependence, unspecified, uncomplicated: Secondary | ICD-10-CM | POA: Diagnosis present

## 2013-10-27 DIAGNOSIS — E785 Hyperlipidemia, unspecified: Secondary | ICD-10-CM | POA: Diagnosis present

## 2013-10-27 DIAGNOSIS — H8110 Benign paroxysmal vertigo, unspecified ear: Secondary | ICD-10-CM

## 2013-10-27 DIAGNOSIS — F411 Generalized anxiety disorder: Secondary | ICD-10-CM | POA: Diagnosis present

## 2013-10-27 DIAGNOSIS — K859 Acute pancreatitis without necrosis or infection, unspecified: Principal | ICD-10-CM

## 2013-10-27 DIAGNOSIS — R109 Unspecified abdominal pain: Secondary | ICD-10-CM

## 2013-10-27 DIAGNOSIS — K219 Gastro-esophageal reflux disease without esophagitis: Secondary | ICD-10-CM | POA: Diagnosis present

## 2013-10-27 DIAGNOSIS — R112 Nausea with vomiting, unspecified: Secondary | ICD-10-CM

## 2013-10-27 DIAGNOSIS — K760 Fatty (change of) liver, not elsewhere classified: Secondary | ICD-10-CM

## 2013-10-27 DIAGNOSIS — R1013 Epigastric pain: Secondary | ICD-10-CM

## 2013-10-27 DIAGNOSIS — N390 Urinary tract infection, site not specified: Secondary | ICD-10-CM

## 2013-10-27 DIAGNOSIS — I10 Essential (primary) hypertension: Secondary | ICD-10-CM

## 2013-10-27 DIAGNOSIS — F1193 Opioid use, unspecified with withdrawal: Secondary | ICD-10-CM

## 2013-10-27 DIAGNOSIS — R7989 Other specified abnormal findings of blood chemistry: Secondary | ICD-10-CM

## 2013-10-27 DIAGNOSIS — F1123 Opioid dependence with withdrawal: Secondary | ICD-10-CM

## 2013-10-27 DIAGNOSIS — Z8249 Family history of ischemic heart disease and other diseases of the circulatory system: Secondary | ICD-10-CM

## 2013-10-27 DIAGNOSIS — R269 Unspecified abnormalities of gait and mobility: Secondary | ICD-10-CM

## 2013-10-27 DIAGNOSIS — Z888 Allergy status to other drugs, medicaments and biological substances status: Secondary | ICD-10-CM

## 2013-10-27 LAB — CBC WITH DIFFERENTIAL/PLATELET
Basophils Absolute: 0.1 10*3/uL (ref 0.0–0.1)
Basophils Relative: 0 % (ref 0–1)
EOS PCT: 1 % (ref 0–5)
Eosinophils Absolute: 0.1 10*3/uL (ref 0.0–0.7)
HEMATOCRIT: 49.6 % (ref 39.0–52.0)
Hemoglobin: 17.8 g/dL — ABNORMAL HIGH (ref 13.0–17.0)
LYMPHS ABS: 2.1 10*3/uL (ref 0.7–4.0)
Lymphocytes Relative: 18 % (ref 12–46)
MCH: 34 pg (ref 26.0–34.0)
MCHC: 35.9 g/dL (ref 30.0–36.0)
MCV: 94.7 fL (ref 78.0–100.0)
MONO ABS: 0.6 10*3/uL (ref 0.1–1.0)
MONOS PCT: 5 % (ref 3–12)
Neutro Abs: 8.8 10*3/uL — ABNORMAL HIGH (ref 1.7–7.7)
Neutrophils Relative %: 75 % (ref 43–77)
PLATELETS: 322 10*3/uL (ref 150–400)
RBC: 5.24 MIL/uL (ref 4.22–5.81)
RDW: 15 % (ref 11.5–15.5)
WBC: 11.7 10*3/uL — ABNORMAL HIGH (ref 4.0–10.5)

## 2013-10-27 LAB — URINALYSIS, ROUTINE W REFLEX MICROSCOPIC
Glucose, UA: NEGATIVE mg/dL
HGB URINE DIPSTICK: NEGATIVE
Ketones, ur: NEGATIVE mg/dL
Nitrite: NEGATIVE
PROTEIN: 100 mg/dL — AB
Specific Gravity, Urine: 1.038 — ABNORMAL HIGH (ref 1.005–1.030)
Urobilinogen, UA: 1 mg/dL (ref 0.0–1.0)
pH: 6 (ref 5.0–8.0)

## 2013-10-27 LAB — COMPREHENSIVE METABOLIC PANEL
ALT: 136 U/L — AB (ref 0–53)
AST: 89 U/L — ABNORMAL HIGH (ref 0–37)
Albumin: 4.4 g/dL (ref 3.5–5.2)
Alkaline Phosphatase: 101 U/L (ref 39–117)
BUN: 4 mg/dL — AB (ref 6–23)
CALCIUM: 9.9 mg/dL (ref 8.4–10.5)
CO2: 25 meq/L (ref 19–32)
Chloride: 97 mEq/L (ref 96–112)
Creatinine, Ser: 1.06 mg/dL (ref 0.50–1.35)
GFR calc Af Amer: >90 mL/min (ref >90)
GLUCOSE: 120 mg/dL — AB (ref 70–99)
Potassium: 3.5 mEq/L — ABNORMAL LOW (ref 3.7–5.3)
SODIUM: 142 meq/L (ref 137–147)
Total Bilirubin: 0.8 mg/dL (ref 0.3–1.2)
Total Protein: 7.8 g/dL (ref 6.0–8.3)

## 2013-10-27 LAB — URINE MICROSCOPIC-ADD ON

## 2013-10-27 LAB — LIPASE, BLOOD: LIPASE: 17 U/L (ref 11–59)

## 2013-10-27 MED ORDER — HYDROMORPHONE HCL PF 1 MG/ML IJ SOLN
1.0000 mg | Freq: Once | INTRAMUSCULAR | Status: AC
  Administered 2013-10-27: 1 mg via INTRAVENOUS
  Filled 2013-10-27: qty 1

## 2013-10-27 MED ORDER — PROMETHAZINE HCL 25 MG/ML IJ SOLN
25.0000 mg | Freq: Once | INTRAMUSCULAR | Status: AC
  Administered 2013-10-27: 25 mg via INTRAMUSCULAR
  Filled 2013-10-27: qty 1

## 2013-10-27 MED ORDER — SODIUM CHLORIDE 0.9 % IJ SOLN
10.0000 mL | INTRAMUSCULAR | Status: DC | PRN
Start: 2013-10-27 — End: 2013-10-28
  Administered 2013-10-28: 10 mL

## 2013-10-27 MED ORDER — PROMETHAZINE HCL 25 MG/ML IJ SOLN
12.5000 mg | Freq: Once | INTRAMUSCULAR | Status: AC
  Administered 2013-10-27: 12.5 mg via INTRAVENOUS
  Filled 2013-10-27: qty 1

## 2013-10-27 MED ORDER — SODIUM CHLORIDE 0.9 % IJ SOLN: 10.0000 mL | Freq: Two times a day (BID) | INTRAMUSCULAR | Status: DC

## 2013-10-27 MED ORDER — PROMETHAZINE HCL 25 MG/ML IJ SOLN
12.5000 mg | Freq: Four times a day (QID) | INTRAMUSCULAR | Status: DC | PRN
  Filled 2013-10-27: qty 1

## 2013-10-27 MED ORDER — KETOROLAC TROMETHAMINE 30 MG/ML IJ SOLN
30.0000 mg | Freq: Once | INTRAMUSCULAR | Status: AC
  Administered 2013-10-27: 30 mg via INTRAVENOUS
  Filled 2013-10-27: qty 1

## 2013-10-27 MED ORDER — HYDROMORPHONE HCL PF 2 MG/ML IJ SOLN
1.5000 mg | INTRAMUSCULAR | Status: DC | PRN
  Administered 2013-10-27 – 2013-10-28 (×7): 1.5 mg via INTRAVENOUS
  Filled 2013-10-27 (×7): qty 1

## 2013-10-27 MED ORDER — ONDANSETRON HCL 4 MG/2ML IJ SOLN: 4.0000 mg | Freq: Once | INTRAMUSCULAR | Status: DC

## 2013-10-27 MED ORDER — HYDROMORPHONE HCL PF 1 MG/ML IJ SOLN: 1.0000 mg | INTRAMUSCULAR | Status: AC | PRN

## 2013-10-27 MED ORDER — POTASSIUM CHLORIDE IN NACL 20-0.9 MEQ/L-% IV SOLN
Freq: Once | INTRAVENOUS | Status: AC
  Administered 2013-10-27: 16:00:00 via INTRAVENOUS
  Filled 2013-10-27: qty 1000

## 2013-10-27 MED ORDER — PANTOPRAZOLE SODIUM 40 MG IV SOLR
40.0000 mg | Freq: Once | INTRAVENOUS | Status: AC
  Administered 2013-10-27: 40 mg via INTRAVENOUS
  Filled 2013-10-27: qty 40

## 2013-10-27 MED ORDER — SODIUM CHLORIDE 0.9 % IV SOLN
INTRAVENOUS | Status: DC
  Administered 2013-10-27 – 2013-10-28 (×2): via INTRAVENOUS

## 2013-10-27 MED ORDER — HYDROMORPHONE HCL PF 1 MG/ML IJ SOLN
1.0000 mg | Freq: Once | INTRAMUSCULAR | Status: AC
Start: 2013-10-27 — End: 2013-10-27
  Administered 2013-10-27: 1 mg via INTRAMUSCULAR
  Filled 2013-10-27: qty 1

## 2013-10-27 MED ORDER — DEXTROSE 5 % IV SOLN
1.0000 g | INTRAVENOUS | Status: DC
  Administered 2013-10-27: 1 g via INTRAVENOUS
  Filled 2013-10-27 (×2): qty 10

## 2013-10-27 MED ORDER — PROMETHAZINE HCL 25 MG/ML IJ SOLN
25.0000 mg | Freq: Three times a day (TID) | INTRAMUSCULAR | Status: DC | PRN
  Administered 2013-10-27 – 2013-10-28 (×2): 25 mg via INTRAVENOUS
  Filled 2013-10-27 (×2): qty 1

## 2013-10-27 MED ORDER — SODIUM CHLORIDE 0.9 % IV BOLUS (SEPSIS)
1000.0000 mL | Freq: Once | INTRAVENOUS | Status: AC
  Administered 2013-10-27: 1000 mL via INTRAVENOUS

## 2013-10-27 MED ORDER — ENOXAPARIN SODIUM 40 MG/0.4ML ~~LOC~~ SOLN
40.0000 mg | SUBCUTANEOUS | Status: DC
  Administered 2013-10-27: 40 mg via SUBCUTANEOUS
  Filled 2013-10-27 (×3): qty 0.4

## 2013-10-27 NOTE — ED Notes (Signed)
MD and Chong Sicilian, RN at bedside continuing IV attempt.

## 2013-10-27 NOTE — ED Notes (Signed)
He c/o upper abd. Pain plus occasional vomiting x 2 days.  He recognizes it as pancreatitis pain.  He is actively vomiting as I triage him.

## 2013-10-27 NOTE — H&P (Signed)
History and Physical    Richard House BOF:[id number] DOB: 1986-04-09 DOA: 10/27/2013  Referring physician: Dr. Dorna Mai PCP: [name], MD  Specialists: none  Chief Complaint: Abdominal pain, nausea and vomiting  HPI: Richard House is a 28 y.o. 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 He was recently hospitalized from 08/12/2013 - 08/20/2013 with acute alcoholic pancreatitis. He states that since that time, he hasn't had any alcoholic beverages. He also had another hospitalization between 2/9 and 2/15 in which underwent a CT scan of the abdomen and pelvis without findings of acute pancreatitis, he also had an MRCP which was normal. He endorses sticking to a low fat diet and couldn't identify any triggers for his abdominal pain. He denies any fever or chills. He denies any chest pain or breathing difficulties. He does endorse dysuria and occasional burning with urination. Is not sexually active. He denies any current use of drugs. In the emergency room, patient with intractable abdominal pain, nausea and vomiting. Blood work remarkable for mild leukocytosis, normal lipase, elevated LFTs with ALT at 136 and greater than AST of 89. Urinalysis with few bacteria and trace leukocytes. He also had abdominal ultrasound without acute findings. Initially he was tachycardic into the 120s, improved to 80s with pain control and after 2 L bolus of normal saline.  He has established as an outpatient with Dr. Hilarie Fredrickson from GI, and was seen last on 10/04/2013, and he had some epigastric abdominal pain at that time but it appeared to be  improved. For his persistent abdominal pain, he underwent an EGD on 10/09/2013 with inlet patch in the esophagus but otherwise unremarkable. Biopsies were taken and they were normal.  He told Dr. Dorna Mai on admission that he did not fill his oxycodone prescription from Dr. Hilarie Fredrickson office in February, and EDP was very specific about their conversation. He initially mentioned that he only took the 12 pills that were prescribed on discharge and nothing else. I checked [place] database and it seems like he did fill the Oxycodone from Dr. Hilarie Fredrickson. When I asked him about that he recalled that he filled it but only took few pills and threw the rest away being afraid that he will abuse them.   Review of Systems: As per history of present illness, otherwise negative  Past Medical History  Diagnosis Date  . Hypertension   . Vertigo   . Pancreatitis, acute 08/14/2013  . Ulcer     gastric uler  . GERD (gastroesophageal reflux disease)   . Hyperlipidemia     no meds  . Substance abuse     tx of methadon  . Anxiety     panic attack while having a MRI   Past Surgical History  Procedure Laterality Date  . Appendectomy     Social History:  reports that he has been smoking Cigarettes.  He has a .5 pack-year smoking history. He has never used smokeless tobacco. He reports that he uses illicit drugs. He reports that he does not drink alcohol.  Allergies  Allergen Reactions  . Zofran [Ondansetron Hcl]     Migraines    Family History  Problem Relation Age of  Onset  . High blood pressure Mother   . Colon polyps Mother   . High blood pressure Father   . Migraines Paternal Grandmother   . Colon cancer Neg Hx   . Esophageal cancer Neg Hx   . Prostate cancer Neg Hx   . Pancreatic cancer Neg Hx   . Rectal cancer Neg Hx   . Stomach cancer Neg Hx    Prior to Admission medications   Medication Sig Start Date End Date Taking? Authorizing Provider  acetaminophen (TYLENOL) 500 MG tablet Take 1,000 mg by mouth  every 6 (six) hours as needed for headache.   Yes Historical Provider, MD  bismuth subsalicylate (PEPTO BISMOL) 262 MG/15ML suspension Take 30 mLs by mouth every 6 (six) hours as needed for indigestion.   Yes Historical Provider, MD  pantoprazole (PROTONIX) 40 MG tablet Take 1 tablet (40 mg total) by mouth 2 (two) times daily. 10/04/13  Yes [name], MD  sucralfate (CARAFATE) 1 G tablet Take 1 tablet (1 g total) by mouth 4 (four) times daily -  with meals and at bedtime. 09/30/13  Yes Kelvin Cellar, MD   Physical Exam: Filed Vitals:   10/27/13 1107 10/27/13 1206 10/27/13 1404  BP: 112/76 133/92 122/85  Pulse: 132 92 108  Temp: 98.1 F (36.7 C)  98.3 F (36.8 C)  TempSrc: Oral  Oral  Resp: 20 17 21   SpO2: 98% 94% 94%    General:  In mild distress due to abdominal pain, retching  Eyes: PERRL, EOMI, no scleral icterus  ENT: moist oropharynx  Neck: supple, no JVD  Cardiovascular: regular rate without MRG; 2+ peripheral pulses  Respiratory: CTA biL, good air movement without wheezing, rhonchi or crackled  Abdomen: soft, tender to palpation in epigastric area  Skin: no rashes  Musculoskeletal: no peripheral edema; positive CVA tenderness on left  Psychiatric: normal mood and affect  Neurologic: Nonfocal  Labs on Admission:  Basic Metabolic Panel:  Recent Labs Lab 10/27/13 1145  NA 142  K 3.5*  CL 97  CO2 25  GLUCOSE 120*  BUN 4*  CREATININE 1.06  CALCIUM 9.9   Liver Function Tests:  Recent Labs Lab 10/27/13 1145  AST 89*  ALT 136*  ALKPHOS 101  BILITOT 0.8  PROT 7.8  ALBUMIN 4.4    Recent Labs Lab 10/27/13 1145  LIPASE 17   CBC:  Recent Labs Lab 10/27/13 1145  WBC 11.7*  NEUTROABS 8.8*  HGB 17.8*  HCT 49.6  MCV 94.7  PLT 322   Radiological Exams on Admission: US Abdomen Complete  10/27/2013   CLINICAL DATA:  Abdominal pain.  EXAM: ULTRASOUND ABDOMEN COMPLETE  COMPARISON:  None.  FINDINGS: Gallbladder:  No gallstones or wall  thickening visualized. No sonographic Murphy sign noted.  Common bile duct:  Limited visualization.  The common bile duct measures up to 6 mm.  Liver:  Heterogeneous echotexture with diffuse increased echogenicity. Limited visualization of the left liver due to shadowing bowel gas. No focal abnormality is seen. Antegrade flow in the imaged portal venous system.  IVC:  Not visualized due to bowel gas.  Pancreas:  Visualized portion unremarkable.  Spleen:  Upper limits of normal in size. No focal abnormality or change from prior.  Right Kidney:  Length: 11 cm. Echogenicity within normal limits. No mass or hydronephrosis visualized.  Left Kidney:  Length: 11 cm. Echogenicity within normal limits. No mass or hydronephrosis visualized.  Abdominal aorta:  No aneurysm visualized (bifurcation not imaged).  Other  findings:  None.  IMPRESSION: 1. No acute sonographic findings. 2. Hepatic steatosis.   Electronically Signed   By: Jorje Guild M.D.   On: 10/27/2013 13:35   EKG: Independently reviewed.  Assessment/Plan Principal Problem:   Abdominal pain Active Problems:   Nausea with vomiting   Hepatic steatosis   Hypokalemia   Pancreatitis, acute, cryptogenic   Dehydration   Alcohol abuse, in remission   Elevated liver enzymes   UTI (urinary tract infection)    Abdominal pain/nausea/vomiting - he could have acute on chronic pancreatitis even with a normal lipase. Will treat as such, with IV fluids, pain control, n.p.o. Could consider repeating CT scan of the abdomen and pelvis to check for complications if his symptoms don't improve, but hold that for now as he recently had a negative extensive workup. No apparent triggers. Repeat lipid panel, liver enzymes and lipase tomorrow morning.  UTI - ? Pyelonephritis given left-sided CVA tenderness. Urinalysis not that impressive, but will obtain urine cultures and start ceftriaxone.  Hypokalemia - due to poor by mouth intake. Potassium with IV fluids in the  emergency room. Repeat BMP in the morning. Elevated liver enzymes - not in a pattern consistent with alcohol abuse. Monitor in the morning. Last time his liver enzymes were in the 200s. Alcohol abuse, in remission Polysubstance abuse - was checked for HIV and hepatitis just few weeks ago. Patient does seem very genuine about his drug use, but I have some concerns as outlined above given that his story changed a bit about his oxy prescription. Also please refer to Dr. Dairl Ponder discharge summary in February 2015 as there were some concerns then too.  Tobacco abuse - counseled to quit   Diet: NPO Fluids: NS DVT Prophylaxis: lovenox  Code Status: Full  Family Communication: none  Disposition Plan: inpatient  Time spent: 8  This note has been created with Surveyor, quantity. Any transcriptional errors are unintentional.   Costin M. Cruzita Lederer, MD [facility] Pager [phone]  If 7PM-7AM, please contact night-coverage [email or web address] Password [facility] 10/27/2013, 2:58 PM

## 2013-10-27 NOTE — ED Provider Notes (Signed)
CSN: [id number]     Arrival date & time 10/27/13  1049 History   First MD Initiated Contact with Patient 10/27/13 1057     Chief Complaint  Patient presents with  . Abdominal Pain  . Emesis     (Consider location/radiation/quality/duration/timing/severity/associated sxs/prior Treatment) HPI Comments: Pt with h/o recurrent pancreatitis, followed by [facility] and [facility], h/o alcoholism, heroin abuse and methadone use, has been clean off of heroin for years, no alcohol or methadone since December, has not been on oral narcotics since last admission in late February, and no further drinking, has had 3-4 days of abd pain, upper, N/V, no diarrhea, chills, no fevers.  Pt is dry heaving now, complains of pain that also has some radiation to right lower chest and under ribs, no flank pain.  Level 5 caveat due to acute condition.    Patient is a 28 y.o. male presenting with abdominal pain and vomiting. The history is provided by the patient and medical records.  Abdominal Pain Associated symptoms: vomiting   Emesis Associated symptoms: abdominal pain     Past Medical History  Diagnosis Date  . Hypertension   . Vertigo   . Pancreatitis, acute 08/14/2013  . Ulcer     gastric uler  . GERD (gastroesophageal reflux disease)   . Hyperlipidemia     no meds  . Substance abuse     tx of methadon  . Anxiety     panic attack while having a MRI   Past Surgical History  Procedure Laterality Date  . Appendectomy     Family History  Problem Relation Age of Onset  . High blood pressure Mother   . Colon polyps Mother   . High blood pressure Father   . Migraines Paternal Grandmother   . Colon cancer Neg Hx   . Esophageal cancer Neg Hx   . Prostate cancer Neg Hx   . Pancreatic cancer Neg Hx   . Rectal cancer Neg Hx   . Stomach cancer Neg Hx    History  Substance Use Topics  . Smoking status: Current Every Day Smoker -- 1.00 packs/day for .5 years    Types: Cigarettes  . Smokeless  tobacco: Never Used  . Alcohol Use: No    Review of Systems  Unable to perform ROS: Acuity of condition  Gastrointestinal: Positive for vomiting and abdominal pain.      Allergies  Zofran  Home Medications   Current Outpatient Rx  Name  Route  Sig  Dispense  Refill  . acetaminophen (TYLENOL) 500 MG tablet   Oral   Take 1,000 mg by mouth every 6 (six) hours as needed for headache.         . bismuth subsalicylate (PEPTO BISMOL) 262 MG/15ML suspension   Oral   Take 30 mLs by mouth every 6 (six) hours as needed for indigestion.         . pantoprazole (PROTONIX) 40 MG tablet   Oral   Take 1 tablet (40 mg total) by mouth 2 (two) times daily.   60 tablet   11   . sucralfate (CARAFATE) 1 G tablet   Oral   Take 1 tablet (1 g total) by mouth 4 (four) times daily -  with meals and at bedtime.   90 tablet   1    BP 122/85  Pulse 108  Temp(Src) 98.3 F (36.8 C) (Oral)  Resp 21  SpO2 94% Physical Exam  Nursing note and vitals reviewed. Constitutional:  He appears well-developed and well-nourished. He appears distressed.  HENT:  Head: Normocephalic and atraumatic.  Eyes: Conjunctivae and EOM are normal. No scleral icterus.  Neck: Normal range of motion. Neck supple. No JVD present.  Cardiovascular: Regular rhythm and intact distal pulses.   Occasional extrasystoles are present. Tachycardia present.   No murmur heard. Pulmonary/Chest: Effort normal. No respiratory distress. He has no wheezes.  Abdominal: Soft. He exhibits no distension. There is tenderness. There is guarding. There is no rebound.  Skin: He is diaphoretic. There is pallor.  Psychiatric: His speech is normal and behavior is normal. His mood appears anxious. Cognition and memory are normal.    ED Course  Procedures (including critical care time) Labs Review Labs Reviewed  CBC WITH DIFFERENTIAL - Abnormal; Notable for the following:    WBC 11.7 (*)    Hemoglobin 17.8 (*)    Neutro Abs 8.8 (*)    All  other components within normal limits  COMPREHENSIVE METABOLIC PANEL - Abnormal; Notable for the following:    Potassium 3.5 (*)    Glucose, Bld 120 (*)    BUN 4 (*)    AST 89 (*)    ALT 136 (*)    All other components within normal limits  URINALYSIS, ROUTINE W REFLEX MICROSCOPIC - Abnormal; Notable for the following:    Color, Urine ORANGE (*)    APPearance CLOUDY (*)    Specific Gravity, Urine 1.038 (*)    Bilirubin Urine SMALL (*)    Protein, ur 100 (*)    Leukocytes, UA TRACE (*)    All other components within normal limits  URINE MICROSCOPIC-ADD ON - Abnormal; Notable for the following:    Squamous Epithelial / LPF FEW (*)    Bacteria, UA FEW (*)    All other components within normal limits  LIPASE, BLOOD   Imaging Review US Abdomen Complete  10/27/2013   CLINICAL DATA:  Abdominal pain.  EXAM: ULTRASOUND ABDOMEN COMPLETE  COMPARISON:  None.  FINDINGS: Gallbladder:  No gallstones or wall thickening visualized. No sonographic Murphy sign noted.  Common bile duct:  Limited visualization.  The common bile duct measures up to 6 mm.  Liver:  Heterogeneous echotexture with diffuse increased echogenicity. Limited visualization of the left liver due to shadowing bowel gas. No focal abnormality is seen. Antegrade flow in the imaged portal venous system.  IVC:  Not visualized due to bowel gas.  Pancreas:  Visualized portion unremarkable.  Spleen:  Upper limits of normal in size. No focal abnormality or change from prior.  Right Kidney:  Length: 11 cm. Echogenicity within normal limits. No mass or hydronephrosis visualized.  Left Kidney:  Length: 11 cm. Echogenicity within normal limits. No mass or hydronephrosis visualized.  Abdominal aorta:  No aneurysm visualized (bifurcation not imaged).  Other findings:  None.  IMPRESSION: 1. No acute sonographic findings. 2. Hepatic steatosis.   Electronically Signed   By: Jorje Guild M.D.   On: 10/27/2013 13:35     EKG Interpretation   Date/Time:   Saturday October 27 2013 11:14:42 EDT Ventricular Rate:  126 PR Interval:  120 QRS Duration: 88 QT Interval:  350 QTC Calculation: 507 R Axis:   70 Text Interpretation:  Sinus tachycardia Abnormal T, consider ischemia,  diffuse leads Prolonged QT interval Baseline wander in lead(s) V2 No  previous tracing Abnormal ekg Confirmed by [facility]  MD, MICHEAL ([id number]) on  10/27/2013 11:46:48 AM       RA Sat is 98% and I  interpret to be adequate  2:16 PM Pt has continued to have pain despite many doses of pain meds including toradol, protonix ordered,. U/S is unremarkable, lipase is unremarkable, yet pt continues with pain.  Will consult [facility] hospitalist to evaluate pt.     2:33 PM Dr. Cathren Laine to see and admit to med/surg.  MDM   Final diagnoses:  Abdominal pain  Nausea and vomiting  Hypokalemia    Pt with likely another exacerbation of now seemingly chronic pancreatitis.  Pt reports no use of drugs, narcotics or alcohol recently.  Pt is tachycardic, whiel attempting IV starts which he has trouble with, pt did have a hyperventilation episode with hand cramping, vagal episode with near syncope, HR slowed briefly into the upper 90's down from 130.  Pt given IM meds initially due to acute symptoms and IV difficulty.  Pt has a foot IV for now, but would benefit from UE IV later once hydrated.  PICC is a consideration.  Will treat symptoms, obtain labs, check electrolytes for now.      Saddie Benders. Breely Panik, MD 10/27/13 1433

## 2013-10-27 NOTE — ED Notes (Signed)
US at bedside

## 2013-10-27 NOTE — ED Notes (Signed)
Pt c/o increasing RUQ abdominal pain and emesis x "several months."  Pain score 10/10.  Pt reports that pain has increased over the past two days.  Hx of pancreatitis.  Pt is followed by a GI MD.

## 2013-10-27 NOTE — Progress Notes (Signed)
Peripherally Inserted Central Catheter/Midline Placement  The IV Nurse has discussed with the patient and/or persons authorized to consent for the patient, the purpose of this procedure and the potential benefits and risks involved with this procedure.  The benefits include less needle sticks, lab draws from the catheter and patient may be discharged home with the catheter.  Risks include, but not limited to, infection, bleeding, blood clot (thrombus formation), and puncture of an artery; nerve damage and irregular heat beat.  Alternatives to this procedure were also discussed. BY Jake Samples RN  PICC/Midline Placement Documentation  PICC / Midline Double Lumen 10/27/13 PICC Right Brachial 41 cm 1 cm (Active)  Indication for Insertion or Continuance of Line Poor Vasculature-patient has had multiple peripheral attempts or PIVs lasting less than 24 hours 10/27/2013  6:43 PM  Exposed Catheter (cm) 1 cm 10/27/2013  6:43 PM  Lumen #1 Status Flushed;Saline locked;Blood return noted 10/27/2013  6:43 PM  Lumen #2 Status Flushed;Saline locked;Blood return noted 10/27/2013  6:43 PM  Dressing Change Due 11/03/13 10/27/2013  6:43 PM       Gordan Payment 10/27/2013, 6:46 PM

## 2013-10-28 ENCOUNTER — Inpatient Hospital Stay (HOSPITAL_COMMUNITY): Payer: No Typology Code available for payment source

## 2013-10-28 LAB — LIPASE, BLOOD: LIPASE: 14 U/L (ref 11–59)

## 2013-10-28 LAB — URINE CULTURE
COLONY COUNT: NO GROWTH
Culture: NO GROWTH

## 2013-10-28 LAB — COMPREHENSIVE METABOLIC PANEL WITH GFR
ALT: 80 U/L — ABNORMAL HIGH (ref 0–53)
AST: 51 U/L — ABNORMAL HIGH (ref 0–37)
Albumin: 3.1 g/dL — ABNORMAL LOW (ref 3.5–5.2)
Alkaline Phosphatase: 72 U/L (ref 39–117)
BUN: 5 mg/dL — ABNORMAL LOW (ref 6–23)
CO2: 26 meq/L (ref 19–32)
Calcium: 8.6 mg/dL (ref 8.4–10.5)
Chloride: 102 meq/L (ref 96–112)
Creatinine, Ser: 1.03 mg/dL (ref 0.50–1.35)
GFR calc Af Amer: >90 mL/min (ref >90)
GFR calc non Af Amer: >90 mL/min (ref >90)
Glucose, Bld: 95 mg/dL (ref 70–99)
Potassium: 3.6 meq/L — ABNORMAL LOW (ref 3.7–5.3)
Sodium: 141 meq/L (ref 137–147)
Total Bilirubin: 0.9 mg/dL (ref 0.3–1.2)
Total Protein: 5.9 g/dL — ABNORMAL LOW (ref 6.0–8.3)

## 2013-10-28 LAB — CBC
HEMATOCRIT: 41.3 % (ref 39.0–52.0)
Hemoglobin: 14.1 g/dL (ref 13.0–17.0)
MCH: 33.4 pg (ref 26.0–34.0)
MCHC: 34.1 g/dL (ref 30.0–36.0)
MCV: 97.9 fL (ref 78.0–100.0)
PLATELETS: 210 10*3/uL (ref 150–400)
RBC: 4.22 MIL/uL (ref 4.22–5.81)
RDW: 15.1 % (ref 11.5–15.5)
WBC: 8.2 10*3/uL (ref 4.0–10.5)

## 2013-10-28 LAB — LIPID PANEL
Cholesterol: 94 mg/dL (ref 0–200)
HDL: 23 mg/dL — ABNORMAL LOW (ref >39)
LDL Cholesterol: 28 mg/dL (ref 0–99)
Total CHOL/HDL Ratio: 4.1 ratio
Triglycerides: 217 mg/dL — ABNORMAL HIGH (ref <150)
VLDL: 43 mg/dL — ABNORMAL HIGH (ref 0–40)

## 2013-10-28 MED ORDER — CEFUROXIME AXETIL 500 MG PO TABS: 500.0000 mg | ORAL_TABLET | Freq: Two times a day (BID) | ORAL | Status: DC

## 2013-10-28 MED ORDER — OXYCODONE HCL 5 MG PO TABS: 5.0000 mg | ORAL_TABLET | Freq: Four times a day (QID) | ORAL | Status: DC | PRN

## 2013-10-28 MED ORDER — ACETAMINOPHEN 500 MG PO TABS: 500.0000 mg | ORAL_TABLET | Freq: Three times a day (TID) | ORAL | Status: DC | PRN

## 2013-10-28 MED ORDER — SUCRALFATE 1 G PO TABS: 1.0000 g | ORAL_TABLET | Freq: Three times a day (TID) | ORAL | Status: DC

## 2013-10-28 MED ORDER — BISMUTH SUBSALICYLATE 262 MG/15ML PO SUSP: 30.0000 mL | Freq: Four times a day (QID) | ORAL | Status: DC | PRN

## 2013-10-28 MED ORDER — POTASSIUM CHLORIDE CRYS ER 20 MEQ PO TBCR
40.0000 meq | EXTENDED_RELEASE_TABLET | Freq: Once | ORAL | Status: AC
  Administered 2013-10-28: 40 meq via ORAL
  Filled 2013-10-28: qty 2

## 2013-10-28 MED ORDER — PANTOPRAZOLE SODIUM 40 MG PO TBEC: 40.0000 mg | DELAYED_RELEASE_TABLET | Freq: Two times a day (BID) | ORAL | Status: DC

## 2013-10-28 NOTE — Progress Notes (Signed)
Assessment unchanged. Pain decreased. VS WNL post PICC line removal. Dressing to RUA clean, dry and intact. Pt verbalized understanding of dc instructions through teach back. Understands to follow-up with PCP to obtain blood work in next 7 days, as well as follow up with GI. Scripts x 5 given as provided by MD. Discharged via foot per request to front entrance to meet awaiting vehicle to carry home. Accompanied by NT.

## 2013-10-28 NOTE — Plan of Care (Signed)
Problem: Phase III Progression Outcomes Goal: Pain controlled on oral analgesia Outcome: Not Met (add Reason) Pt refused to take po analgesia prior to discharge home, stating " I want another dose of IV dilaudid before I go home. Pt reported " the pain pill ordered I've had before and it will be fine when I leave."  Pt aware and verbalized understanding that monitoring for 30 to 60 minutes post next IV analgesic dose will be required.

## 2013-10-28 NOTE — Discharge Summary (Signed)
Richard House, is a 28 y.o. male  DOB 01/06/1986  MRN [id number].  Admission date:  10/27/2013  Admitting Physician  Caren Griffins, MD  Discharge Date:  10/28/2013   Primary MD  [name], MD  Recommendations for primary care physician for things to follow:   Follow narcotic use please make sure he follows with recommended GI physician in a timely fashion   Admission Diagnosis  Hypokalemia [276.8] Nausea and vomiting [787.01] Abdominal pain [789.00]   Discharge Diagnosis  Hypokalemia [276.8] Nausea and vomiting [787.01] Abdominal pain [789.00]     Principal Problem:   Abdominal pain Active Problems:   Nausea with vomiting   Hepatic steatosis   Hypokalemia   Pancreatitis, acute, cryptogenic   Dehydration   Alcohol abuse, in remission   Elevated liver enzymes   UTI (urinary tract infection)      Past Medical History  Diagnosis Date  . Hypertension   . Vertigo   . Pancreatitis, acute 08/14/2013  . Ulcer     gastric uler  . GERD (gastroesophageal reflux disease)   . Hyperlipidemia     no meds  . Substance abuse     tx of methadon  . Anxiety     panic attack while having a MRI    Past Surgical History  Procedure Laterality Date  . Appendectomy       Discharge Condition: Stable   Follow UP  Follow-up Information   Follow up with JEGEDE, Gabrielle Dare, MD. Schedule an appointment as soon as possible for a visit in 1 week.   Specialty:  Internal Medicine   Contact information:   [place] [address] [phone]       Follow up with PYRTLE, Lajuan Lines, MD. Schedule an appointment as soon as possible for a visit in 3 days.   Specialty:  Gastroenterology   Contact information:   [address] [place] [address] [phone]         Discharge Instructions  and   Discharge Medications          Discharge Orders   Future Orders Complete By Expires   Diet - low sodium heart healthy  As directed    Discharge instructions  As directed    Comments:     Follow with Primary MD JEGEDE, OLUGBEMIGA, MD in 7 days   Get CBC, CMP, checked 7 days by Primary MD and again as instructed by your Primary MD.     Activity: As tolerated with Full fall precautions use walker/cane & assistance as needed   Disposition Home     Diet: Heart Healthy     For Heart failure patients - Check your Weight same time everyday, if you gain over 2 pounds, or you develop in leg swelling, experience more shortness of breath or chest pain, call your Primary MD immediately. Follow Cardiac Low Salt Diet and 1.8 lit/day fluid restriction.   On your next visit with her primary care physician please Get Medicines reviewed and adjusted.  Please request your Prim.MD to go over all Hospital Tests and Procedure/Radiological results at the follow up, please get all Hospital records sent to your Prim MD by signing hospital release before you go home.   If you experience worsening of your admission symptoms, develop shortness of breath, life threatening emergency, suicidal or homicidal thoughts you must seek medical attention immediately by calling 911 or calling your MD immediately  if symptoms less severe.  You Must read complete instructions/literature along with all the possible adverse reactions/side effects for all the Medicines you take and that have been prescribed to you. Take any new Medicines after you have completely understood and accpet all the possible adverse reactions/side effects.   Do not drive and provide baby sitting services if your were admitted for syncope or siezures until you have seen by Primary MD or a Neurologist and advised to do so again.  Do not drive when taking Pain medications.    Do not take more than prescribed Pain, Sleep and Anxiety  Medications  Special Instructions: If you have smoked or chewed Tobacco  in the last 2 yrs please stop smoking, stop any regular Alcohol  and or any Recreational drug use.  Wear Seat belts while driving.   Please note  You were cared for by a hospitalist during your hospital stay. If you have any questions about your discharge medications or the care you received while you were in the hospital after you are discharged, you can call the unit and asked to speak with the hospitalist on call if the hospitalist that took care of you is not available. Once you are discharged, your primary care physician will handle any further medical issues. Please note that NO REFILLS for any discharge medications will be authorized once you are discharged, as it is imperative that you return to your primary care physician (or establish a relationship with a primary care physician if you do not have one) for your aftercare needs so that they can reassess your need for medications and monitor your lab values.   Increase activity slowly  As directed        Medication List         acetaminophen 500 MG tablet  Commonly known as:  TYLENOL  Take 1 tablet (500 mg total) by mouth every 8 (eight) hours as needed for headache.     bismuth subsalicylate 99991111 [id number] suspension  Commonly known as:  PEPTO BISMOL  Take 30 mLs by mouth every 6 (six) hours as needed for indigestion.     cefUROXime 500 MG tablet  Commonly known as:  CEFTIN  Take 1 tablet (500 mg total) by mouth 2 (two) times daily with a meal.     oxyCODONE 5 MG immediate release tablet  Commonly known as:  Oxy IR/ROXICODONE  Take 1 tablet (5 mg total) by mouth every 6 (six) hours as needed for severe pain.     pantoprazole 40 MG tablet  Commonly known as:  PROTONIX  Take 1 tablet (40 mg total) by mouth 2 (two) times daily.     sucralfate 1 G tablet  Commonly known as:  CARAFATE  Take 1 tablet (1 g total) by mouth 4 (four) times daily -  with meals  and at bedtime.          Diet and Activity recommendation: See Discharge Instructions above   Consults obtained - GI Dr. Olevia Perches over the phone   Major procedures and Radiology Reports - PLEASE review detailed  and final reports for all details, in brief -       Dg Chest 2 View  09/29/2013   CLINICAL DATA:  Chills and fever  EXAM: CHEST  2 VIEW  COMPARISON:  DG CHEST 1V PORT dated 08/12/2013  FINDINGS: The heart size and mediastinal contours are within normal limits. Both lungs are clear. The visualized skeletal structures are unremarkable.  IMPRESSION: No active cardiopulmonary disease.   Electronically Signed   By: Conchita Paris M.D.   On: 09/29/2013 16:30   US Abdomen Complete  10/27/2013   CLINICAL DATA:  Abdominal pain.  EXAM: ULTRASOUND ABDOMEN COMPLETE  COMPARISON:  None.  FINDINGS: Gallbladder:  No gallstones or wall thickening visualized. No sonographic Murphy sign noted.  Common bile duct:  Limited visualization.  The common bile duct measures up to 6 mm.  Liver:  Heterogeneous echotexture with diffuse increased echogenicity. Limited visualization of the left liver due to shadowing bowel gas. No focal abnormality is seen. Antegrade flow in the imaged portal venous system.  IVC:  Not visualized due to bowel gas.  Pancreas:  Visualized portion unremarkable.  Spleen:  Upper limits of normal in size. No focal abnormality or change from prior.  Right Kidney:  Length: 11 cm. Echogenicity within normal limits. No mass or hydronephrosis visualized.  Left Kidney:  Length: 11 cm. Echogenicity within normal limits. No mass or hydronephrosis visualized.  Abdominal aorta:  No aneurysm visualized (bifurcation not imaged).  Other findings:  None.  IMPRESSION: 1. No acute sonographic findings. 2. Hepatic steatosis.   Electronically Signed   By: Jorje Guild M.D.   On: 10/27/2013 13:35   Dg Abd Portable 1v  10/28/2013   CLINICAL DATA:  Abdominal pain  EXAM: PORTABLE ABDOMEN - 1 VIEW  COMPARISON:   None.  FINDINGS: Scattered large and small bowel gas is noted. No abnormal mass or abnormal calcifications are seen. No free air is noted. No bony abnormality is noted.  IMPRESSION: No acute abnormality seen.   Electronically Signed   By: Inez Catalina M.D.   On: 10/28/2013 07:39    Micro Results      No results found for this or any previous visit (from the past 240 hour(s)).   History of present illness and  Hospital Course:     Kindly see H&P for history of present illness and admission details, please review complete Labs, Consult reports and Test reports for all details in brief Richard House, is a 28 y.o. male, patient with history of  polysubstance abuse, in remission, used to be addicted to heroin and transitioned to methadone and finally weaned himself off last December, currently taking no narcotics per patient, presents to the emergency room with a chief complaint of epigastric abdominal pain gradually worsening for the past 2 days associated with nausea vomiting and inability for any by mouth intake. He was recently hospitalized from 08/12/2013 - 08/20/2013 with acute alcoholic pancreatitis. He states that since that time, he hasn't had any alcoholic beverages. He also had another hospitalization between 2/9 and 2/15 in which underwent a CT scan of the abdomen and pelvis without findings of acute pancreatitis, he also had an MRCP which was normal. He endorses sticking to a low fat diet and couldn't identify any triggers for his abdominal pain. He denies any fever or chills. He denies any chest pain or breathing difficulties. He does endorse dysuria and occasional burning with urination. Is not sexually active. He denies any current use of drugs. In the  emergency room, patient with intractable abdominal pain, nausea and vomiting. Blood work remarkable for mild leukocytosis, normal lipase, elevated LFTs with ALT at 136 and greater than AST of 89. Urinalysis with few bacteria and trace  leukocytes. He also had abdominal ultrasound without acute findings. Initially he was tachycardic into the 120s, improved to 80s with pain control and after 2 L bolus of normal saline.      He has established as an outpatient with Dr. Hilarie Fredrickson from GI, and was seen last on 10/04/2013, and he had some epigastric abdominal pain at that time but it appeared to be improved. For his persistent abdominal pain, he underwent an EGD on 10/09/2013 with inlet patch in the esophagus but otherwise unremarkable. Biopsies were taken and they were normal.      In the ER he told the GI physician that he has not refilled his narcotics given by Dr. Hilarie Fredrickson and never took them, however upon further review of health records and [facility] it was clear that he had refilled them, upon being confronted he agreed that he will the narcotics but to only a few and threw the rest to avoid being addicted again. He was admitted for abdominal pain nonspecific along with questionable UTI.    Epigastric abdominal pain, nonspecific. Recent CT scan abdomen pelvis and EGD enema will, liver enzymes were mildly elevated upon admission but they're trending back towards normal. Abdominal ultrasound that showed fatty liver for which she will continue to follow with GI physician in the outpatient setting, I discussed his case with GI physician on call Dr. Delfin Edis who agrees with continuing PPI, Carafate with close outpatient followup with his primary GI physician Dr. Hilarie Fredrickson who did EGD few weeks ago. His abdominal exam is benign. He did want a few narcotic use which will be prescribed. He swears that he's not misusing narcotics anymore.    Possible UTI on admission. Much improved on IV Rocephin, will be placed on Ceftin for a total of 10 day treatment as upon admission there was a question of left flank tenderness, but he doesn't look like typical pyelonephritis. Urine specimen is contaminated and 30 has epithelial cells.  Clinically has responded to Rocephin.    History of alcohol abuse, polysubstance abuse and smoking. Says is obtaining from all. Counseled to continue abstaining.    Patient was offered soft diet which he refused he said he would like to try for regular diet which will be prescribed, if tolerates will be discharged. Low potassium was replaced.  Today   Subjective:   Richard House today has no headache,no chest pain,no new weakness tingling or numbness, feels much better wants to go home today.  Complains of mild epigastric pain which is nonradiating but better than before. Appears to be in no distress whatsoever.  Objective:   Blood pressure 136/89, pulse 69, temperature 98.1 F (36.7 C), temperature source Oral, resp. rate 20, height 6' (1.829 m), weight 101.152 kg (223 lb), SpO2 91.00%.   Intake/Output Summary (Last 24 hours) at 10/28/13 0855 Last data filed at 10/28/13 0600  Gross per 24 hour  Intake 1254.58 ml  Output    401 ml  Net 853.58 ml    Exam Awake Alert, Oriented *3, No new F.N deficits, Normal affect [place].AT,PERRAL Supple Neck,No JVD, No cervical lymphadenopathy appriciated.  Symmetrical Chest wall movement, Good air movement bilaterally, CTAB RRR,No Gallops,Rubs or new Murmurs, No Parasternal Heave +ve B.Sounds, Abd Soft, minimal epigastric tenderness on very deep palpation, No  organomegaly appriciated, No rebound -guarding or rigidity. No Cyanosis, Clubbing or edema, No new Rash or bruise  Data Review   CBC w Diff: Lab Results  Component Value Date   WBC 8.2 10/28/2013   HGB 14.1 10/28/2013   HCT 41.3 10/28/2013   PLT 210 10/28/2013   LYMPHOPCT 18 10/27/2013   MONOPCT 5 10/27/2013   EOSPCT 1 10/27/2013   BASOPCT 0 10/27/2013    CMP: Lab Results  Component Value Date   NA 141 10/28/2013   K 3.6* 10/28/2013   CL 102 10/28/2013   CO2 26 10/28/2013   BUN 5* 10/28/2013   CREATININE 1.03 10/28/2013   PROT 5.9* 10/28/2013   ALBUMIN 3.1* 10/28/2013   BILITOT 0.9  10/28/2013   ALKPHOS 72 10/28/2013   AST 51* 10/28/2013   ALT 80* 10/28/2013  .   Total Time in preparing paper work, data evaluation and todays exam - 35 minutes  Thurnell Lose M.D on 10/28/2013 at 8:55 AM  [facility] Office  [phone]

## 2013-10-28 NOTE — Progress Notes (Signed)
Pt refused full liquid. Resting quietly with no complaints at present. Dr. Candiss Norse aware via phone of pt request and order obtained to change diet to regular.

## 2013-10-28 NOTE — Discharge Instructions (Signed)
Follow with Primary MD JEGEDE, OLUGBEMIGA, MD in 7 days   Get CBC, CMP, checked 7 days by Primary MD and again as instructed by your Primary MD.     Activity: As tolerated with Full fall precautions use walker/cane & assistance as needed   Disposition Home     Diet: Heart Healthy     For Heart failure patients - Check your Weight same time everyday, if you gain over 2 pounds, or you develop in leg swelling, experience more shortness of breath or chest pain, call your Primary MD immediately. Follow Cardiac Low Salt Diet and 1.8 lit/day fluid restriction.   On your next visit with her primary care physician please Get Medicines reviewed and adjusted.  Please request your Prim.MD to go over all Hospital Tests and Procedure/Radiological results at the follow up, please get all Hospital records sent to your Prim MD by signing hospital release before you go home.   If you experience worsening of your admission symptoms, develop shortness of breath, life threatening emergency, suicidal or homicidal thoughts you must seek medical attention immediately by calling 911 or calling your MD immediately  if symptoms less severe.  You Must read complete instructions/literature along with all the possible adverse reactions/side effects for all the Medicines you take and that have been prescribed to you. Take any new Medicines after you have completely understood and accpet all the possible adverse reactions/side effects.   Do not drive and provide baby sitting services if your were admitted for syncope or siezures until you have seen by Primary MD or a Neurologist and advised to do so again.  Do not drive when taking Pain medications.    Do not take more than prescribed Pain, Sleep and Anxiety Medications  Special Instructions: If you have smoked or chewed Tobacco  in the last 2 yrs please stop smoking, stop any regular Alcohol  and or any Recreational drug use.  Wear Seat belts while  driving.   Please note  You were cared for by a hospitalist during your hospital stay. If you have any questions about your discharge medications or the care you received while you were in the hospital after you are discharged, you can call the unit and asked to speak with the hospitalist on call if the hospitalist that took care of you is not available. Once you are discharged, your primary care physician will handle any further medical issues. Please note that NO REFILLS for any discharge medications will be authorized once you are discharged, as it is imperative that you return to your primary care physician (or establish a relationship with a primary care physician if you do not have one) for your aftercare needs so that they can reassess your need for medications and monitor your lab values.

## 2013-10-31 ENCOUNTER — Telehealth: Payer: Self-pay | Admitting: Internal Medicine

## 2013-10-31 ENCOUNTER — Encounter (HOSPITAL_COMMUNITY): Payer: Self-pay | Admitting: Emergency Medicine

## 2013-10-31 ENCOUNTER — Emergency Department (HOSPITAL_COMMUNITY)
Admission: EM | Admit: 2013-10-31 | Discharge: 2013-11-01 | Disposition: A | Discharge status: Home / Self Care | Payer: No Typology Code available for payment source | Attending: Emergency Medicine | Admitting: Emergency Medicine

## 2013-10-31 DIAGNOSIS — K219 Gastro-esophageal reflux disease without esophagitis: Secondary | ICD-10-CM | POA: Insufficient documentation

## 2013-10-31 DIAGNOSIS — Z792 Long term (current) use of antibiotics: Secondary | ICD-10-CM | POA: Insufficient documentation

## 2013-10-31 DIAGNOSIS — Z79899 Other long term (current) drug therapy: Secondary | ICD-10-CM | POA: Insufficient documentation

## 2013-10-31 DIAGNOSIS — R748 Abnormal levels of other serum enzymes: Secondary | ICD-10-CM | POA: Insufficient documentation

## 2013-10-31 DIAGNOSIS — Z8711 Personal history of peptic ulcer disease: Secondary | ICD-10-CM | POA: Insufficient documentation

## 2013-10-31 DIAGNOSIS — R1013 Epigastric pain: Secondary | ICD-10-CM | POA: Insufficient documentation

## 2013-10-31 DIAGNOSIS — I10 Essential (primary) hypertension: Secondary | ICD-10-CM | POA: Insufficient documentation

## 2013-10-31 DIAGNOSIS — R109 Unspecified abdominal pain: Secondary | ICD-10-CM

## 2013-10-31 DIAGNOSIS — E785 Hyperlipidemia, unspecified: Secondary | ICD-10-CM | POA: Insufficient documentation

## 2013-10-31 DIAGNOSIS — F17200 Nicotine dependence, unspecified, uncomplicated: Secondary | ICD-10-CM | POA: Insufficient documentation

## 2013-10-31 DIAGNOSIS — R1011 Right upper quadrant pain: Secondary | ICD-10-CM | POA: Insufficient documentation

## 2013-10-31 DIAGNOSIS — Z8659 Personal history of other mental and behavioral disorders: Secondary | ICD-10-CM | POA: Insufficient documentation

## 2013-10-31 LAB — COMPREHENSIVE METABOLIC PANEL
ALBUMIN: 4.3 g/dL (ref 3.5–5.2)
ALT: 116 U/L — ABNORMAL HIGH (ref 0–53)
AST: 96 U/L — ABNORMAL HIGH (ref 0–37)
Alkaline Phosphatase: 91 U/L (ref 39–117)
BILIRUBIN TOTAL: 0.7 mg/dL (ref 0.3–1.2)
BUN: 8 mg/dL (ref 6–23)
CALCIUM: 9.7 mg/dL (ref 8.4–10.5)
CHLORIDE: 102 meq/L (ref 96–112)
CO2: 23 mEq/L (ref 19–32)
CREATININE: 0.85 mg/dL (ref 0.50–1.35)
GFR calc non Af Amer: >90 mL/min (ref >90)
Glucose, Bld: 106 mg/dL — ABNORMAL HIGH (ref 70–99)
Potassium: 4.3 mEq/L (ref 3.7–5.3)
Sodium: 142 mEq/L (ref 137–147)
Total Protein: 8.1 g/dL (ref 6.0–8.3)

## 2013-10-31 LAB — URINALYSIS, ROUTINE W REFLEX MICROSCOPIC
GLUCOSE, UA: NEGATIVE mg/dL
Hgb urine dipstick: NEGATIVE
Ketones, ur: 15 mg/dL — AB
Nitrite: NEGATIVE
Protein, ur: 30 mg/dL — AB
SPECIFIC GRAVITY, URINE: 1.024 (ref 1.005–1.030)
UROBILINOGEN UA: 1 mg/dL (ref 0.0–1.0)
pH: 7 (ref 5.0–8.0)

## 2013-10-31 LAB — CBC WITH DIFFERENTIAL/PLATELET
BASOS PCT: 1 % (ref 0–1)
Basophils Absolute: 0.1 10*3/uL (ref 0.0–0.1)
EOS PCT: 2 % (ref 0–5)
Eosinophils Absolute: 0.2 10*3/uL (ref 0.0–0.7)
HEMATOCRIT: 50.7 % (ref 39.0–52.0)
HEMOGLOBIN: 18.3 g/dL — AB (ref 13.0–17.0)
Lymphocytes Relative: 19 % (ref 12–46)
Lymphs Abs: 1.7 10*3/uL (ref 0.7–4.0)
MCH: 35 pg — ABNORMAL HIGH (ref 26.0–34.0)
MCHC: 36.1 g/dL — AB (ref 30.0–36.0)
MCV: 96.9 fL (ref 78.0–100.0)
MONO ABS: 0.3 10*3/uL (ref 0.1–1.0)
MONOS PCT: 3 % (ref 3–12)
Neutro Abs: 6.8 10*3/uL (ref 1.7–7.7)
Neutrophils Relative %: 75 % (ref 43–77)
Platelets: 230 10*3/uL (ref 150–400)
RBC: 5.23 MIL/uL (ref 4.22–5.81)
RDW: 15.4 % (ref 11.5–15.5)
WBC: 9.1 10*3/uL (ref 4.0–10.5)

## 2013-10-31 LAB — LIPASE, BLOOD: Lipase: 16 U/L (ref 11–59)

## 2013-10-31 LAB — URINE MICROSCOPIC-ADD ON

## 2013-10-31 LAB — RAPID URINE DRUG SCREEN, HOSP PERFORMED
Amphetamines: NOT DETECTED
Barbiturates: NOT DETECTED
Benzodiazepines: NOT DETECTED
COCAINE: NOT DETECTED
Opiates: NOT DETECTED
TETRAHYDROCANNABINOL: NOT DETECTED

## 2013-10-31 MED ORDER — HYDROMORPHONE HCL PF 1 MG/ML IJ SOLN
1.0000 mg | Freq: Once | INTRAMUSCULAR | Status: AC
  Administered 2013-10-31: 1 mg via INTRAVENOUS
  Filled 2013-10-31: qty 1

## 2013-10-31 MED ORDER — OXYCODONE HCL 5 MG PO TABS: 5.0000 mg | ORAL_TABLET | Freq: Four times a day (QID) | ORAL | Status: DC | PRN

## 2013-10-31 MED ORDER — METOCLOPRAMIDE HCL 5 MG/ML IJ SOLN
10.0000 mg | Freq: Once | INTRAMUSCULAR | Status: AC
  Administered 2013-10-31: 10 mg via INTRAVENOUS
  Filled 2013-10-31: qty 2

## 2013-10-31 MED ORDER — SODIUM CHLORIDE 0.9 % IV BOLUS (SEPSIS)
1000.0000 mL | Freq: Once | INTRAVENOUS | Status: AC
  Administered 2013-10-31: 1000 mL via INTRAVENOUS

## 2013-10-31 MED ORDER — SODIUM CHLORIDE 0.9 % IV SOLN
INTRAVENOUS | Status: DC
Start: 2013-10-31 — End: 2013-11-01
  Administered 2013-10-31: 21:00:00 via INTRAVENOUS

## 2013-10-31 MED ORDER — KETOROLAC TROMETHAMINE 30 MG/ML IJ SOLN
30.0000 mg | Freq: Once | INTRAMUSCULAR | Status: AC
  Administered 2013-10-31: 30 mg via INTRAVENOUS
  Filled 2013-10-31: qty 1

## 2013-10-31 MED ORDER — KETOROLAC TROMETHAMINE 60 MG/2ML IM SOLN: 60.0000 mg | Freq: Once | INTRAMUSCULAR | Status: DC

## 2013-10-31 MED ORDER — PROMETHAZINE HCL 25 MG PO TABS: 25.0000 mg | ORAL_TABLET | Freq: Four times a day (QID) | ORAL | Status: DC | PRN

## 2013-10-31 NOTE — ED Notes (Signed)
Tran, PA at bedside.  

## 2013-10-31 NOTE — Telephone Encounter (Signed)
I have contacted the patient.  He reports he is at the ER now.  He could not wait until PA appt next week.  He will call back once he is seen at the hospital.

## 2013-10-31 NOTE — Discharge Instructions (Signed)
Please follow up closely with your doctor for further management of your abdominal pain.  Take pain medication as needed.  Take nausea medication as needed.  Return if your symptoms worsen or if you have other concerns.  Your liver enzyme is elevated and will need to be recheck by your doctor next week.  Abdominal Pain, Adult Many things can cause abdominal pain. Usually, abdominal pain is not caused by a disease and will improve without treatment. It can often be observed and treated at home. Your health care provider will do a physical exam and possibly order blood tests and X-rays to help determine the seriousness of your pain. However, in many cases, more time must pass before a clear cause of the pain can be found. Before that point, your health care provider may not know if you need more testing or further treatment. HOME CARE INSTRUCTIONS  Monitor your abdominal pain for any changes. The following actions may help to alleviate any discomfort you are experiencing:  Only take over-the-counter or prescription medicines as directed by your health care provider.  Do not take laxatives unless directed to do so by your health care provider.  Try a clear liquid diet (broth, tea, or water) as directed by your health care provider. Slowly move to a bland diet as tolerated. SEEK MEDICAL CARE IF:  You have unexplained abdominal pain.  You have abdominal pain associated with nausea or diarrhea.  You have pain when you urinate or have a bowel movement.  You experience abdominal pain that wakes you in the night.  You have abdominal pain that is worsened or improved by eating food.  You have abdominal pain that is worsened with eating fatty foods. SEEK IMMEDIATE MEDICAL CARE IF:   Your pain does not go away within 2 hours.  You have a fever.  You keep throwing up (vomiting).  Your pain is felt only in portions of the abdomen, such as the right side or the left lower portion of the  abdomen.  You pass bloody or black tarry stools. MAKE SURE YOU:  Understand these instructions.   Will watch your condition.   Will get help right away if you are not doing well or get worse.  Document Released: 05/12/2005 Document Revised: 05/23/2013 Document Reviewed: 04/11/2013 [facility] Patient Information 2014 [place].

## 2013-10-31 NOTE — ED Notes (Signed)
Unable to insert IV after 2 attempts will notify IV team; pt stated that he is a hard stick and has PICC lines in the past.

## 2013-10-31 NOTE — ED Notes (Signed)
Pt complaining of RUQ pain with N,V since Sunday. sts was seen at [facility]. sts has been told pancreatitis and liver issues.

## 2013-10-31 NOTE — ED Provider Notes (Signed)
CSN: [id number]     Arrival date & time 10/31/13  1302 History   First MD Initiated Contact with Patient 10/31/13 1815     Chief Complaint  Patient presents with  . Abdominal Pain     (Consider location/radiation/quality/duration/timing/severity/associated sxs/prior Treatment) HPI  28 year old male with history of recurrent dermatitis, followed by Dr. 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 Patient reports recurrent epigastric and right upper quadrant abdominal pain for the past several months. Pain is described as a stabbing and twisting sensation, worsening with vomiting, and sometimes with eating.  Patient was seen at [facility] long ER 5 days ago for the same complaint and was admitted to the hospital which he stay for 1 day. At that time workup was unremarkable including normal abdominal ultrasound, questionable urinary tract infection improved with Rocephin, normal lipase and normal liver enzyme.  He was discharge 3 days ago since discharge he continues to endorse abdominal pain with associate nausea vomiting diarrhea. States he has roughly 10 bouts of nonbloody nonbilious vomit and 5-7 bouts of nonbloody non-mucousy diarrhea per day. He feels dehydrated, and cannot tolerate anything by mouth. Continues to endorse abdominal pain. Denies fever, chills, headache, chest pain, shortness of breath, productive cough, back pain, dysuria. Denies any urinary discomfort, denies any rec drug use, or alcohol use.      Past Medical History  Diagnosis Date  . Hypertension   . Vertigo   . Pancreatitis, acute 08/14/2013  . Ulcer     gastric uler  . GERD (gastroesophageal reflux disease)   . Hyperlipidemia     no meds  . Substance abuse     tx of methadon  . Anxiety     panic attack while having a MRI   Past  Surgical History  Procedure Laterality Date  . Appendectomy     Family History  Problem Relation Age of Onset  . High blood pressure Mother   . Colon polyps Mother   . High blood pressure Father   . Migraines Paternal Grandmother   . Colon cancer Neg Hx   . Esophageal cancer Neg Hx   . Prostate cancer Neg Hx   . Pancreatic cancer Neg Hx   . Rectal cancer Neg Hx   . Stomach cancer Neg Hx    History  Substance Use Topics  . Smoking status: Current Every Day Smoker -- 1.00 packs/day for .5 years    Types: Cigarettes  . Smokeless tobacco: Never Used  . Alcohol Use: No    Review of Systems  All other systems reviewed and are negative.      Allergies  Zofran  Home Medications   Current Outpatient Rx  Name  Route  Sig  Dispense  Refill  . acetaminophen (TYLENOL) 500 MG tablet   Oral   Take 1 tablet (500 mg total) by mouth every 8 (eight) hours as needed for headache.   30 tablet   0   . bismuth subsalicylate (PEPTO BISMOL) 262 MG/15ML suspension   Oral   Take 30 mLs by mouth every 6 (six) hours as needed for indigestion.   360 mL   0   . cefUROXime (CEFTIN) 500 MG tablet   Oral   Take 1 tablet (500 mg total) by mouth 2 (two) times daily with a meal.   18 tablet   0   .  oxyCODONE (OXY IR/ROXICODONE) 5 MG immediate release tablet   Oral   Take 1 tablet (5 mg total) by mouth every 6 (six) hours as needed for severe pain.   15 tablet   0   . pantoprazole (PROTONIX) 40 MG tablet   Oral   Take 1 tablet (40 mg total) by mouth 2 (two) times daily.   60 tablet   11   . sucralfate (CARAFATE) 1 G tablet   Oral   Take 1 tablet (1 g total) by mouth 4 (four) times daily -  with meals and at bedtime.   90 tablet   1    BP 140/57  Pulse 74  Temp(Src) 97.3 F (36.3 C) (Oral)  Resp 20  Ht 6' (1.829 m)  SpO2 98% Physical Exam  Nursing note and vitals reviewed. Constitutional: He appears well-developed and well-nourished. No distress.  Awake, alert, nontoxic  appearance  HENT:  Head: Atraumatic.  Mouth/Throat: Oropharynx is clear and moist.  Eyes: Conjunctivae are normal. Right eye exhibits no discharge. Left eye exhibits no discharge.  Neck: Normal range of motion. Neck supple.  Cardiovascular: Normal rate and regular rhythm.   Pulmonary/Chest: Effort normal. No respiratory distress. He exhibits no tenderness.  Abdominal: Soft. There is tenderness (Abdomen is soft, generally diffuse tenderness most significant to epigastric and right upper quadrant with positive Murphy's sign. No guarding or rebound tenderness.). There is no rebound.  Decreased bowel sounds  Musculoskeletal: He exhibits no tenderness.  ROM appears intact, no obvious focal weakness  Neurological: He is alert.  Skin: Skin is warm and dry. No rash noted.  Psychiatric: He has a normal mood and affect.    ED Course  Procedures (including critical care time)  6:49 PM Patient with recurrent abdominal pain and associate nausea vomiting diarrhea. History of polysubstance abuse I suspect this is likely related to possible drug withdrawal. He is afebrile with stable normal vital sign. He has normal abdominal ultrasound done less than a week ago. I do not suspect this is a biliary problem. He also has prior appendectomy therefore do not suspect appendicitis. No cough, no shortness of breath, and no hypoxia therefore low suspicion for pneumonia. No active chest pain, lightheadedness, dizziness concerning for ACS.    7:57 PM Patient received Toradol however minimal improvement, we'll give Dilaudid and will continue to monitor. Care discussed with attending.  10:41 PM Continues to endorse abd pain, however nausea has improved.  Pt does not feel comfortable going home.    11:58 PM Pt able to tolerates PO.  Pain improves.  Recommend close f/u with pcp for further management.  i agree to provide a short course of pain medication and antinausea medication.  Return precaution discussed.  Care  discussed with dr. Reather Converse.  Labs Review Labs Reviewed  CBC WITH DIFFERENTIAL - Abnormal; Notable for the following:    Hemoglobin 18.3 (*)    MCH 35.0 (*)    MCHC 36.1 (*)    All other components within normal limits  COMPREHENSIVE METABOLIC PANEL - Abnormal; Notable for the following:    Glucose, Bld 106 (*)    AST 96 (*)    ALT 116 (*)    All other components within normal limits  URINALYSIS, ROUTINE W REFLEX MICROSCOPIC - Abnormal; Notable for the following:    Color, Urine AMBER (*)    APPearance HAZY (*)    Bilirubin Urine SMALL (*)    Ketones, ur 15 (*)    Protein, ur 30 (*)  Leukocytes, UA SMALL (*)    All other components within normal limits  LIPASE, BLOOD  URINE RAPID DRUG SCREEN (HOSP PERFORMED)  URINE MICROSCOPIC-ADD ON   Imaging Review No results found.   EKG Interpretation None      MDM   Final diagnoses:  Abdominal pain  Elevated liver enzymes    BP 127/82  Pulse 59  Temp(Src) 98.1 F (36.7 C) (Oral)  Resp 23  Ht 6' (1.829 m)  SpO2 98%     Domenic Moras, PA-C 11/01/13 0000

## 2013-11-01 NOTE — ED Provider Notes (Signed)
Medical screening examination/treatment/procedure(s) were conducted as a shared visit with non-physician practitioner(s) or resident and myself. I personally evaluated the patient during the encounter and agree with the findings and plan unless otherwise indicated.  I have personally reviewed any xrays and/ or EKG's with the provider and I agree with interpretation.  Pancreatitis, substance abuse hx. Recurrent epig pain with n/v, similar to previous. Pt frustrated as no known cause. Denies etoh or hepatitis hx. Exam central and epig tender mild, well appearing, mild dry mm. Pt recent had [place] abdo and seen at [facility]. Plan for pain meds, IV fluids. Labs unremarkable. No concern for acute abdo process at this time.Pt improved in ED, close outpt fup.  Difficult IV  Emergency Ultrasound Study:  Angiocath insertion Performed by: Mariea Clonts  Consent: Verbal consent obtained. Risks and benefits: risks, benefits and alternatives were discussed Immediately prior to procedure the correct patient, procedure, equipment, support staff and site/side marked as needed.  Indication: difficult IV access  Preparation: Patient was prepped and draped in the usual sterile fashion.  Vein Location: right AC vein was visualized during assessment for potential access sites and was found to be patent/ easily compressed with linear ultrasound. The needle was visualized with real-time ultrasound and guided into the vein.  Gauge: 20 g  Image saved and stored.  Normal blood return. Patient tolerance: Patient tolerated the procedure well with no immediate complications. Abdominal pain, vomiting, dehydration   Mariea Clonts, MD 11/01/13 [phone]

## 2013-11-06 ENCOUNTER — Ambulatory Visit (INDEPENDENT_AMBULATORY_CARE_PROVIDER_SITE_OTHER): Payer: No Typology Code available for payment source | Admitting: Nurse Practitioner

## 2013-11-06 ENCOUNTER — Encounter: Payer: Self-pay | Admitting: Nurse Practitioner

## 2013-11-06 ENCOUNTER — Emergency Department (HOSPITAL_COMMUNITY)
Admission: EM | Admit: 2013-11-06 | Discharge: 2013-11-06 | Disposition: A | Discharge status: Home / Self Care | Payer: No Typology Code available for payment source | Attending: Emergency Medicine | Admitting: Emergency Medicine

## 2013-11-06 ENCOUNTER — Encounter (HOSPITAL_COMMUNITY): Payer: Self-pay | Admitting: Emergency Medicine

## 2013-11-06 VITALS — BP 128/88 | HR 72 | Ht 72.0 in | Wt 227.0 lb

## 2013-11-06 DIAGNOSIS — R112 Nausea with vomiting, unspecified: Secondary | ICD-10-CM

## 2013-11-06 DIAGNOSIS — Z79899 Other long term (current) drug therapy: Secondary | ICD-10-CM | POA: Insufficient documentation

## 2013-11-06 DIAGNOSIS — I10 Essential (primary) hypertension: Secondary | ICD-10-CM | POA: Insufficient documentation

## 2013-11-06 DIAGNOSIS — R109 Unspecified abdominal pain: Secondary | ICD-10-CM

## 2013-11-06 DIAGNOSIS — Z9089 Acquired absence of other organs: Secondary | ICD-10-CM | POA: Insufficient documentation

## 2013-11-06 DIAGNOSIS — R1011 Right upper quadrant pain: Secondary | ICD-10-CM

## 2013-11-06 DIAGNOSIS — K219 Gastro-esophageal reflux disease without esophagitis: Secondary | ICD-10-CM | POA: Insufficient documentation

## 2013-11-06 DIAGNOSIS — Z8639 Personal history of other endocrine, nutritional and metabolic disease: Secondary | ICD-10-CM | POA: Insufficient documentation

## 2013-11-06 DIAGNOSIS — Z8659 Personal history of other mental and behavioral disorders: Secondary | ICD-10-CM | POA: Insufficient documentation

## 2013-11-06 DIAGNOSIS — R000 Tachycardia, unspecified: Secondary | ICD-10-CM | POA: Insufficient documentation

## 2013-11-06 DIAGNOSIS — G8929 Other chronic pain: Secondary | ICD-10-CM

## 2013-11-06 DIAGNOSIS — F17200 Nicotine dependence, unspecified, uncomplicated: Secondary | ICD-10-CM | POA: Insufficient documentation

## 2013-11-06 DIAGNOSIS — R1013 Epigastric pain: Secondary | ICD-10-CM | POA: Insufficient documentation

## 2013-11-06 DIAGNOSIS — R197 Diarrhea, unspecified: Secondary | ICD-10-CM | POA: Insufficient documentation

## 2013-11-06 DIAGNOSIS — R1012 Left upper quadrant pain: Secondary | ICD-10-CM | POA: Insufficient documentation

## 2013-11-06 DIAGNOSIS — Z862 Personal history of diseases of the blood and blood-forming organs and certain disorders involving the immune mechanism: Secondary | ICD-10-CM | POA: Insufficient documentation

## 2013-11-06 LAB — LIPASE, BLOOD: LIPASE: 20 U/L (ref 11–59)

## 2013-11-06 LAB — URINALYSIS, ROUTINE W REFLEX MICROSCOPIC
Glucose, UA: NEGATIVE mg/dL
Hgb urine dipstick: NEGATIVE
KETONES UR: NEGATIVE mg/dL
LEUKOCYTES UA: NEGATIVE
NITRITE: NEGATIVE
Protein, ur: 30 mg/dL — AB
Specific Gravity, Urine: 1.023 (ref 1.005–1.030)
UROBILINOGEN UA: 1 mg/dL (ref 0.0–1.0)
pH: 5.5 (ref 5.0–8.0)

## 2013-11-06 LAB — CBC WITH DIFFERENTIAL/PLATELET
BASOS PCT: 1 % (ref 0–1)
Basophils Absolute: 0.1 10*3/uL (ref 0.0–0.1)
Eosinophils Absolute: 0.2 10*3/uL (ref 0.0–0.7)
Eosinophils Relative: 2 % (ref 0–5)
HEMATOCRIT: 49.5 % (ref 39.0–52.0)
HEMOGLOBIN: 17.5 g/dL — AB (ref 13.0–17.0)
LYMPHS ABS: 2.2 10*3/uL (ref 0.7–4.0)
Lymphocytes Relative: 28 % (ref 12–46)
MCH: 34 pg (ref 26.0–34.0)
MCHC: 35.4 g/dL (ref 30.0–36.0)
MCV: 96.1 fL (ref 78.0–100.0)
MONO ABS: 0.4 10*3/uL (ref 0.1–1.0)
MONOS PCT: 5 % (ref 3–12)
NEUTROS PCT: 63 % (ref 43–77)
Neutro Abs: 4.9 10*3/uL (ref 1.7–7.7)
Platelets: 342 10*3/uL (ref 150–400)
RBC: 5.15 MIL/uL (ref 4.22–5.81)
RDW: 15.2 % (ref 11.5–15.5)
WBC: 7.8 10*3/uL (ref 4.0–10.5)

## 2013-11-06 LAB — COMPREHENSIVE METABOLIC PANEL
ALBUMIN: 4.2 g/dL (ref 3.5–5.2)
ALK PHOS: 98 U/L (ref 39–117)
ALT: 88 U/L — ABNORMAL HIGH (ref 0–53)
AST: 61 U/L — ABNORMAL HIGH (ref 0–37)
BUN: 8 mg/dL (ref 6–23)
CO2: 18 mEq/L — ABNORMAL LOW (ref 19–32)
CREATININE: 0.93 mg/dL (ref 0.50–1.35)
Calcium: 9.4 mg/dL (ref 8.4–10.5)
Chloride: 100 mEq/L (ref 96–112)
GFR calc non Af Amer: >90 mL/min (ref >90)
GLUCOSE: 136 mg/dL — AB (ref 70–99)
POTASSIUM: 4.4 meq/L (ref 3.7–5.3)
Sodium: 142 mEq/L (ref 137–147)
Total Bilirubin: 0.7 mg/dL (ref 0.3–1.2)
Total Protein: 8.2 g/dL (ref 6.0–8.3)

## 2013-11-06 LAB — URINE MICROSCOPIC-ADD ON

## 2013-11-06 MED ORDER — HYDROCODONE-ACETAMINOPHEN 5-325 MG PO TABS: ORAL_TABLET | ORAL | Status: DC

## 2013-11-06 MED ORDER — SODIUM CHLORIDE 0.9 % IV SOLN
Freq: Once | INTRAVENOUS | Status: AC
  Administered 2013-11-06: 06:00:00 via INTRAVENOUS

## 2013-11-06 MED ORDER — METOCLOPRAMIDE HCL 5 MG/ML IJ SOLN
10.0000 mg | INTRAMUSCULAR | Status: AC
  Administered 2013-11-06: 10 mg via INTRAVENOUS
  Filled 2013-11-06: qty 2

## 2013-11-06 MED ORDER — OXYCODONE HCL 5 MG PO TABS
10.0000 mg | ORAL_TABLET | Freq: Once | ORAL | Status: AC
  Administered 2013-11-06: 10 mg via ORAL
  Filled 2013-11-06: qty 2

## 2013-11-06 MED ORDER — MORPHINE SULFATE 4 MG/ML IJ SOLN
4.0000 mg | Freq: Once | INTRAMUSCULAR | Status: AC
  Administered 2013-11-06: 4 mg via INTRAVENOUS
  Filled 2013-11-06: qty 1

## 2013-11-06 NOTE — ED Provider Notes (Signed)
CSN: [id number]     Arrival date & time 11/06/13  1962 History   First MD Initiated Contact with Patient 11/06/13 [phone]     Chief Complaint  Patient presents with  . Abdominal Pain  . Emesis     (Consider location/radiation/quality/duration/timing/severity/associated sxs/prior Treatment) Patient is a 28 y.o. male presenting with abdominal pain and vomiting. The history is provided by the patient and medical records.  Abdominal Pain Associated symptoms: diarrhea, nausea and vomiting   Emesis Associated symptoms: abdominal pain and diarrhea    This is a 28 year old male with past history significant for hypertension, vertigo, gastric ulcers, GERD, hyperlipidemia, pancreatitis, presenting to the ED for abdominal pain, nausea, vomiting, and diarrhea for the past 4 days.  Stool and emesis are all non-bloody. States he has not been able to tolerate anything PO since sx began.  Patient states every few weeks he gets "flares" of the symptoms without known cause. He is currently followed by GI, Dr. Hilarie Fredrickson.  Admits he has had prior MRI, CT abd/pelvis, endoscopy, etc, without explanation of his sx.  States sx today are typical for him during his "flares".  Denies fever or chills.  No recent sick contacts.  Prior abdominal surgeries include appendectomy.  Pt hypertensive and tachycardic on arrival, actively vomiting.  Past Medical History  Diagnosis Date  . Hypertension   . Vertigo   . Pancreatitis, acute 08/14/2013  . Ulcer     gastric uler  . GERD (gastroesophageal reflux disease)   . Hyperlipidemia     no meds  . Substance abuse     tx of methadon  . Anxiety     panic attack while having a MRI   Past Surgical History  Procedure Laterality Date  . Appendectomy     Family History  Problem Relation Age of Onset  . High blood pressure Mother   . Colon polyps Mother   . High blood pressure Father   . Migraines Paternal Grandmother   . Colon cancer Neg Hx   . Esophageal cancer Neg Hx     . Prostate cancer Neg Hx   . Pancreatic cancer Neg Hx   . Rectal cancer Neg Hx   . Stomach cancer Neg Hx    History  Substance Use Topics  . Smoking status: Current Every Day Smoker -- 1.00 packs/day for .5 years    Types: Cigarettes  . Smokeless tobacco: Never Used  . Alcohol Use: No    Review of Systems  Gastrointestinal: Positive for nausea, vomiting, abdominal pain and diarrhea.  All other systems reviewed and are negative.      Allergies  Zofran  Home Medications   Current Outpatient Rx  Name  Route  Sig  Dispense  Refill  . oxyCODONE (OXY IR/ROXICODONE) 5 MG immediate release tablet   Oral   Take 1 tablet (5 mg total) by mouth every 6 (six) hours as needed for severe pain.   10 tablet   0   . pantoprazole (PROTONIX) 40 MG tablet   Oral   Take 1 tablet (40 mg total) by mouth 2 (two) times daily.   60 tablet   11   . sucralfate (CARAFATE) 1 G tablet   Oral   Take 1 tablet (1 g total) by mouth 4 (four) times daily -  with meals and at bedtime.   90 tablet   1   . acetaminophen (TYLENOL) 500 MG tablet   Oral   Take 1 tablet (500 mg total) by  mouth every 8 (eight) hours as needed for headache.   30 tablet   0   . promethazine (PHENERGAN) 25 MG tablet   Oral   Take 1 tablet (25 mg total) by mouth every 6 (six) hours as needed for nausea.   10 tablet   0     BP 149/120  Pulse 126  Temp(Src) 97.8 F (36.6 C) (Oral)  Resp 20  SpO2 98%  Physical Exam  Nursing note and vitals reviewed. Constitutional: He is oriented to person, place, and time. He appears well-developed and well-nourished. No distress.  Appears uncomfortable, actively vomiting  HENT:  Head: Normocephalic and atraumatic.  Mouth/Throat: Uvula is midline and oropharynx is clear and moist. Mucous membranes are dry.  Dry mucous membranes  Eyes: Conjunctivae and EOM are normal. Pupils are equal, round, and reactive to light.  Neck: Normal range of motion. Neck supple.   Cardiovascular: Regular rhythm and normal heart sounds.  Tachycardia present.   Pulmonary/Chest: Effort normal and breath sounds normal. No respiratory distress. He has no wheezes.  Abdominal: Soft. Bowel sounds are normal. There is tenderness in the epigastric area and left upper quadrant. There is no guarding, no CVA tenderness, no tenderness at McBurney's point and negative Murphy's sign.    Abdomen soft, non-distended, TTP epigastrium and LUQ without rebound or guarding  Musculoskeletal: Normal range of motion.  Neurological: He is alert and oriented to person, place, and time.  Skin: Skin is warm and dry. He is not diaphoretic.  Psychiatric: He has a normal mood and affect.    ED Course  Procedures (including critical care time) Labs Review Labs Reviewed  CBC WITH DIFFERENTIAL - Abnormal; Notable for the following:    Hemoglobin 17.5 (*)    All other components within normal limits  COMPREHENSIVE METABOLIC PANEL - Abnormal; Notable for the following:    CO2 18 (*)    Glucose, Bld 136 (*)    AST 61 (*)    ALT 88 (*)    All other components within normal limits  URINALYSIS, ROUTINE W REFLEX MICROSCOPIC - Abnormal; Notable for the following:    Color, Urine AMBER (*)    APPearance TURBID (*)    Bilirubin Urine SMALL (*)    Protein, ur 30 (*)    All other components within normal limits  URINE MICROSCOPIC-ADD ON - Abnormal; Notable for the following:    Casts GRANULAR CAST (*)    All other components within normal limits  LIPASE, BLOOD   Imaging Review No results found.   EKG Interpretation None      MDM   Final diagnoses:  Abdominal pain  Nausea and vomiting   Pt tachycardiac and hypertensive on arrival, actively vomiting during initial assessment.  TTP along epigastrium and LUQ, possibly recurrent pancreatitis.  Will obtain CBC, CMP, lipase, and u/a.  Fluid bolus, pain meds, and anti-emetics given.  Will continue to monitor.  Labs appear baseline for pt when  compared with previous.  Pain is well controlled after her dose of morphine and roxicodone, pt now resting comfortably in bed.  Pt has remained afebrile and overall non-toxic appearing.  At this time, low suspicion for acute or surgical abdomen including, but not limited to, pancreatic abscess, SBO, bowel perforation, cholecystitis.  Pt has tolerated PO without recurrent vomiting.  Pt will FU with Dr. Hilarie Fredrickson this afternoon as previously scheduled.  VS stable at time of discharge. Filed Vitals:   11/06/13 0916  BP: 134/89  Pulse: 74  Temp: 98.1 F (36.7 C)  Resp: [address]     Larene Pickett, [place] 11/06/13 1503

## 2013-11-06 NOTE — ED Provider Notes (Signed)
Medical screening examination/treatment/procedure(s) were performed by non-physician practitioner and as supervising physician I was immediately available for consultation/collaboration.   EKG Interpretation None        Elyn Peers, MD 11/06/13 [phone]

## 2013-11-06 NOTE — ED Notes (Signed)
Pt states he has had episodes of abd pain with vomiting off and on for the past 11 mths  Pt states he has been scoped, had CTS, MRI, etc and they have not been able to find the cause  Pt states this time it started about 4 days ago and he has been unable to hold anything down  Pt is actively vomiting in triage

## 2013-11-06 NOTE — Progress Notes (Addendum)
     History of Present Illness:  Patient is a 28 year old male who we first met late December 2014 while hospitalized with nausea, vomiting and abdominal pain. 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  He complained of hematemesis but EGD was not done inpatient, hemoglobin remained stable around 14 . Patient was hospitalized again in February, same pain. Ultrasound negative.  MRCP negative. During that admission his transaminases were elevated in the low 200 range, they had significantly improved by discharge.  Patient was seen in the office for hospital followup mid February, complained of constant epigastric pain. EGD was done, unremarkable except for mild antral gastropathy. Biopsies were unremarkable.  Patient is back with persistent epigastric pain often leading to vomiting which exacerbates the pain. Laying on back makes pain worse as does eating. He was in the ED today. CBC and lipase were normal.  KUB negative He is taking protonix and carafate daily. Using phenergan suppositories. Strong [facility] of gallbladder disease.   Current Medications, Allergies, Past Medical History, Past Surgical History, Family History and Social History were reviewed in [employer] record.   Dg Abd Portable 1v  10/28/2013   CLINICAL DATA:  Abdominal pain  EXAM: PORTABLE ABDOMEN - 1 VIEW  COMPARISON:  None.  FINDINGS: Scattered large and small bowel gas is noted. No abnormal mass or abnormal calcifications are seen. No free air is noted. No bony abnormality is noted.  IMPRESSION: No acute abnormality seen.   Electronically Signed   By: Inez Catalina M.D.   On: 10/28/2013 07:39   Physical Exam: General: Pleasant, well developed , white male in no acute distress Head: Normocephalic and atraumatic Eyes:  sclerae anicteric, conjunctiva pink  Ears: Normal auditory acuity Lungs: Clear throughout to auscultation Heart: Regular rate and  rhythm Abdomen: Soft, non distended, non-tender. No masses, no hepatomegaly. Normal bowel sounds Musculoskeletal: Symmetrical with no gross deformities  Extremities: No edema  Neurological: Alert oriented x 4, grossly nonfocal Psychological:  Alert and cooperative. Normal mood and affect  Assessment and Recommendations:   28 year old male with chronic upper abdominal pain, nausea and vomiting. His extensive workup has been unremarkable. Negative evaluation in the emergency department today. Will obtain a HIDA scan to look for gallbladder disease. Patient is asking for something for pain. I offered Ultram but he has sent home and it does not work. Patient has a history of heroin addiction. I did explain that I was reluctant to provide him with any narcotics. At the end of our visit I did decide to give him #20 hydrocodone but cautioned that this could be a trigger for him. If HIDA scan is negative I don't know what we have left to offer Mr. Tsui  Bowel changes. He has had diarrhea since onset of abdominal pain.   Addendum: Reviewed and agree with management. No evidence of ongoing pancreatitis.  Agree with HIDA.  Await results. Pt knows we will not be a source for chronic pain medications in the future [name], MD

## 2013-11-06 NOTE — Patient Instructions (Signed)
We have given you a prescription for Vicodin ( Hydrocodone) to take to your pharmacy.   Take as directed.  We scheduled the Hidascan ( gallbladder function test) at [facility]. Go to patient registration inside front door of hospital. Arrive on 11-20-2013 at 7:15 am. Have nothing after midnight. Do not take the pain medication ( Vicodin) for 6 hour prior to the test.

## 2013-11-06 NOTE — Discharge Instructions (Signed)
Follow up with Dr. Hilarie Fredrickson this afternoon as previously scheduled. Return to the ED for new or worsening symptoms.

## 2013-11-07 ENCOUNTER — Encounter: Payer: Self-pay | Admitting: Nurse Practitioner

## 2013-11-12 ENCOUNTER — Ambulatory Visit: Payer: No Typology Code available for payment source | Attending: Internal Medicine | Admitting: Internal Medicine

## 2013-11-12 ENCOUNTER — Encounter: Payer: Self-pay | Admitting: Internal Medicine

## 2013-11-12 VITALS — BP 149/98 | HR 100 | Temp 98.6°F | Resp 16 | Ht 72.0 in | Wt 223.0 lb

## 2013-11-12 DIAGNOSIS — R748 Abnormal levels of other serum enzymes: Secondary | ICD-10-CM | POA: Insufficient documentation

## 2013-11-12 DIAGNOSIS — K859 Acute pancreatitis without necrosis or infection, unspecified: Secondary | ICD-10-CM | POA: Insufficient documentation

## 2013-11-12 DIAGNOSIS — R1013 Epigastric pain: Secondary | ICD-10-CM

## 2013-11-12 DIAGNOSIS — R319 Hematuria, unspecified: Secondary | ICD-10-CM

## 2013-11-12 MED ORDER — CIPROFLOXACIN HCL 500 MG PO TABS: 500.0000 mg | ORAL_TABLET | Freq: Two times a day (BID) | ORAL | Status: DC

## 2013-11-12 MED ORDER — PANTOPRAZOLE SODIUM 40 MG PO TBEC: 40.0000 mg | DELAYED_RELEASE_TABLET | Freq: Two times a day (BID) | ORAL | Status: AC

## 2013-11-12 MED ORDER — PROMETHAZINE HCL 25 MG PO TABS: 25.0000 mg | ORAL_TABLET | Freq: Four times a day (QID) | ORAL | Status: DC | PRN

## 2013-11-12 MED ORDER — SUCRALFATE 1 G PO TABS: 1.0000 g | ORAL_TABLET | Freq: Three times a day (TID) | ORAL | Status: DC

## 2013-11-12 NOTE — Progress Notes (Signed)
Patient ID: Richard House, male   DOB: [birth year], 28 y.o.   MRN: [id number]   CC:  HPI: 28 year old male who we first met late December 2014 while hospitalized with nausea, vomiting and abdominal pain. 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 He complained of hematemesis   hemoglobin remained stable around 14 . Patient was hospitalized again in February, same pain. Ultrasound negative. MRCP negative. During that admission his transaminases were elevated in the low 200 range, they had significantly improved by discharge. Patient was seen in the office for hospital followup mid February, complained of constant epigastric pain. EGD was done, unremarkable except for mild antral gastropathy. Biopsies were unremarkable.   Patient claims to have vomited this morning and complains of epigastric pain which is persistent. He has been taking hydrocodone which was prescribed by GI on 3/24.  CBC and lipase were normal. KUB negative He is taking protonix and carafate daily. Using phenergan suppositories. Gi scheduled him for a HIDA scan to look for gallbladder disease.   patient also complaining of hematuria that he noted 3 days ago. The patient has had multiple imaging studies that did not show any evidence of nephrolithiasis. He does complain of burning sensation and flank pain.   Allergies  Allergen Reactions  . Zofran [Ondansetron Hcl]     Migraines   Past Medical History  Diagnosis Date  . Hypertension   . Vertigo   . Pancreatitis, acute 08/14/2013  . Ulcer     gastric uler  . GERD (gastroesophageal reflux disease)   . Hyperlipidemia     no meds  . Substance abuse     tx of methadon  . Anxiety     panic attack while having a MRI   Current Outpatient Prescriptions on File Prior to Visit  Medication Sig Dispense Refill  . HYDROcodone-acetaminophen (NORCO/VICODIN) 5-325 MG per tablet Take 1 tab every 6 hours as needed for  abdominal pain.  20 tablet  0   No current facility-administered medications on file prior to visit.   Family History  Problem Relation Age of Onset  . High blood pressure Mother   . Colon polyps Mother   . High blood pressure Father   . Migraines Paternal Grandmother   . Colon cancer Neg Hx   . Esophageal cancer Neg Hx   . Prostate cancer Neg Hx   . Pancreatic cancer Neg Hx   . Rectal cancer Neg Hx   . Stomach cancer Neg Hx    History   Social History  . Marital Status: Single    Spouse Name: N/A    Number of Children: N/A  . Years of Education: N/A   Occupational History  . Unemployed    Social History Main Topics  . Smoking status: Current Every Day Smoker -- 1.00 packs/day for .5 years    Types: Cigarettes  . Smokeless tobacco: Never Used  . Alcohol Use: No  . Drug Use: Yes     Comment: hx of drug use and took methadon for 3 yrs but off of that now  . Sexual Activity: Not on file   Other Topics Concern  . Not on file   Social History Narrative   Care from [place] in June 2014   Unemployed currently and is staying with his uncle here in [place] here to get cleaned out from his drug issues   Smoker half pack per day since age [age]   Start  here in 3-1/2 years ago and was on methadone until 6 months ago and goes to see [facility] treatment center where he was weaned off of this on 12/24          Review of Systems  Constitutional: Negative for fever, chills, diaphoresis, activity change, appetite change and fatigue.  HENT: Negative for ear pain, nosebleeds, congestion, facial swelling, rhinorrhea, neck pain, neck stiffness and ear discharge.   Eyes: Negative for pain, discharge, redness, itching and visual disturbance.  Respiratory: Negative for cough, choking, chest tightness, shortness of breath, wheezing and stridor.   Cardiovascular: Negative for chest pain, palpitations and leg swelling.  Gastrointestinal: Negative for abdominal distention.   Genitourinary: Negative for dysuria, urgency, frequency, hematuria, flank pain, decreased urine volume, difficulty urinating and dyspareunia.  Musculoskeletal: Negative for back pain, joint swelling, arthralgias and gait problem.  Neurological: Negative for dizziness, tremors, seizures, syncope, facial asymmetry, speech difficulty, weakness, light-headedness, numbness and headaches.  Hematological: Negative for adenopathy. Does not bruise/bleed easily.  Psychiatric/Behavioral: Negative for hallucinations, behavioral problems, confusion, dysphoric mood, decreased concentration and agitation.    Objective:   Filed Vitals:   11/12/13 1504  BP: 149/98  Pulse: 100  Temp: 98.6 F (37 C)  Resp: 16    Physical Exam  Constitutional: Appears well-developed and well-nourished. No distress.  HENT: Normocephalic. External right and left ear normal. Oropharynx is clear and moist.  Eyes: Conjunctivae and EOM are normal. PERRLA, no scleral icterus.  Neck: Normal ROM. Neck supple. No JVD. No tracheal deviation. No thyromegaly.  CVS: RRR, S1/S2 +, no murmurs, no gallops, no carotid bruit.  Pulmonary: Effort and breath sounds normal, no stridor, rhonchi, wheezes, rales.  Abdominal: Soft. BS +,  no distension, tenderness, rebound or guarding.  Musculoskeletal: Normal range of motion. No edema and no tenderness.  Lymphadenopathy: No lymphadenopathy noted, cervical, inguinal. Neuro: Alert. Normal reflexes, muscle tone coordination. No cranial nerve deficit. Skin: Skin is warm and dry. No rash noted. Not diaphoretic. No erythema. No pallor.  Psychiatric: Normal mood and affect. Behavior, judgment, thought content normal.   Lab Results  Component Value Date   WBC 7.8 11/06/2013   HGB 17.5* 11/06/2013   HCT 49.5 11/06/2013   MCV 96.1 11/06/2013   PLT 342 11/06/2013   Lab Results  Component Value Date   CREATININE 0.93 11/06/2013   BUN 8 11/06/2013   NA 142 11/06/2013   K 4.4 11/06/2013   CL 100  11/06/2013   CO2 18* 11/06/2013    Lab Results  Component Value Date   HGBA1C 5.6 04/24/2013   Lipid Panel     Component Value Date/Time   CHOL 94 10/28/2013 0551   TRIG 217* 10/28/2013 0551   HDL 23* 10/28/2013 0551   CHOLHDL 4.1 10/28/2013 0551   VLDL 43* 10/28/2013 0551   LDLCALC 28 10/28/2013 0551       Assessment and plan:   Patient Active Problem List   Diagnosis Date Noted  . UTI (urinary tract infection) 10/27/2013  . 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 Elevated liver enzymes 10/04/2013  . Elevated LFTs 09/30/2013  . Dehydration 09/30/2013  . UTI (lower urinary tract infection) 09/24/2013  . Pancreatitis, acute, cryptogenic 09/24/2013  . Hypokalemia 08/20/2013  . Acute narcotic withdrawal 08/19/2013  . Pancreatitis, acute 08/14/2013  . Hepatic steatosis 08/14/2013  . Abdominal pain, other specified site 08/13/2013  . Abdominal pain 08/12/2013  . Gait difficulty 07/18/2013  . Nausea with vomiting 07/18/2013  . Essential hypertension, benign  04/27/2013  . 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 The patient verbalized understanding. The patient was told to call to get any lab results if not heard anything in the next week.

## 2013-11-12 NOTE — Progress Notes (Signed)
Pt here HFU- Pancreatitis s/p Endoscopy negative C/o freq nausea and vomiting. Last emesis 2 hrs ago Taking Carafate/Phenergan and Protonix Epigastric pain radiating to back intermit  Scheduled for Hiatal scan 11/20/13

## 2013-11-13 ENCOUNTER — Telehealth: Payer: Self-pay | Admitting: Internal Medicine

## 2013-11-13 ENCOUNTER — Telehealth: Payer: Self-pay | Admitting: *Deleted

## 2013-11-13 NOTE — Telephone Encounter (Signed)
Message copied by Tonette Bihari on Tue Nov 13, 2013  8:56 AM ------      Message from: Hulan Saas      Created: Mon Nov 12, 2013  1:35 PM       Justine Dines,      This patient was seen on 3/24. I think he was to schedule OV with Pyrtle in 4 weeks but I do not see it scheduled.      R ------

## 2013-11-13 NOTE — Telephone Encounter (Signed)
Pt has come in today to get the results of his hearing test done yesterday afternoon; please f/u with pt by phone [phone] (Cell) after 10:00am

## 2013-11-13 NOTE — Telephone Encounter (Signed)
Was able to get the patient this time on his number.  I advised him of his appointment with Dr. Hilarie Fredrickson on 12-04-2013 at 4:00 PM.  Also I reminded him of the [facility] bladder function test) scheduled for 11-20-2013 at [facility]. He is to arrive at 7:15 am.

## 2013-11-13 NOTE — Telephone Encounter (Signed)
Called the number on file and I keep getting a recording the number cannot be completed as dialed, etc.  I was try to locate the patient to advise him of his appt with Dr. Hilarie Fredrickson I made for 12-04-2013 at 4:00 PM .  Also to remind him of his Hida scan scheduled for 11-20-2013 at 7:30 am .

## 2013-11-16 ENCOUNTER — Emergency Department (HOSPITAL_COMMUNITY)
Admission: EM | Admit: 2013-11-16 | Discharge: 2013-11-16 | Disposition: A | Discharge status: Home / Self Care | Payer: No Typology Code available for payment source | Attending: Emergency Medicine | Admitting: Emergency Medicine

## 2013-11-16 ENCOUNTER — Encounter (HOSPITAL_COMMUNITY): Payer: Self-pay | Admitting: Emergency Medicine

## 2013-11-16 DIAGNOSIS — M549 Dorsalgia, unspecified: Secondary | ICD-10-CM | POA: Insufficient documentation

## 2013-11-16 DIAGNOSIS — Z862 Personal history of diseases of the blood and blood-forming organs and certain disorders involving the immune mechanism: Secondary | ICD-10-CM | POA: Insufficient documentation

## 2013-11-16 DIAGNOSIS — R748 Abnormal levels of other serum enzymes: Secondary | ICD-10-CM | POA: Insufficient documentation

## 2013-11-16 DIAGNOSIS — Z8639 Personal history of other endocrine, nutritional and metabolic disease: Secondary | ICD-10-CM | POA: Insufficient documentation

## 2013-11-16 DIAGNOSIS — R197 Diarrhea, unspecified: Secondary | ICD-10-CM | POA: Insufficient documentation

## 2013-11-16 DIAGNOSIS — R109 Unspecified abdominal pain: Secondary | ICD-10-CM | POA: Insufficient documentation

## 2013-11-16 DIAGNOSIS — F17200 Nicotine dependence, unspecified, uncomplicated: Secondary | ICD-10-CM | POA: Insufficient documentation

## 2013-11-16 DIAGNOSIS — K219 Gastro-esophageal reflux disease without esophagitis: Secondary | ICD-10-CM | POA: Insufficient documentation

## 2013-11-16 DIAGNOSIS — I10 Essential (primary) hypertension: Secondary | ICD-10-CM | POA: Insufficient documentation

## 2013-11-16 DIAGNOSIS — Z8659 Personal history of other mental and behavioral disorders: Secondary | ICD-10-CM | POA: Insufficient documentation

## 2013-11-16 DIAGNOSIS — Z79899 Other long term (current) drug therapy: Secondary | ICD-10-CM | POA: Insufficient documentation

## 2013-11-16 DIAGNOSIS — R112 Nausea with vomiting, unspecified: Secondary | ICD-10-CM | POA: Insufficient documentation

## 2013-11-16 LAB — CBC WITH DIFFERENTIAL/PLATELET
BASOS ABS: 0.1 10*3/uL (ref 0.0–0.1)
BASOS PCT: 1 % (ref 0–1)
Eosinophils Absolute: 0.1 10*3/uL (ref 0.0–0.7)
Eosinophils Relative: 2 % (ref 0–5)
HCT: 48.6 % (ref 39.0–52.0)
HEMOGLOBIN: 17.4 g/dL — AB (ref 13.0–17.0)
Lymphocytes Relative: 41 % (ref 12–46)
Lymphs Abs: 2.7 10*3/uL (ref 0.7–4.0)
MCH: 34.5 pg — ABNORMAL HIGH (ref 26.0–34.0)
MCHC: 35.8 g/dL (ref 30.0–36.0)
MCV: 96.4 fL (ref 78.0–100.0)
MONOS PCT: 6 % (ref 3–12)
Monocytes Absolute: 0.4 10*3/uL (ref 0.1–1.0)
NEUTROS ABS: 3.3 10*3/uL (ref 1.7–7.7)
Neutrophils Relative %: 50 % (ref 43–77)
Platelets: 381 10*3/uL (ref 150–400)
RBC: 5.04 MIL/uL (ref 4.22–5.81)
RDW: 14.9 % (ref 11.5–15.5)
WBC: 6.5 10*3/uL (ref 4.0–10.5)

## 2013-11-16 LAB — COMPREHENSIVE METABOLIC PANEL
ALBUMIN: 4.2 g/dL (ref 3.5–5.2)
ALK PHOS: 102 U/L (ref 39–117)
ALT: 94 U/L — ABNORMAL HIGH (ref 0–53)
AST: 57 U/L — AB (ref 0–37)
BUN: 8 mg/dL (ref 6–23)
CHLORIDE: 101 meq/L (ref 96–112)
CO2: 21 mEq/L (ref 19–32)
Calcium: 8.9 mg/dL (ref 8.4–10.5)
Creatinine, Ser: 0.94 mg/dL (ref 0.50–1.35)
GFR calc Af Amer: >90 mL/min (ref >90)
GFR calc non Af Amer: >90 mL/min (ref >90)
Glucose, Bld: 118 mg/dL — ABNORMAL HIGH (ref 70–99)
Potassium: 3.7 mEq/L (ref 3.7–5.3)
Sodium: 141 mEq/L (ref 137–147)
Total Bilirubin: 0.4 mg/dL (ref 0.3–1.2)
Total Protein: 7.7 g/dL (ref 6.0–8.3)

## 2013-11-16 LAB — URINALYSIS, ROUTINE W REFLEX MICROSCOPIC
BILIRUBIN URINE: NEGATIVE
GLUCOSE, UA: NEGATIVE mg/dL
Hgb urine dipstick: NEGATIVE
KETONES UR: NEGATIVE mg/dL
Leukocytes, UA: NEGATIVE
Nitrite: NEGATIVE
PH: 6 (ref 5.0–8.0)
Protein, ur: NEGATIVE mg/dL
Specific Gravity, Urine: 1.022 (ref 1.005–1.030)
Urobilinogen, UA: 0.2 mg/dL (ref 0.0–1.0)

## 2013-11-16 LAB — LIPASE, BLOOD: Lipase: 18 U/L (ref 11–59)

## 2013-11-16 MED ORDER — MORPHINE SULFATE 4 MG/ML IJ SOLN
4.0000 mg | Freq: Once | INTRAMUSCULAR | Status: AC
  Administered 2013-11-16: 4 mg via INTRAVENOUS
  Filled 2013-11-16: qty 1

## 2013-11-16 MED ORDER — OXYCODONE HCL 5 MG PO TABS
5.0000 mg | ORAL_TABLET | Freq: Once | ORAL | Status: AC
  Administered 2013-11-16: 5 mg via ORAL
  Filled 2013-11-16: qty 1

## 2013-11-16 MED ORDER — METOCLOPRAMIDE HCL 5 MG/ML IJ SOLN
10.0000 mg | Freq: Once | INTRAMUSCULAR | Status: AC
  Administered 2013-11-16: 10 mg via INTRAVENOUS
  Filled 2013-11-16: qty 2

## 2013-11-16 MED ORDER — METOCLOPRAMIDE HCL 5 MG/ML IJ SOLN
10.0000 mg | Freq: Once | INTRAMUSCULAR | Status: DC
  Filled 2013-11-16: qty 2

## 2013-11-16 MED ORDER — SODIUM CHLORIDE 0.9 % IV BOLUS (SEPSIS)
1000.0000 mL | Freq: Once | INTRAVENOUS | Status: AC
  Administered 2013-11-16: 1000 mL via INTRAVENOUS

## 2013-11-16 NOTE — ED Notes (Signed)
Pt presents with c/o abd pain and vomiting  Pt states he has had this for months and this episode started about 5 days ago  Pt states he has had EGD, CT scan, ultrasound, etc and is scheduled for a HIDA scan on the 7th  Pt states he has not been able to hold anything down for the past 3 days

## 2013-11-16 NOTE — ED Provider Notes (Signed)
CSN: [id number]     Arrival date & time 11/16/13  0704 History   First MD Initiated Contact with Patient 11/16/13 0719     Chief Complaint  Patient presents with  . Abdominal Pain  . Emesis    HPI  Richard House is a 28 y.o. male with a PMH of pancreatitis, gastric ulcer, GERD, substance abuse, HTN, hyperlipidemia, vertigo and anxiety who presents to the ED for evaluation of abdominal pain and emesis. History was provided by the patient. Patient states that he has had chronic unchanged epigastric abdominal pain for months with an exacerbation of pain about 5 days ago. Abdominal pain is located in the epigastric region with radiation towards his back. Pain is an intermittent stabbing pain. Eating sometimes improves and worsens pain. He has taken Carafate, Phenergan suppositories, Protonix and hydrocodone with no relief. He has not been able to keep anything down for two days. He has had multiple episodes of emesis. No hematemesis. He also has had watery diarrhea with no hematochezia. He states he things he is dehydrated. He has been admitted for this abdominal pain in the past. He has a follow-up appointment with his GI doctor Pyrtle on 12/04/13 and a HIDA scan on 11/20/13. He recently saw Dr. Allyson Sabal on 3/30 for hematuria and chronic abdominal pain. Denies any dysuria or penile discharge. States had a few drops of gross blood after urination during one episode (which resolved). Patient not prescribed antibiotics for UTI. Patient has been evaluated for abdominal pain with negative [place] and MRCP. Patient also had EGD which showed mild gastropathy with negative biopsies. Abdominal pain thought to be related to pancreatitis related to alcohol. Patient recently admitted for a similar complaint on 10/27/13 and discharged on 10/28/13. Patient denies any alcohol or substance abuse. Patient referred to pain management clinic. Previous abdominal surgeries include an appendectomy. Patient denies any fevers, chills, chest  pain, SOB, palpitations, headache, dizziness, lightheadedness, weakness.    Past Medical History  Diagnosis Date  . Hypertension   . Vertigo   . Pancreatitis, acute 08/14/2013  . Ulcer     gastric uler  . GERD (gastroesophageal reflux disease)   . Hyperlipidemia     no meds  . Substance abuse     tx of methadon  . Anxiety     panic attack while having a MRI   Past Surgical History  Procedure Laterality Date  . Appendectomy     Family History  Problem Relation Age of Onset  . High blood pressure Mother   . Colon polyps Mother   . High blood pressure Father   . Migraines Paternal Grandmother   . Colon cancer Neg Hx   . Esophageal cancer Neg Hx   . Prostate cancer Neg Hx   . Pancreatic cancer Neg Hx   . Rectal cancer Neg Hx   . Stomach cancer Neg Hx    History  Substance Use Topics  . Smoking status: Current Every Day Smoker -- 1.00 packs/day for .5 years    Types: Cigarettes  . Smokeless tobacco: Never Used  . Alcohol Use: No    Review of Systems  Constitutional: Negative for fever, chills, diaphoresis, activity change, appetite change and fatigue.  Respiratory: Negative for shortness of breath.   Cardiovascular: Negative for chest pain.  Gastrointestinal: Positive for nausea, vomiting, abdominal pain and diarrhea. Negative for constipation, blood in stool, anal bleeding and rectal pain.  Genitourinary: Positive for hematuria (resolved). Negative for dysuria, urgency, flank pain, penile swelling, scrotal  swelling, genital sores, penile pain and testicular pain.  Musculoskeletal: Positive for back pain (radiation). Negative for myalgias.  Skin: Negative for color change.  Neurological: Negative for dizziness, weakness, light-headedness, numbness and headaches.  Psychiatric/Behavioral: Negative for confusion.    Allergies  Zofran  Home Medications   Current Outpatient Rx  Name  Route  Sig  Dispense  Refill  . HYDROcodone-acetaminophen (NORCO/VICODIN) 5-325 MG  per tablet      Take 1 tab every 6 hours as needed for abdominal pain.   20 tablet   0   . loperamide (IMODIUM) 2 MG capsule   Oral   Take 6 mg by mouth as needed for diarrhea or loose stools.         . pantoprazole (PROTONIX) 40 MG tablet   Oral   Take 1 tablet (40 mg total) by mouth 2 (two) times daily.   60 tablet   11   . promethazine (PHENERGAN) 25 MG tablet   Oral   Take 1 tablet (25 mg total) by mouth every 6 (six) hours as needed for nausea.   60 tablet   2   . sucralfate (CARAFATE) 1 G tablet   Oral   Take 1 g by mouth 2 (two) times daily.          BP 153/71  Pulse 106  Temp(Src) 98.6 F (37 C) (Oral)  Resp 16  SpO2 98%  Filed Vitals:   11/16/13 0708 11/16/13 0714 11/16/13 0922 11/16/13 1203  BP: 153/71  119/72 155/99  Pulse: 106  87 65  Temp:  98.6 F (37 C)  98.4 F (36.9 C)  TempSrc:  Oral  Oral  Resp: 16  16 16   SpO2: 98%  99% 98%    Physical Exam  Nursing note and vitals reviewed. Constitutional: He is oriented to person, place, and time. He appears well-developed and well-nourished. No distress.  HENT:  Head: Normocephalic and atraumatic.  Right Ear: External ear normal.  Left Ear: External ear normal.  Dry mucus membranes  Eyes: Conjunctivae are normal. Right eye exhibits no discharge. Left eye exhibits no discharge.  Neck: Normal range of motion. Neck supple.  Cardiovascular: Normal rate, normal heart sounds and intact distal pulses.  Exam reveals no gallop and no friction rub.   No murmur heard. Pulmonary/Chest: Effort normal and breath sounds normal. No respiratory distress. He has no wheezes. He has no rales. He exhibits no tenderness.  Abdominal: Soft. Bowel sounds are normal. He exhibits no distension and no mass. There is tenderness. There is no rebound and no guarding.  Mild epigastric tenderness to palpation  Musculoskeletal: Normal range of motion. He exhibits no edema and no tenderness.  No lumbar, CVA or flank tenderness  bilaterally  Neurological: He is alert and oriented to person, place, and time.  Skin: Skin is warm and dry. He is not diaphoretic.    ED Course  Procedures (including critical care time) Labs Review Labs Reviewed  CBC WITH DIFFERENTIAL - Abnormal; Notable for the following:    Hemoglobin 17.4 (*)    MCH 34.5 (*)    All other components within normal limits  COMPREHENSIVE METABOLIC PANEL  LIPASE, BLOOD  URINALYSIS, ROUTINE W REFLEX MICROSCOPIC   Imaging Review No results found.   EKG Interpretation None      Results for orders placed during the hospital encounter of 11/16/13  CBC WITH DIFFERENTIAL      Result Value Ref Range   WBC 6.5  4.0 - 10.5  K/uL   RBC 5.04  4.22 - 5.81 MIL/uL   Hemoglobin 17.4 (*) 13.0 - 17.0 g/dL   HCT 48.6  39.0 - 52.0 %   MCV 96.4  78.0 - 100.0 fL   MCH 34.5 (*) 26.0 - 34.0 pg   MCHC 35.8  30.0 - 36.0 g/dL   RDW 14.9  11.5 - 15.5 %   Platelets 381  150 - 400 K/uL   Neutrophils Relative % 50  43 - 77 %   Neutro Abs 3.3  1.7 - 7.7 K/uL   Lymphocytes Relative 41  12 - 46 %   Lymphs Abs 2.7  0.7 - 4.0 K/uL   Monocytes Relative 6  3 - 12 %   Monocytes Absolute 0.4  0.1 - 1.0 K/uL   Eosinophils Relative 2  0 - 5 %   Eosinophils Absolute 0.1  0.0 - 0.7 K/uL   Basophils Relative 1  0 - 1 %   Basophils Absolute 0.1  0.0 - 0.1 K/uL  COMPREHENSIVE METABOLIC PANEL      Result Value Ref Range   Sodium 141  137 - 147 mEq/L   Potassium 3.7  3.7 - 5.3 mEq/L   Chloride 101  96 - 112 mEq/L   CO2 21  19 - 32 mEq/L   Glucose, Bld 118 (*) 70 - 99 mg/dL   BUN 8  6 - 23 mg/dL   Creatinine, Ser 0.94  0.50 - 1.35 mg/dL   Calcium 8.9  8.4 - 10.5 mg/dL   Total Protein 7.7  6.0 - 8.3 g/dL   Albumin 4.2  3.5 - 5.2 g/dL   AST 57 (*) 0 - 37 U/L   ALT 94 (*) 0 - 53 U/L   Alkaline Phosphatase 102  39 - 117 U/L   Total Bilirubin 0.4  0.3 - 1.2 mg/dL   GFR calc non Af Amer >90  >90 mL/min   GFR calc Af Amer >90  >90 mL/min  LIPASE, BLOOD      Result Value Ref  Range   Lipase 18  11 - 59 U/L  URINALYSIS, ROUTINE W REFLEX MICROSCOPIC      Result Value Ref Range   Color, Urine YELLOW  YELLOW   APPearance CLEAR  CLEAR   Specific Gravity, Urine 1.022  1.005 - 1.030   pH 6.0  5.0 - 8.0   Glucose, UA NEGATIVE  NEGATIVE mg/dL   Hgb urine dipstick NEGATIVE  NEGATIVE   Bilirubin Urine NEGATIVE  NEGATIVE   Ketones, ur NEGATIVE  NEGATIVE mg/dL   Protein, ur NEGATIVE  NEGATIVE mg/dL   Urobilinogen, UA 0.2  0.0 - 1.0 mg/dL   Nitrite NEGATIVE  NEGATIVE   Leukocytes, UA NEGATIVE  NEGATIVE    MDM   Alyus Mofield is a 28 y.o. male with a PMH of pancreatitis, gastric ulcer, GERD, substance abuse, HTN, hyperlipidemia, vertigo and anxiety who presents to the ED for evaluation of abdominal pain and emesis.  Rechecks  8:45 AM = Pain improved. Patient appears more comfortable.  9:20 AM = Pain returning. 8/10. Patient resting comfortably. Asking for water. Will fluid challenge.  10:30 AM = Pain improving. 6/10. States goal is 4-5/10.  11:45 Am = Pain controlled. Repeat abdominal exam benign. Ready for discharge. Patient able to ambulate without difficulty or ataxia.     Patient evaluated for chronic unchanged abdominal pain. Patient has been admitted and evaluated for this and it is suspected that his abdominal pain is due to pancreatitis.  Patient has had an EGD, [place], CT scan and MRCP which were negative. Patient has ERCP scheduled on 11/20/13 and follow-up with his GI physician on 12/04/13. Patient had improvements in his abdominal pain throughout his ED visit. Abdominal exam benign. Patient able to tolerate oral fluids without difficulty or emesis. Labs showed elevated liver enzymes which appears to be his baseline. Lipase WNL (18). UA negative. Vital signs stable. Patient instructed to continue hydrocodone and phenergan at home and call GI. Return precautions, discharge instructions, and follow-up was discussed with the patient before discharge.     Discharge  Medication List as of 11/16/2013 11:51 AM       Final impressions: 1. Abdominal pain   2. Nausea vomiting and diarrhea   3. Elevated liver enzymes       Mercy Moore PA-C   This patient was discussed with Dr. Alysia Penna, PA-C 11/16/13 [phone]

## 2013-11-16 NOTE — ED Notes (Signed)
Patient given urinal, states he will attempt to urinate for [place].

## 2013-11-16 NOTE — Discharge Instructions (Signed)
Continue hydrocodone, phenergan, Carafate, and protonix as directed  Follow a clear liquid diet for at least 24 hours - see below  Rest and drink plenty of fluids  Call your GI doctor today after discharge  Return to the emergency department if you develop any changing/worsening condition, fever, blood in your stool/vomit, repeated vomiting, chest pain, feeling lightheaded, or any other concerns (please read additional information regarding your condition below)   Abdominal Pain, Adult Many things can cause abdominal pain. Usually, abdominal pain is not caused by a disease and will improve without treatment. It can often be observed and treated at home. Your health care provider will do a physical exam and possibly order blood tests and X-rays to help determine the seriousness of your pain. However, in many cases, more time must pass before a clear cause of the pain can be found. Before that point, your health care provider may not know if you need more testing or further treatment. HOME CARE INSTRUCTIONS  Monitor your abdominal pain for any changes. The following actions may help to alleviate any discomfort you are experiencing:  Only take over-the-counter or prescription medicines as directed by your health care provider.  Do not take laxatives unless directed to do so by your health care provider.  Try a clear liquid diet (broth, tea, or water) as directed by your health care provider. Slowly move to a bland diet as tolerated. SEEK MEDICAL CARE IF:  You have unexplained abdominal pain.  You have abdominal pain associated with nausea or diarrhea.  You have pain when you urinate or have a bowel movement.  You experience abdominal pain that wakes you in the night.  You have abdominal pain that is worsened or improved by eating food.  You have abdominal pain that is worsened with eating fatty foods. SEEK IMMEDIATE MEDICAL CARE IF:   Your pain does not go away within 2 hours.  You  have a fever.  You keep throwing up (vomiting).  Your pain is felt only in portions of the abdomen, such as the right side or the left lower portion of the abdomen.  You pass bloody or black tarry stools. MAKE SURE YOU:  Understand these instructions.   Will watch your condition.   Will get help right away if you are not doing well or get worse.  Document Released: 05/12/2005 Document Revised: 05/23/2013 Document Reviewed: 04/11/2013 [facility] Patient Information 2014 [place].  Nausea and Vomiting Nausea is a sick feeling that often comes before throwing up (vomiting). Vomiting is a reflex where stomach contents come out of your mouth. Vomiting can cause severe loss of body fluids (dehydration). Children and elderly adults can become dehydrated quickly, especially if they also have diarrhea. Nausea and vomiting are symptoms of a condition or disease. It is important to find the cause of your symptoms. CAUSES   Direct irritation of the stomach lining. This irritation can result from increased acid production (gastroesophageal reflux disease), infection, food poisoning, taking certain medicines (such as nonsteroidal anti-inflammatory drugs), alcohol use, or tobacco use.  Signals from the brain.These signals could be caused by a headache, heat exposure, an inner ear disturbance, increased pressure in the brain from injury, infection, a tumor, or a concussion, pain, emotional stimulus, or metabolic problems.  An obstruction in the gastrointestinal tract (bowel obstruction).  Illnesses such as diabetes, hepatitis, gallbladder problems, appendicitis, kidney problems, cancer, sepsis, atypical symptoms of a heart attack, or eating disorders.  Medical treatments such as chemotherapy and radiation.  Receiving medicine that makes you sleep (general anesthetic) during surgery. DIAGNOSIS Your caregiver may ask for tests to be done if the problems do not improve after a few days. Tests  may also be done if symptoms are severe or if the reason for the nausea and vomiting is not clear. Tests may include:  Urine tests.  Blood tests.  Stool tests.  Cultures (to look for evidence of infection).  X-rays or other imaging studies. Test results can help your caregiver make decisions about treatment or the need for additional tests. TREATMENT You need to stay well hydrated. Drink frequently but in small amounts.You may wish to drink water, sports drinks, clear broth, or eat frozen ice pops or gelatin dessert to help stay hydrated.When you eat, eating slowly may help prevent nausea.There are also some antinausea medicines that may help prevent nausea. HOME CARE INSTRUCTIONS   Take all medicine as directed by your caregiver.  If you do not have an appetite, do not force yourself to eat. However, you must continue to drink fluids.  If you have an appetite, eat a normal diet unless your caregiver tells you differently.  Eat a variety of complex carbohydrates (rice, wheat, potatoes, bread), lean meats, yogurt, fruits, and vegetables.  Avoid high-fat foods because they are more difficult to digest.  Drink enough water and fluids to keep your urine clear or pale yellow.  If you are dehydrated, ask your caregiver for specific rehydration instructions. Signs of dehydration may include:  Severe thirst.  Dry lips and mouth.  Dizziness.  Dark urine.  Decreasing urine frequency and amount.  Confusion.  Rapid breathing or pulse. SEEK IMMEDIATE MEDICAL CARE IF:   You have blood or brown flecks (like coffee grounds) in your vomit.  You have black or bloody stools.  You have a severe headache or stiff neck.  You are confused.  You have severe abdominal pain.  You have chest pain or trouble breathing.  You do not urinate at least once every 8 hours.  You develop cold or clammy skin.  You continue to vomit for longer than 24 to 48 hours.  You have a fever. MAKE  SURE YOU:   Understand these instructions.  Will watch your condition.  Will get help right away if you are not doing well or get worse. Document Released: 08/02/2005 Document Revised: 10/25/2011 Document Reviewed: 12/30/2010 [facility] Patient Information 2014 [place], [place].  Diet The clear liquid diet consists of foods that are liquid or will become liquid at room temperature. Examples of foods allowed on a clear liquid diet include fruit juice, broth or bouillon, gelatin, or frozen ice pops. You should be able to see through the liquid. The purpose of this diet is to provide the necessary fluids, electrolytes (such as sodium and potassium), and energy to keep the body functioning during times when you are not able to consume a regular diet. A clear liquid diet should not be continued for long periods of time, as it is not nutritionally adequate.  A CLEAR LIQUID DIET MAY BE NEEDED: When a sudden-onset (acute) condition occurs before or after surgery.  As the first step in oral feeding.  For fluid and electrolyte replacement in diarrheal diseases.  As a diet before certain medical tests are performed.  ADEQUACY The clear liquid diet is adequate only in ascorbic acid, according to the Recommended Dietary Allowances of the [employer].  CHOOSING FOODS Breads and Starches Allowed: None are allowed.  Avoid: All are to be  avoided.  Vegetables Allowed: Strained vegetable juices.  Avoid: Any others.  Fruit Allowed: Strained fruit juices and fruit drinks. Include 1 serving of citrus or vitamin C-enriched fruit juice daily.  Avoid: Any others.  Meat and Meat Substitutes Allowed: None are allowed.  Avoid: All are to be avoided.  Milk Products Allowed: None are allowed.  Avoid: All are to be avoided.  Soups and Combination Foods Allowed: Clear bouillon, broth, or strained broth-based soups.  Avoid: Any others.  Desserts and  Sweets Allowed: Sugar, honey. High-protein gelatin. Flavored gelatin, ices, or frozen ice pops that do not contain milk.  Avoid: Any others.  Fats and Oils Allowed: None are allowed.  Avoid: All are to be avoided.  Beverages Allowed: Cereal beverages, coffee (regular or decaffeinated), tea, or soda at the discretion of your health care provider.  Avoid: Any others.  Condiments Allowed: Salt.  Avoid: Any others, including pepper.  Supplements Allowed: Liquid nutrition beverages that you can see through.  Avoid: Any others that contain lactose or fiber. SAMPLE MEAL PLAN Breakfast 4 oz (120 mL) strained orange juice.  to 1 cup (120 to 240 mL) gelatin (plain or fortified). 1 cup (240 mL) beverage (coffee or tea). Sugar, if desired. Midmorning Snack  cup (120 mL) gelatin (plain or fortified). Lunch 1 cup (240 mL) broth or consomm. 4 oz (120 mL) strained grapefruit juice.  cup (120 mL) gelatin (plain or fortified). 1 cup (240 mL) beverage (coffee or tea). Sugar, if desired. Midafternoon Snack  cup (120 mL) fruit ice.  cup (120 mL) strained fruit juice. Dinner 1 cup (240 mL) broth or consomm.  cup (120 mL) cranberry juice.  cup (120 mL) flavored gelatin (plain or fortified). 1 cup (240 mL) beverage (coffee or tea). Sugar, if desired. Evening Snack 4 oz (120 mL) strained apple juice (vitamin C-fortified).  cup (120 mL) flavored gelatin (plain or fortified). MAKE SURE YOU: Understand these instructions. Will watch your child's condition. Will get help right away if your child is not doing well or gets worse. Document Released: 08/02/2005 Document Revised: 04/04/2013 Document Reviewed: 01/02/2013 [facility] Patient Information 2014 [place].   Emergency Department Resource Guide 1) Find a Doctor and Pay Out of Pocket Although you won't have to find out who is covered by your insurance plan, it is a good idea to ask around and get  recommendations. You will then need to call the office and see if the doctor you have chosen will accept you as a new patient and what types of options they offer for patients who are self-pay. Some doctors offer discounts or will set up payment plans for their patients who do not have insurance, but you will need to ask so you aren't surprised when you get to your appointment.  2) Contact Your Local Health Department Not all health departments have doctors that can see patients for sick visits, but many do, so it is worth a call to see if yours does. If you don't know where your local health department is, you can check in your phone book. The CDC also has a tool to help you locate your state's health department, and many state websites also have listings of all of their local health departments.  3) Find a [facility] If your illness is not likely to be very severe or complicated, you may want to try a walk in clinic. These are popping up all over the country in pharmacies, drugstores, and shopping centers. They're usually staffed by nurse  practitioners or physician assistants that have been trained to treat common illnesses and complaints. They're usually fairly quick and inexpensive. However, if you have serious medical issues or chronic medical problems, these are probably not your best option.  No Primary Care Doctor: - Call Health Connect at  [phone] - they can help you locate a primary care doctor that  accepts your insurance, provides certain services, etc. - Physician Referral Service- [phone]  Chronic Pain Problems: Organization         Address  Phone   Notes  [facility]  [phone] Patients need to be referred by their primary care doctor.   Medication Assistance: Organization         Address  Phone   Notes  [facility] Medication [facility] [address] [place], [place] [address] [phone] --Must be a resident of  [facility] -- Must have NO insurance coverage whatsoever (no Medicaid/ Medicare, etc.) -- The pt. MUST have a primary care doctor that directs their care regularly and follows them in the community   MedAssist  [phone]   [employer]  [phone]    Agencies that provide inexpensive medical care: Organization         Address  Phone   Notes  [place]  [phone]   [facility]    [phone]   [facility] [place], [place] [address] [phone]   [place] [address], [place] [phone]   Planned Parenthood    [phone]   [facility]    [phone]   [place] and [place] [address], [place] Phone:  [phone], Fax:  [phone] Hours of Operation:  9 am - 6 pm, M-F.  Also accepts Medicaid/Medicare and self-pay.  [facility] for [place], [address], [place] Phone: [phone], Fax: [phone]. Hours of Operation:  8:30 am - 5:30 pm, M-F.  Also accepts Medicaid and self-pay.  [facility] [place] [address] [place] Phone: [phone]   [place], [place], [place] [phone], Ext. [phone] Mondays & Thursdays: 7-9 AM.  First 15 patients are seen on a first come, first serve basis.    [place] Providers:  Organization         Address  Phone   Notes  [facility] [place] [address], [place] [phone] Also accepts self-pay patients.  [facility] [address], [place]  [phone]   [place], [address], [place] [phone]   [facility] [address], [place] [phone]   Lucianne Lei [address], [place]   [phone] Only accepts [place] Access  [place] patients after they have their name applied to their card.   Self-Pay (no insurance) in [facility]:  Organization         Address  Phone   Notes  Sickle Cell Patients, [facility] [place] [phone]   [facility] [place] [phone]   [facility] [place]  [place], [place] [phone]   [facility]/Dr. Vista Lawman  [address] [place] or [address], [place] [phone] Phone number for both [place] and [place] locations is the same.  [facility] [address], [place] [phone]   [facility] [address], [place] or [address] [phone]   [facility] [address] [place] [phone], phone; [phone], fax Sees patients 1st and 3rd Saturday of every month.  Must not qualify for public or private insurance (i.e. Medicaid, Medicare, [place] Health Choice, Veterans' Benefits)  Household income should be no more than 200% of the poverty level The clinic cannot treat you if you are pregnant or think you are pregnant  Sexually transmitted diseases are not treated at the clinic.    Dental Care: Organization         Address  Phone  Notes  [facility] Department of [facility] [address] [place], [place] [phone] Accepts children up to age 67 who are enrolled in [place] or [place] Health Choice; pregnant women with a Medicaid card; and children who have applied for Medicaid or [place] Health Choice, but were declined, whose parents can pay a reduced fee at time of service.  [facility]  [address], [place] [phone] Accepts children up to age 20 who are enrolled in [place] or [place] Health Choice; pregnant women with a Medicaid  card; and children who have applied for Medicaid or  Health Choice, but were declined, whose parents can pay a reduced fee at time of service.  [facility]  [address] [place], [place] [phone] Patients are seen by appointment only. Walk-ins are not accepted. [facility] will see patients 70 years of age and older. Monday - Tuesday (8am-5pm) Most Wednesdays (8:30-5pm) $30 per visit, cash only  [facility] Adult Dental Access PROGRAM  [address], [facility] [phone] Patients are seen by appointment only. Walk-ins are not accepted. [facility] will see patients 62 years of age and older. One Wednesday Evening (Monthly: Volunteer Based).  $30 per visit, cash only  [employer]  [phone] for adults; Children under age 99, call Graduate Pediatric Dentistry at [phone]. Children aged 39-14, please call [phone] to request a pediatric application.  Dental services are provided in all areas of dental care including fillings, crowns and bridges, complete and partial dentures, implants, gum treatment, root canals, and extractions. Preventive care is also provided. Treatment is provided to both adults and children. Patients are selected via a lottery and there is often a waiting list.   [facility] [address], [place]  [phone] [email or web address]   [facility] [address] [place], [place] [phone], Ext. [phone] Second and Fourth Thursday of each month, opens at 6:30 AM; Clinic ends at 9 AM.  Patients are seen on a first-come first-served basis, and a limited number are seen during each clinic.   [facility]  [address] [place], [place] [phone]   Eligibility Requirements You must have lived in [place], [place], or [place] counties for at least the last three months.   You cannot be eligible for state or federal sponsored [place],  including [employer], [place], or [employer].   You generally cannot be eligible for healthcare insurance through your employer.  How to apply: Eligibility screenings are held every Tuesday and Wednesday afternoon from 1:00 pm until 4:00 pm. You do not need an appointment for the interview!  [facility] [address], [place], [place]   [place]  [place] Department  [place]  [phone]    Behavioral Health Resources in the Community: Intensive Outpatient Programs Organization         Address  Phone  Notes  [place]. [address], [place], [place] [phone]   [facility] [address], [place], [place]   ADS: [facility] Drug Svcs [address], [place], [place]   [place] [address]  [place], [place] or [phone]   Substance Abuse Resources Organization         Address  Phone  Notes  Alcohol and Drug Services  [phone]   [place]  [phone]   The [place]   Chinita Pester  [phone]   Residential & Outpatient Substance Abuse Program  [phone]   Psychological Services Organization         Address  Phone  Notes  [facility]  [place]  [phone]   [place] [address] [place] or [phone]    Mobile Crisis Teams Organization         Address  Phone  Notes  [facility], Mobile Crisis Care Unit  [phone]   [facility]  [address] [place], [place]   [place] [address], [place] [phone]    Self-Help/Support Groups Organization         Address  Phone             Notes  [place].  of [place] - variety of support groups  [place] Call for more information  Narcotics Anonymous (NA), [facility] [address], [employer] [place]  2 meetings at this location   Special educational needs teacher         Address  Phone  Notes  [facility]idential [facility] [place],    [place]  [phone]   [facility]  [address], [place] [id number], [place], [place]   [place], [place] [phone] Admissions: 8am-3pm M-F  Incentives Substance [place] [address]    [place], [place] [phone]   [facility] [address] [place], [place], [place]   [facility] [address]  [place], [place]   [facility] - Intensive Outpatient [address], [place], [place]   [facility] [address]  [place], [place] [phone] or [phone]   [facility] (RT[facility]) [address], [place], [place] Accepts Medicaid  [facility] [place] [address],  [place] [phone] Substance Abuse/Addiction Treatment   [facility] Organization         Address  Phone  Notes  [facility]  [phone]   Domenic Schwab, PhD [address] Arlis [place], [place]   [phone] or [phone]   [place], [place] [phone]   [facility] [address],  [place], [place] [phone] Insurance/Medicaid/sponsorship through [facility] [place] and Families [address], Ste [place], [place] [phone] [place] [address]   [place], [place] [phone]    Dr. Adele Schilder  [phone]   [facility] Dept. 1) [address] [place] 2)  [place] 3)  [address], [place] [phone]  [phone]   [place] [phone] or [phone] (After Hours)

## 2013-11-19 NOTE — ED Provider Notes (Signed)
Medical screening examination/treatment/procedure(s) were performed by non-physician practitioner and as supervising physician I was immediately available for consultation/collaboration.   EKG Interpretation None       Virgel Manifold, MD 11/19/13 [phone]

## 2013-11-20 ENCOUNTER — Ambulatory Visit (HOSPITAL_COMMUNITY): Payer: No Typology Code available for payment source

## 2013-11-22 ENCOUNTER — Other Ambulatory Visit: Payer: Self-pay

## 2013-11-22 ENCOUNTER — Encounter (HOSPITAL_COMMUNITY): Payer: Self-pay | Admitting: Emergency Medicine

## 2013-11-22 ENCOUNTER — Emergency Department (HOSPITAL_COMMUNITY): Payer: No Typology Code available for payment source

## 2013-11-22 ENCOUNTER — Emergency Department (HOSPITAL_COMMUNITY)
Admission: EM | Admit: 2013-11-22 | Discharge: 2013-11-22 | Disposition: A | Discharge status: Home / Self Care | Payer: Self-pay | Attending: Emergency Medicine | Admitting: Emergency Medicine

## 2013-11-22 DIAGNOSIS — Z8711 Personal history of peptic ulcer disease: Secondary | ICD-10-CM | POA: Insufficient documentation

## 2013-11-22 DIAGNOSIS — R109 Unspecified abdominal pain: Secondary | ICD-10-CM

## 2013-11-22 DIAGNOSIS — F17200 Nicotine dependence, unspecified, uncomplicated: Secondary | ICD-10-CM | POA: Insufficient documentation

## 2013-11-22 DIAGNOSIS — R197 Diarrhea, unspecified: Secondary | ICD-10-CM | POA: Insufficient documentation

## 2013-11-22 DIAGNOSIS — R1012 Left upper quadrant pain: Secondary | ICD-10-CM | POA: Insufficient documentation

## 2013-11-22 DIAGNOSIS — R1011 Right upper quadrant pain: Secondary | ICD-10-CM | POA: Insufficient documentation

## 2013-11-22 DIAGNOSIS — R1013 Epigastric pain: Secondary | ICD-10-CM | POA: Insufficient documentation

## 2013-11-22 DIAGNOSIS — I10 Essential (primary) hypertension: Secondary | ICD-10-CM | POA: Insufficient documentation

## 2013-11-22 DIAGNOSIS — G8921 Chronic pain due to trauma: Secondary | ICD-10-CM | POA: Insufficient documentation

## 2013-11-22 DIAGNOSIS — Z79899 Other long term (current) drug therapy: Secondary | ICD-10-CM | POA: Insufficient documentation

## 2013-11-22 LAB — LIPASE, BLOOD: LIPASE: 12 U/L (ref 11–59)

## 2013-11-22 LAB — CBC WITH DIFFERENTIAL/PLATELET
BASOS PCT: 0 % (ref 0–1)
Basophils Absolute: 0.1 10*3/uL (ref 0.0–0.1)
Eosinophils Absolute: 0.2 10*3/uL (ref 0.0–0.7)
Eosinophils Relative: 1 % (ref 0–5)
HCT: 51.8 % (ref 39.0–52.0)
HEMOGLOBIN: 18.7 g/dL — AB (ref 13.0–17.0)
LYMPHS ABS: 2.7 10*3/uL (ref 0.7–4.0)
Lymphocytes Relative: 20 % (ref 12–46)
MCH: 34.5 pg — AB (ref 26.0–34.0)
MCHC: 36.1 g/dL — AB (ref 30.0–36.0)
MCV: 95.6 fL (ref 78.0–100.0)
MONO ABS: 0.5 10*3/uL (ref 0.1–1.0)
MONOS PCT: 4 % (ref 3–12)
Neutro Abs: 9.7 10*3/uL — ABNORMAL HIGH (ref 1.7–7.7)
Neutrophils Relative %: 74 % (ref 43–77)
Platelets: 404 10*3/uL — ABNORMAL HIGH (ref 150–400)
RBC: 5.42 MIL/uL (ref 4.22–5.81)
RDW: 15.1 % (ref 11.5–15.5)
WBC: 13 10*3/uL — ABNORMAL HIGH (ref 4.0–10.5)

## 2013-11-22 LAB — BASIC METABOLIC PANEL
BUN: 4 mg/dL — ABNORMAL LOW (ref 6–23)
CHLORIDE: 105 meq/L (ref 96–112)
CO2: 16 mEq/L — ABNORMAL LOW (ref 19–32)
Calcium: 7.8 mg/dL — ABNORMAL LOW (ref 8.4–10.5)
Creatinine, Ser: 0.91 mg/dL (ref 0.50–1.35)
Glucose, Bld: 90 mg/dL (ref 70–99)
Potassium: 4.3 mEq/L (ref 3.7–5.3)
SODIUM: 138 meq/L (ref 137–147)

## 2013-11-22 LAB — URINALYSIS, ROUTINE W REFLEX MICROSCOPIC
BILIRUBIN URINE: NEGATIVE
Glucose, UA: NEGATIVE mg/dL
Hgb urine dipstick: NEGATIVE
KETONES UR: NEGATIVE mg/dL
LEUKOCYTES UA: NEGATIVE
NITRITE: NEGATIVE
PH: 5.5 (ref 5.0–8.0)
PROTEIN: NEGATIVE mg/dL
Specific Gravity, Urine: 1.022 (ref 1.005–1.030)
UROBILINOGEN UA: 0.2 mg/dL (ref 0.0–1.0)

## 2013-11-22 LAB — COMPREHENSIVE METABOLIC PANEL
ALT: 84 U/L — ABNORMAL HIGH (ref 0–53)
AST: 76 U/L — ABNORMAL HIGH (ref 0–37)
Albumin: 4.3 g/dL (ref 3.5–5.2)
Alkaline Phosphatase: 101 U/L (ref 39–117)
BILIRUBIN TOTAL: 0.8 mg/dL (ref 0.3–1.2)
BUN: 5 mg/dL — AB (ref 6–23)
CHLORIDE: 98 meq/L (ref 96–112)
CO2: 16 mEq/L — ABNORMAL LOW (ref 19–32)
CREATININE: 1.03 mg/dL (ref 0.50–1.35)
Calcium: 9 mg/dL (ref 8.4–10.5)
GFR calc non Af Amer: >90 mL/min (ref >90)
GLUCOSE: 117 mg/dL — AB (ref 70–99)
Potassium: 4.6 mEq/L (ref 3.7–5.3)
Sodium: 139 mEq/L (ref 137–147)
Total Protein: 7.9 g/dL (ref 6.0–8.3)

## 2013-11-22 LAB — I-STAT CG4 LACTIC ACID, ED: Lactic Acid, Venous: 2.34 mmol/L — ABNORMAL HIGH (ref 0.5–2.2)

## 2013-11-22 MED ORDER — FENTANYL CITRATE 0.05 MG/ML IJ SOLN
50.0000 ug | Freq: Once | INTRAMUSCULAR | Status: AC
  Administered 2013-11-22: 50 ug via INTRAVENOUS
  Filled 2013-11-22: qty 2

## 2013-11-22 MED ORDER — SODIUM CHLORIDE 0.9 % IV BOLUS (SEPSIS)
1000.0000 mL | Freq: Once | INTRAVENOUS | Status: AC
  Administered 2013-11-22: 1000 mL via INTRAVENOUS

## 2013-11-22 MED ORDER — HYDROMORPHONE HCL PF 1 MG/ML IJ SOLN
1.0000 mg | Freq: Once | INTRAMUSCULAR | Status: AC
  Administered 2013-11-22: 1 mg via INTRAVENOUS
  Filled 2013-11-22: qty 1

## 2013-11-22 MED ORDER — OXYCODONE HCL 5 MG PO TABS: 5.0000 mg | ORAL_TABLET | ORAL | Status: DC | PRN

## 2013-11-22 MED ORDER — FENTANYL CITRATE 0.05 MG/ML IJ SOLN
100.0000 ug | Freq: Once | INTRAMUSCULAR | Status: AC
  Administered 2013-11-22: 100 ug via INTRAVENOUS
  Filled 2013-11-22: qty 2

## 2013-11-22 MED ORDER — PANTOPRAZOLE SODIUM 40 MG IV SOLR
40.0000 mg | Freq: Once | INTRAVENOUS | Status: AC
  Administered 2013-11-22: 40 mg via INTRAVENOUS
  Filled 2013-11-22: qty 40

## 2013-11-22 MED ORDER — PROMETHAZINE HCL 25 MG RE SUPP: 25.0000 mg | Freq: Four times a day (QID) | RECTAL | Status: DC | PRN

## 2013-11-22 MED ORDER — OXYCODONE-ACETAMINOPHEN 5-325 MG PO TABS
2.0000 | ORAL_TABLET | Freq: Once | ORAL | Status: AC
  Administered 2013-11-22: 2 via ORAL
  Filled 2013-11-22: qty 2

## 2013-11-22 MED ORDER — OXYCODONE-ACETAMINOPHEN 5-325 MG PO TABS: 1.0000 | ORAL_TABLET | Freq: Once | ORAL | Status: DC

## 2013-11-22 MED ORDER — METOCLOPRAMIDE HCL 5 MG/ML IJ SOLN
10.0000 mg | Freq: Once | INTRAMUSCULAR | Status: AC
  Administered 2013-11-22: 10 mg via INTRAVENOUS
  Filled 2013-11-22: qty 2

## 2013-11-22 MED ORDER — OXYCODONE-ACETAMINOPHEN 5-325 MG PO TABS: 1.0000 | ORAL_TABLET | ORAL | Status: DC | PRN

## 2013-11-22 MED ORDER — SODIUM CHLORIDE 0.9 % IV SOLN
1000.0000 mL | Freq: Once | INTRAVENOUS | Status: AC
  Administered 2013-11-22: 1000 mL via INTRAVENOUS

## 2013-11-22 MED ORDER — PROMETHAZINE HCL 25 MG RE SUPP: 25.0000 mg | Freq: Four times a day (QID) | RECTAL | Status: AC | PRN

## 2013-11-22 NOTE — Progress Notes (Signed)
[facility] CL spoke with patient about [facility] [employer]. Patient had [employer] which expired on 08/15/13. Patient stated that he was in the process of getting Orange Card at  [employer]. Patient declined CL offer to schedule an enrollment apt, stating that he would use there walk-in hours. Patient stated that he already had an application filled out and did not need another one. Patient also stated that he was receiving a "hospital discount" and was trying to get Medicaid.

## 2013-11-22 NOTE — ED Provider Notes (Signed)
CSN: [id number]     Arrival date & time 11/22/13  0602 History   First MD Initiated Contact with Patient 11/22/13 0645     Chief Complaint  Patient presents with  . Emesis  . Diarrhea     (Consider location/radiation/quality/duration/timing/severity/associated sxs/prior Treatment) HPI Comments: Patient with chronic epigastric pain starting last fall, followed by GI ([facility]), work-up including MRCP, CT [place], upper GI (neg). Missed HIDA scan yesterday -- presents with c/o worsening epigastric pain. Pain flared 9 days ago. Seen in ED 6 days ago, received meds/fluids, felt a bit better. Pain worsened 2 days ago. Associated with N/V, watery diarrhea. He is unable to keep down PO protonix, carafate, phenergan at home. Pain is same as previous but more severe. Described as sharp, through to back. Records show substance abuse history. States no narcotics for 9 days, they don't really help. He has filled several prescriptions over the past 3 months. + smoker. Denies alcohol, illicit drugs, marijuana.   Of note, patient was admitted end of 07/2013 -- thought at that time N/V might be due to methadone withdrawal after which he completed tapering that month.    Patient is a 28 y.o. male presenting with vomiting and diarrhea. The history is provided by the patient and medical records.  Emesis Associated symptoms: abdominal pain and diarrhea   Associated symptoms: no headaches, no myalgias and no sore throat   Diarrhea Associated symptoms: abdominal pain and vomiting   Associated symptoms: no fever, no headaches and no myalgias     Past Medical History  Diagnosis Date  . Hypertension   . Vertigo   . Pancreatitis, acute 08/14/2013  . Ulcer     gastric uler  . GERD (gastroesophageal reflux disease)   . Hyperlipidemia     no meds  . Substance abuse     tx of methadon  . Anxiety     panic attack while having a MRI   Past Surgical History  Procedure Laterality Date  . Appendectomy     Family  History  Problem Relation Age of Onset  . High blood pressure Mother   . Colon polyps Mother   . High blood pressure Father   . Migraines Paternal Grandmother   . Colon cancer Neg Hx   . Esophageal cancer Neg Hx   . Prostate cancer Neg Hx   . Pancreatic cancer Neg Hx   . Rectal cancer Neg Hx   . Stomach cancer Neg Hx    History  Substance Use Topics  . Smoking status: Current Every Day Smoker -- 1.00 packs/day for .5 years    Types: Cigarettes  . Smokeless tobacco: Never Used  . Alcohol Use: No    Review of Systems  Constitutional: Negative for fever.  HENT: Negative for rhinorrhea and sore throat.   Eyes: Negative for redness.  Respiratory: Negative for cough.   Cardiovascular: Negative for chest pain.  Gastrointestinal: Positive for nausea, vomiting, abdominal pain and diarrhea.  Genitourinary: Negative for dysuria.  Musculoskeletal: Negative for myalgias.  Skin: Negative for rash.  Neurological: Negative for headaches.      Allergies  Zofran  Home Medications   Current Outpatient Rx  Name  Route  Sig  Dispense  Refill  . pantoprazole (PROTONIX) 40 MG tablet   Oral   Take 1 tablet (40 mg total) by mouth 2 (two) times daily.   60 tablet   11   . promethazine (PHENERGAN) 25 MG tablet   Oral   Take 1  tablet (25 mg total) by mouth every 6 (six) hours as needed for nausea.   60 tablet   2   . sucralfate (CARAFATE) 1 G tablet   Oral   Take 1 g by mouth 2 (two) times daily.          BP 127/102  Pulse 148  Temp(Src) 99 F (37.2 C) (Oral)  Resp 26  SpO2 98%  Physical Exam  Nursing note and vitals reviewed. Constitutional: He appears well-developed and well-nourished.  HENT:  Head: Normocephalic and atraumatic.  Eyes: Conjunctivae are normal. Right eye exhibits no discharge. Left eye exhibits no discharge.  Neck: Normal range of motion. Neck supple.  Cardiovascular: Normal rate, regular rhythm and normal heart sounds.   Pulmonary/Chest: Effort  normal and breath sounds normal.  Abdominal: Soft. There is tenderness (moderate) in the right upper quadrant, epigastric area and left upper quadrant. There is no rigidity, no rebound, no guarding and no CVA tenderness.  Neurological: He is alert.  Skin: Skin is warm and dry.  Psychiatric: He has a normal mood and affect.    ED Course  Procedures (including critical care time) Labs Review Labs Reviewed  CBC WITH DIFFERENTIAL - Abnormal; Notable for the following:    WBC 13.0 (*)    Hemoglobin 18.7 (*)    MCH 34.5 (*)    MCHC 36.1 (*)    Platelets 404 (*)    Neutro Abs 9.7 (*)    All other components within normal limits  COMPREHENSIVE METABOLIC PANEL - Abnormal; Notable for the following:    CO2 16 (*)    Glucose, Bld 117 (*)    BUN 5 (*)    AST 76 (*)    ALT 84 (*)    All other components within normal limits  URINALYSIS, ROUTINE W REFLEX MICROSCOPIC - Abnormal; Notable for the following:    Color, Urine AMBER (*)    APPearance CLOUDY (*)    All other components within normal limits  BASIC METABOLIC PANEL - Abnormal; Notable for the following:    CO2 16 (*)    BUN 4 (*)    Calcium 7.8 (*)    All other components within normal limits  I-STAT CG4 LACTIC ACID, ED - Abnormal; Notable for the following:    Lactic Acid, Venous 2.34 (*)    All other components within normal limits  LIPASE, BLOOD   Imaging Review Dg Abd Acute W/chest  11/22/2013   CLINICAL DATA:  28 year old male with severe upper abdominal pain nausea diarrhea. Initial encounter.  EXAM: ACUTE ABDOMEN SERIES (ABDOMEN 2 VIEW & CHEST 1 VIEW)  COMPARISON:  Chest radiographs 09/29/2013. KUB 10/28/2013. CT Abdomen and Pelvis 09/24/2013.  FINDINGS: Continued low lung volumes. Normal cardiac size and mediastinal contours. Visualized tracheal air column is within normal limits. No pneumothorax or pneumoperitoneum. No acute pulmonary opacity.  Non obstructed bowel gas pattern. Abdominal and pelvic visceral contours are  within normal limits. Scoliosis. Stable visualized osseous structures.  IMPRESSION: 1.  Non obstructed bowel gas pattern, no free air. 2.  No acute cardiopulmonary abnormality.   Electronically Signed   By: Lars Pinks M.D.   On: 11/22/2013 07:28     EKG Interpretation None      7:02 AM Patient seen and examined. Pt appears distressed. WBC elevated from previous. Work-up initiated. Medications ordered. Acute chest series ordered to ensure no obvious obstruction/free air.   Vital signs reviewed and are as follows: Filed Vitals:   11/22/13 0612  BP:  127/102  Pulse: 148  Temp: 99 F (37.2 C)  Resp: 26   8:32 AM AG of 25 noted. Additional fluids ordered.   11:09 AM Recheck patient is more comfortable. Sipping on water. Requesting more pain medication. Pat informed of results. Will recheck BMP after 3L.    Results d/w Dr. Tawnya Crook.  AG = 17 on recheck.   Patient notes improvement with fluids, IV meds. Abdomen is minimally tender on re-exam.   We discussed limited utility of imaging at this time, given he has had extensive work-up and negative findings to this point. Pt agrees. He will call GI and f/u as soon as possible.   Pain medication and phenergan for home. Patient counseled on use of narcotic pain medications. Counseled not to combine these medications with others containing tylenol. Urged not to drink alcohol, drive, or perform any other activities that requires focus while taking these medications. The patient verbalizes understanding and agrees with the plan.  The patient was urged to return to the Emergency Department immediately with worsening of current symptoms, uncontrolled pain, worsening abdominal pain, persistent vomiting, blood noted in stools, fever, or any other concerns. The patient verbalized understanding.     MDM   Final diagnoses:  Abdominal pain   Patient with chronic abd pain, same pain character as prior. +N/V although no vomiting here. Upper GI, MRCP, CT  x 2, [place] x 2 neg. He follows with GI. Elevation in WBC today but this is not specific. Pt appears distressed, but much improved with treatment. Labs indicate dehydration, treated with 3L+ fluid here. AG improved with fluids (25->17). Tolerating PO's too. Cannot completely r/o narcotics withdrawal as element of presentation. Pain started several months ago after tapering off methadone. Since then he has had multiple rxs for same filled. It's tough to know if he is being entirely truthful. However, no dangerous or life-threatening conditions suspected or identified by history, physical exam, and by work-up today. No indications for hospitalization identified. Do not feel additional imaging indicated today.      Carlisle Cater, PA-C 11/22/13 [phone]

## 2013-11-22 NOTE — ED Notes (Signed)
Pt reports epigastric pain that started last week. Pt states he has n/vx5/dx15 and unable to keep anything down. Pt denies fever. Pt shivering in room. Pt alert and ambulatory to room.

## 2013-11-22 NOTE — ED Notes (Signed)
Patient given urinal, attempted to urinate. Pt unable to void at this time.

## 2013-11-22 NOTE — Discharge Instructions (Signed)
Please read and follow all provided instructions.  Your diagnoses today include:  1. Abdominal pain     Tests performed today include:  Blood counts and electrolytes  Blood tests to check liver and kidney function  Blood tests to check pancreas function  Urine test to look for infection and pregnancy (in women)  Vital signs. See below for your results today.   Medications prescribed:   Oxycodone - narcotic pain medication  DO NOT drive or perform any activities that require you to be awake and alert because this medicine can make you drowsy.    Phenergan (promethazine) - for nausea and vomiting  Take any prescribed medications only as directed.  Home care instructions:   Follow any educational materials contained in this packet.  Follow-up instructions: Please follow-up with your primary care provider in the next 3 days for further evaluation of your symptoms. If you do not have a primary care doctor -- see below for referral information.   Return instructions:  SEEK IMMEDIATE MEDICAL ATTENTION IF:  The pain does not go away or becomes severe   A temperature above 101F develops   Repeated vomiting occurs (multiple episodes)   The pain becomes localized to portions of the abdomen. The right side could possibly be appendicitis. In an adult, the left lower portion of the abdomen could be colitis or diverticulitis.   Blood is being passed in stools or vomit (bright red or black tarry stools)   You develop chest pain, difficulty breathing, dizziness or fainting, or become confused, poorly responsive, or inconsolable (young children)  If you have any other emergent concerns regarding your health  Additional Information: Abdominal (belly) pain can be caused by many things. Your caregiver performed an examination and possibly ordered blood/urine tests and imaging (CT scan, x-rays, ultrasound). Many cases can be observed and treated at home after initial evaluation in the  emergency department. Even though you are being discharged home, abdominal pain can be unpredictable. Therefore, you need a repeated exam if your pain does not resolve, returns, or worsens. Most patients with abdominal pain don't have to be admitted to the hospital or have surgery, but serious problems like appendicitis and gallbladder attacks can start out as nonspecific pain. Many abdominal conditions cannot be diagnosed in one visit, so follow-up evaluations are very important.  Your vital signs today were: BP 138/93   Pulse 87   Temp(Src) 98.4 F (36.9 C) (Oral)   Resp 18   SpO2 97% If your blood pressure (bp) was elevated above 135/85 this visit, please have this repeated by your doctor within one month. --------------

## 2013-11-22 NOTE — ED Provider Notes (Signed)
Medical screening examination/treatment/procedure(s) were performed by non-physician practitioner and as supervising physician I was immediately available for consultation/collaboration.   EKG Interpretation None        Neta Ehlers, MD 11/22/13 2100

## 2013-11-29 ENCOUNTER — Encounter: Payer: Self-pay | Admitting: Internal Medicine

## 2013-11-30 ENCOUNTER — Encounter (HOSPITAL_COMMUNITY): Payer: Self-pay | Admitting: Emergency Medicine

## 2013-11-30 ENCOUNTER — Emergency Department (HOSPITAL_COMMUNITY)
Admission: EM | Admit: 2013-11-30 | Discharge: 2013-12-01 | Disposition: A | Discharge status: Home / Self Care | Payer: No Typology Code available for payment source | Attending: Emergency Medicine | Admitting: Emergency Medicine

## 2013-11-30 DIAGNOSIS — Z79899 Other long term (current) drug therapy: Secondary | ICD-10-CM | POA: Insufficient documentation

## 2013-11-30 DIAGNOSIS — K219 Gastro-esophageal reflux disease without esophagitis: Secondary | ICD-10-CM | POA: Insufficient documentation

## 2013-11-30 DIAGNOSIS — E785 Hyperlipidemia, unspecified: Secondary | ICD-10-CM | POA: Insufficient documentation

## 2013-11-30 DIAGNOSIS — I10 Essential (primary) hypertension: Secondary | ICD-10-CM | POA: Insufficient documentation

## 2013-11-30 DIAGNOSIS — R109 Unspecified abdominal pain: Secondary | ICD-10-CM

## 2013-11-30 DIAGNOSIS — Z8659 Personal history of other mental and behavioral disorders: Secondary | ICD-10-CM | POA: Insufficient documentation

## 2013-11-30 DIAGNOSIS — Z9089 Acquired absence of other organs: Secondary | ICD-10-CM | POA: Insufficient documentation

## 2013-11-30 DIAGNOSIS — F17200 Nicotine dependence, unspecified, uncomplicated: Secondary | ICD-10-CM | POA: Insufficient documentation

## 2013-11-30 DIAGNOSIS — R1084 Generalized abdominal pain: Secondary | ICD-10-CM | POA: Insufficient documentation

## 2013-11-30 DIAGNOSIS — G8929 Other chronic pain: Secondary | ICD-10-CM | POA: Insufficient documentation

## 2013-11-30 MED ORDER — SODIUM CHLORIDE 0.9 % IV BOLUS (SEPSIS)
1000.0000 mL | Freq: Once | INTRAVENOUS | Status: AC
  Administered 2013-12-01: 1000 mL via INTRAVENOUS

## 2013-11-30 MED ORDER — PANTOPRAZOLE SODIUM 40 MG IV SOLR
40.0000 mg | Freq: Once | INTRAVENOUS | Status: AC
  Administered 2013-12-01: 40 mg via INTRAVENOUS
  Filled 2013-11-30: qty 40

## 2013-11-30 MED ORDER — PROMETHAZINE HCL 25 MG/ML IJ SOLN
25.0000 mg | Freq: Once | INTRAMUSCULAR | Status: AC
  Administered 2013-12-01: 25 mg via INTRAVENOUS
  Filled 2013-11-30: qty 1

## 2013-11-30 MED ORDER — HYDROMORPHONE HCL PF 1 MG/ML IJ SOLN
1.0000 mg | Freq: Once | INTRAMUSCULAR | Status: AC
  Administered 2013-12-01: 1 mg via INTRAVENOUS
  Filled 2013-11-30: qty 1

## 2013-11-30 NOTE — ED Notes (Signed)
Pt c/o severe abd pain with n/v/d onset first of the week. Pt states she has been seen for this weekly, unkn etiology

## 2013-11-30 NOTE — ED Provider Notes (Signed)
CSN: [id number]     Arrival date & time 11/30/13  2330 History   First MD Initiated Contact with Patient 11/30/13 2340     Chief Complaint  Patient presents with  . Abdominal Pain  . Emesis     (Consider location/radiation/quality/duration/timing/severity/associated sxs/prior Treatment) HPI Patient has history of chronic abdominal pain for the past 7 months. He is followed closely by gastroenterology. He's had multiple CT scans, ultrasounds and MR that are nondiagnostic. The patient was seen last week for an exacerbation of his ongoing epigastric pain. History the emergency department with IV medication and fluid. He states the symptoms improved for several days but then over the last few days have progressed. He is unable to keep fluids down without vomiting. He continues to have loose stool. He has epigastric pain to his back. He describes the pain as sharp. He's had no fevers or chills. Has no blood in the vomit. No melena or bloody stools. He has an appointment with his gastroenterologist next week. Past Medical History  Diagnosis Date  . Hypertension   . Vertigo   . Pancreatitis, acute 08/14/2013  . Ulcer     gastric uler  . GERD (gastroesophageal reflux disease)   . Hyperlipidemia     no meds  . Substance abuse     tx of methadon  . Anxiety     panic attack while having a MRI   Past Surgical History  Procedure Laterality Date  . Appendectomy     Family History  Problem Relation Age of Onset  . High blood pressure Mother   . Colon polyps Mother   . High blood pressure Father   . Migraines Paternal Grandmother   . Colon cancer Neg Hx   . Esophageal cancer Neg Hx   . Prostate cancer Neg Hx   . Pancreatic cancer Neg Hx   . Rectal cancer Neg Hx   . Stomach cancer Neg Hx    History  Substance Use Topics  . Smoking status: Current Every Day Smoker -- 1.00 packs/day for .5 years    Types: Cigarettes  . Smokeless tobacco: Never Used  . Alcohol Use: No    Review of  Systems  Constitutional: Negative for fever and chills.  Respiratory: Negative for shortness of breath.   Cardiovascular: Negative for chest pain.  Gastrointestinal: Positive for nausea, vomiting, abdominal pain and diarrhea. Negative for constipation, blood in stool and anal bleeding.  Genitourinary: Negative for hematuria and flank pain.  Musculoskeletal: Negative for back pain, neck pain and neck stiffness.  Skin: Negative for rash and wound.  Neurological: Negative for dizziness, weakness, light-headedness and numbness.      Allergies  Zofran  Home Medications   Prior to Admission medications   Medication Sig Start Date End Date Taking? Authorizing Provider  oxyCODONE (OXY IR/ROXICODONE) 5 MG immediate release tablet Take 1 tablet (5 mg total) by mouth every 4 (four) hours as needed for severe pain. 11/22/13   Carlisle Cater, PA-C  pantoprazole (PROTONIX) 40 MG tablet Take 1 tablet (40 mg total) by mouth 2 (two) times daily. 11/12/13   Reyne Dumas, MD  promethazine (PHENERGAN) 25 MG suppository Place 1 suppository (25 mg total) rectally every 6 (six) hours as needed for nausea or vomiting. 11/22/13   Carlisle Cater, PA-C  promethazine (PHENERGAN) 25 MG tablet Take 1 tablet (25 mg total) by mouth every 6 (six) hours as needed for nausea. 11/12/13   Reyne Dumas, MD  sucralfate (CARAFATE) 1 G tablet Take  1 g by mouth 2 (two) times daily.    Historical Provider, MD   BP 160/105  Pulse 116  Resp 18  Ht 6' (1.829 m)  Wt 223 lb (101.152 kg)  BMI 30.24 kg/m2  SpO2 97% Physical Exam  Nursing note and vitals reviewed. Constitutional: He is oriented to person, place, and time. He appears well-developed and well-nourished. He appears distressed.  Patient appears uncomfortable.  HENT:  Head: Normocephalic and atraumatic.  Mouth/Throat: Oropharynx is clear and moist.  Eyes: EOM are normal. Pupils are equal, round, and reactive to light.  Neck: Normal range of motion. Neck supple.   Cardiovascular: Normal rate and regular rhythm.   Pulmonary/Chest: Effort normal and breath sounds normal. No respiratory distress. He has no wheezes. He has no rales. He exhibits no tenderness.  Abdominal: Soft. Bowel sounds are normal. He exhibits no distension and no mass. There is tenderness (diffuse upper abdominal tenderness.). There is no rebound and no guarding.  Musculoskeletal: Normal range of motion. He exhibits no edema and no tenderness.  No CVA tenderness.  Neurological: He is alert and oriented to person, place, and time.  Moves all extremities without deficit. Sensation grossly intact.  Skin: Skin is warm and dry. No rash noted. No erythema.  Psychiatric: He has a normal mood and affect. His behavior is normal.    ED Course  Procedures (including critical care time) Labs Review Labs Reviewed  CBC WITH DIFFERENTIAL  COMPREHENSIVE METABOLIC PANEL  LIPASE, BLOOD  URINALYSIS, ROUTINE W REFLEX MICROSCOPIC    Imaging Review No results found.   EKG Interpretation None      MDM   Final diagnoses:  None   We'll treat symptomatically and check abdominal labs. Given his multiple recent imaging studies I don't think that radiography is needed at this point. We'll monitor closely.   Patient appears to be much more comfortable. No vomiting currently.  Julianne Rice, MD 12/01/13 [phone]

## 2013-12-01 LAB — COMPREHENSIVE METABOLIC PANEL
ALBUMIN: 4.1 g/dL (ref 3.5–5.2)
ALK PHOS: 90 U/L (ref 39–117)
ALT: 86 U/L — ABNORMAL HIGH (ref 0–53)
AST: 45 U/L — ABNORMAL HIGH (ref 0–37)
BILIRUBIN TOTAL: 0.6 mg/dL (ref 0.3–1.2)
BUN: 10 mg/dL (ref 6–23)
CHLORIDE: 100 meq/L (ref 96–112)
CO2: 23 meq/L (ref 19–32)
CREATININE: 1.03 mg/dL (ref 0.50–1.35)
Calcium: 9.1 mg/dL (ref 8.4–10.5)
GFR calc Af Amer: >90 mL/min (ref >90)
GLUCOSE: 118 mg/dL — AB (ref 70–99)
POTASSIUM: 3.5 meq/L — AB (ref 3.7–5.3)
Sodium: 142 mEq/L (ref 137–147)
Total Protein: 8 g/dL (ref 6.0–8.3)

## 2013-12-01 LAB — CBC WITH DIFFERENTIAL/PLATELET
BASOS PCT: 0 % (ref 0–1)
Basophils Absolute: 0 10*3/uL (ref 0.0–0.1)
Eosinophils Absolute: 0.1 10*3/uL (ref 0.0–0.7)
Eosinophils Relative: 2 % (ref 0–5)
HEMATOCRIT: 49.2 % (ref 39.0–52.0)
HEMOGLOBIN: 18.1 g/dL — AB (ref 13.0–17.0)
LYMPHS ABS: 3 10*3/uL (ref 0.7–4.0)
LYMPHS PCT: 35 % (ref 12–46)
MCH: 34.4 pg — ABNORMAL HIGH (ref 26.0–34.0)
MCHC: 36.8 g/dL — ABNORMAL HIGH (ref 30.0–36.0)
MCV: 93.5 fL (ref 78.0–100.0)
MONO ABS: 0.6 10*3/uL (ref 0.1–1.0)
Monocytes Relative: 7 % (ref 3–12)
NEUTROS ABS: 4.8 10*3/uL (ref 1.7–7.7)
NEUTROS PCT: 56 % (ref 43–77)
Platelets: 301 10*3/uL (ref 150–400)
RBC: 5.26 MIL/uL (ref 4.22–5.81)
RDW: 14.8 % (ref 11.5–15.5)
WBC: 8.5 10*3/uL (ref 4.0–10.5)

## 2013-12-01 LAB — URINALYSIS, ROUTINE W REFLEX MICROSCOPIC
Bilirubin Urine: NEGATIVE
Glucose, UA: NEGATIVE mg/dL
Hgb urine dipstick: NEGATIVE
KETONES UR: NEGATIVE mg/dL
Leukocytes, UA: NEGATIVE
NITRITE: NEGATIVE
PH: 6.5 (ref 5.0–8.0)
PROTEIN: 30 mg/dL — AB
Specific Gravity, Urine: 1.022 (ref 1.005–1.030)
Urobilinogen, UA: 1 mg/dL (ref 0.0–1.0)

## 2013-12-01 LAB — URINE MICROSCOPIC-ADD ON

## 2013-12-01 LAB — LIPASE, BLOOD: LIPASE: 13 U/L (ref 11–59)

## 2013-12-01 MED ORDER — SODIUM CHLORIDE 0.9 % IV BOLUS (SEPSIS)
1000.0000 mL | Freq: Once | INTRAVENOUS | Status: AC
  Administered 2013-12-01: 1000 mL via INTRAVENOUS

## 2013-12-01 MED ORDER — OXYCODONE-ACETAMINOPHEN 5-325 MG PO TABS: 1.0000 | ORAL_TABLET | ORAL | Status: AC | PRN

## 2013-12-01 MED ORDER — HYDROMORPHONE HCL PF 1 MG/ML IJ SOLN
1.0000 mg | Freq: Once | INTRAMUSCULAR | Status: AC
  Administered 2013-12-01: 1 mg via INTRAVENOUS
  Filled 2013-12-01: qty 1

## 2013-12-01 MED ORDER — SODIUM CHLORIDE 0.9 % IV SOLN
Freq: Once | INTRAVENOUS | Status: AC
  Administered 2013-12-01: 04:00:00 via INTRAVENOUS

## 2013-12-01 NOTE — ED Notes (Signed)
Bed: WA20 Expected date:  Expected time:  Means of arrival:  Comments: Hold for Rm 4

## 2013-12-01 NOTE — ED Notes (Signed)
Pt is unable to urinate at this time. 

## 2013-12-01 NOTE — Discharge Instructions (Signed)
Followup with your gastroenterologist. Return immediately for worsening pain, persistent vomiting, any concerns.  Abdominal Pain, Adult Many things can cause abdominal pain. Usually, abdominal pain is not caused by a disease and will improve without treatment. It can often be observed and treated at home. Your health care provider will do a physical exam and possibly order blood tests and X-rays to help determine the seriousness of your pain. However, in many cases, more time must pass before a clear cause of the pain can be found. Before that point, your health care provider may not know if you need more testing or further treatment. HOME CARE INSTRUCTIONS  Monitor your abdominal pain for any changes. The following actions may help to alleviate any discomfort you are experiencing:  Only take over-the-counter or prescription medicines as directed by your health care provider.  Do not take laxatives unless directed to do so by your health care provider.  Try a clear liquid diet (broth, tea, or water) as directed by your health care provider. Slowly move to a bland diet as tolerated. SEEK MEDICAL CARE IF:  You have unexplained abdominal pain.  You have abdominal pain associated with nausea or diarrhea.  You have pain when you urinate or have a bowel movement.  You experience abdominal pain that wakes you in the night.  You have abdominal pain that is worsened or improved by eating food.  You have abdominal pain that is worsened with eating fatty foods. SEEK IMMEDIATE MEDICAL CARE IF:   Your pain does not go away within 2 hours.  You have a fever.  You keep throwing up (vomiting).  Your pain is felt only in portions of the abdomen, such as the right side or the left lower portion of the abdomen.  You pass bloody or black tarry stools. MAKE SURE YOU:  Understand these instructions.   Will watch your condition.   Will get help right away if you are not doing well or get worse.   Document Released: 05/12/2005 Document Revised: 05/23/2013 Document Reviewed: 04/11/2013 [facility] Patient Information 2014 [place].

## 2013-12-04 ENCOUNTER — Telehealth: Payer: Self-pay | Admitting: Internal Medicine

## 2013-12-04 ENCOUNTER — Ambulatory Visit: Payer: No Typology Code available for payment source | Admitting: Internal Medicine

## 2013-12-04 NOTE — Telephone Encounter (Signed)
HIDA rescheduled for 12/11/13 7:30 at [facility].  He is advised to hold all medications for 12 hours prior and be NPO after midnight

## 2013-12-07 ENCOUNTER — Encounter (HOSPITAL_COMMUNITY): Payer: Self-pay | Admitting: Emergency Medicine

## 2013-12-07 ENCOUNTER — Emergency Department (HOSPITAL_COMMUNITY)
Admission: EM | Admit: 2013-12-07 | Discharge: 2013-12-07 | Disposition: A | Discharge status: Home / Self Care | Payer: No Typology Code available for payment source | Attending: Emergency Medicine | Admitting: Emergency Medicine

## 2013-12-07 DIAGNOSIS — G8929 Other chronic pain: Secondary | ICD-10-CM | POA: Insufficient documentation

## 2013-12-07 DIAGNOSIS — K219 Gastro-esophageal reflux disease without esophagitis: Secondary | ICD-10-CM | POA: Insufficient documentation

## 2013-12-07 DIAGNOSIS — Z862 Personal history of diseases of the blood and blood-forming organs and certain disorders involving the immune mechanism: Secondary | ICD-10-CM | POA: Insufficient documentation

## 2013-12-07 DIAGNOSIS — R000 Tachycardia, unspecified: Secondary | ICD-10-CM | POA: Insufficient documentation

## 2013-12-07 DIAGNOSIS — R197 Diarrhea, unspecified: Secondary | ICD-10-CM | POA: Insufficient documentation

## 2013-12-07 DIAGNOSIS — Z8639 Personal history of other endocrine, nutritional and metabolic disease: Secondary | ICD-10-CM | POA: Insufficient documentation

## 2013-12-07 DIAGNOSIS — R109 Unspecified abdominal pain: Secondary | ICD-10-CM

## 2013-12-07 DIAGNOSIS — Z79899 Other long term (current) drug therapy: Secondary | ICD-10-CM | POA: Insufficient documentation

## 2013-12-07 DIAGNOSIS — Z9089 Acquired absence of other organs: Secondary | ICD-10-CM | POA: Insufficient documentation

## 2013-12-07 DIAGNOSIS — R0682 Tachypnea, not elsewhere classified: Secondary | ICD-10-CM | POA: Insufficient documentation

## 2013-12-07 DIAGNOSIS — R112 Nausea with vomiting, unspecified: Secondary | ICD-10-CM | POA: Insufficient documentation

## 2013-12-07 DIAGNOSIS — I10 Essential (primary) hypertension: Secondary | ICD-10-CM | POA: Insufficient documentation

## 2013-12-07 DIAGNOSIS — R1013 Epigastric pain: Secondary | ICD-10-CM | POA: Insufficient documentation

## 2013-12-07 DIAGNOSIS — Z8659 Personal history of other mental and behavioral disorders: Secondary | ICD-10-CM | POA: Insufficient documentation

## 2013-12-07 DIAGNOSIS — F17200 Nicotine dependence, unspecified, uncomplicated: Secondary | ICD-10-CM | POA: Insufficient documentation

## 2013-12-07 LAB — URINALYSIS, ROUTINE W REFLEX MICROSCOPIC
Glucose, UA: NEGATIVE mg/dL
Hgb urine dipstick: NEGATIVE
KETONES UR: NEGATIVE mg/dL
Leukocytes, UA: NEGATIVE
Nitrite: NEGATIVE
Protein, ur: 30 mg/dL — AB
Specific Gravity, Urine: 1.022 (ref 1.005–1.030)
Urobilinogen, UA: 1 mg/dL (ref 0.0–1.0)
pH: 5.5 (ref 5.0–8.0)

## 2013-12-07 LAB — COMPREHENSIVE METABOLIC PANEL
ALK PHOS: 100 U/L (ref 39–117)
ALT: 90 U/L — ABNORMAL HIGH (ref 0–53)
AST: 65 U/L — ABNORMAL HIGH (ref 0–37)
Albumin: 4.4 g/dL (ref 3.5–5.2)
BILIRUBIN TOTAL: 0.6 mg/dL (ref 0.3–1.2)
BUN: 6 mg/dL (ref 6–23)
CHLORIDE: 99 meq/L (ref 96–112)
CO2: 18 meq/L — AB (ref 19–32)
Calcium: 9.3 mg/dL (ref 8.4–10.5)
Creatinine, Ser: 1.07 mg/dL (ref 0.50–1.35)
GFR calc Af Amer: >90 mL/min (ref >90)
GFR calc non Af Amer: >90 mL/min (ref >90)
GLUCOSE: 125 mg/dL — AB (ref 70–99)
Potassium: 3.6 mEq/L — ABNORMAL LOW (ref 3.7–5.3)
Sodium: 141 mEq/L (ref 137–147)
Total Protein: 8.4 g/dL — ABNORMAL HIGH (ref 6.0–8.3)

## 2013-12-07 LAB — CBC WITH DIFFERENTIAL/PLATELET
Basophils Absolute: 0 10*3/uL (ref 0.0–0.1)
Basophils Relative: 0 % (ref 0–1)
Eosinophils Absolute: 0.1 10*3/uL (ref 0.0–0.7)
Eosinophils Relative: 1 % (ref 0–5)
HCT: 49 % (ref 39.0–52.0)
HEMOGLOBIN: 17.3 g/dL — AB (ref 13.0–17.0)
Lymphocytes Relative: 17 % (ref 12–46)
Lymphs Abs: 1.3 10*3/uL (ref 0.7–4.0)
MCH: 33.3 pg (ref 26.0–34.0)
MCHC: 35.3 g/dL (ref 30.0–36.0)
MCV: 94.4 fL (ref 78.0–100.0)
Monocytes Absolute: 0.3 10*3/uL (ref 0.1–1.0)
Monocytes Relative: 4 % (ref 3–12)
NEUTROS ABS: 6 10*3/uL (ref 1.7–7.7)
Neutrophils Relative %: 78 % — ABNORMAL HIGH (ref 43–77)
Platelets: 336 10*3/uL (ref 150–400)
RBC: 5.19 MIL/uL (ref 4.22–5.81)
RDW: 15 % (ref 11.5–15.5)
WBC: 7.7 10*3/uL (ref 4.0–10.5)

## 2013-12-07 LAB — URINE MICROSCOPIC-ADD ON

## 2013-12-07 LAB — LIPASE, BLOOD: LIPASE: 14 U/L (ref 11–59)

## 2013-12-07 MED ORDER — ONDANSETRON HCL 4 MG/2ML IJ SOLN
4.0000 mg | Freq: Once | INTRAMUSCULAR | Status: DC
Start: 2013-12-07 — End: 2013-12-07

## 2013-12-07 MED ORDER — SODIUM CHLORIDE 0.9 % IV BOLUS (SEPSIS)
2000.0000 mL | Freq: Once | INTRAVENOUS | Status: AC
  Administered 2013-12-07: 2000 mL via INTRAVENOUS

## 2013-12-07 MED ORDER — OXYCODONE HCL 5 MG PO TABS
5.0000 mg | ORAL_TABLET | ORAL | Status: DC | PRN
  Administered 2013-12-07: 5 mg via ORAL
  Filled 2013-12-07: qty 1

## 2013-12-07 MED ORDER — OXYCODONE HCL 5 MG PO TABS: 5.0000 mg | ORAL_TABLET | ORAL | Status: AC | PRN

## 2013-12-07 MED ORDER — FENTANYL CITRATE 0.05 MG/ML IJ SOLN
100.0000 ug | Freq: Once | INTRAMUSCULAR | Status: AC
  Administered 2013-12-07: 100 ug via INTRAVENOUS
  Filled 2013-12-07: qty 2

## 2013-12-07 MED ORDER — METOCLOPRAMIDE HCL 5 MG/ML IJ SOLN
10.0000 mg | Freq: Once | INTRAMUSCULAR | Status: AC
  Administered 2013-12-07: 10 mg via INTRAVENOUS
  Filled 2013-12-07: qty 2

## 2013-12-07 MED ORDER — HYDROMORPHONE HCL PF 1 MG/ML IJ SOLN
1.0000 mg | Freq: Once | INTRAMUSCULAR | Status: AC
  Administered 2013-12-07: 1 mg via INTRAVENOUS
  Filled 2013-12-07: qty 1

## 2013-12-07 MED ORDER — PROMETHAZINE HCL 25 MG/ML IJ SOLN
12.5000 mg | Freq: Once | INTRAMUSCULAR | Status: AC
  Administered 2013-12-07: 12.5 mg via INTRAVENOUS
  Filled 2013-12-07: qty 1

## 2013-12-07 NOTE — ED Provider Notes (Signed)
CSN: [id number]     Arrival date & time 12/07/13  1247 History   First MD Initiated Contact with Patient 12/07/13 1315     Chief Complaint  Patient presents with  . Abdominal Pain     (Consider location/radiation/quality/duration/timing/severity/associated sxs/prior Treatment) HPI Richard House is a 2 yom with chronic epigastric pain which began "before December of 2014" who is followed by GI with a scheduled HIDA scan on 4/28, and previous w/u of MRCP, CT [place] upper GI which were negative who presents to the Emergency Department today for epigastric pain and vomiting x4 days. Pt describes his pain as a sharp, cramping pain and reports having "yellow-colored, greasy diarrhea".  Pt was seen in [facility] ED and received tx with IV medication and fluids which temporarily improved his s/s until Tuesday when they returned and continue to progress in severity.  Pt denies recent use of alcohol.    Past Medical History  Diagnosis Date  . Hypertension   . Vertigo   . Pancreatitis, acute 08/14/2013  . Ulcer     gastric uler  . GERD (gastroesophageal reflux disease)   . Hyperlipidemia     no meds  . Substance abuse     tx of methadon  . Anxiety     panic attack while having a MRI   Past Surgical History  Procedure Laterality Date  . Appendectomy     Family History  Problem Relation Age of Onset  . High blood pressure Mother   . Colon polyps Mother   . High blood pressure Father   . Migraines Paternal Grandmother   . Colon cancer Neg Hx   . Esophageal cancer Neg Hx   . Prostate cancer Neg Hx   . Pancreatic cancer Neg Hx   . Rectal cancer Neg Hx   . Stomach cancer Neg Hx    History  Substance Use Topics  . Smoking status: Current Every Day Smoker -- 1.00 packs/day for .5 years    Types: Cigarettes  . Smokeless tobacco: Never Used  . Alcohol Use: No    Review of Systems  Gastrointestinal: Positive for nausea, vomiting, abdominal pain and diarrhea. Negative for constipation, blood in  stool and anal bleeding.  All other systems reviewed and are negative.  All other systems negative except as documented in the HPI. All pertinent positives and negatives as reviewed in the HPI.    Allergies  Tylenol and Zofran  Home Medications   Prior to Admission medications   Medication Sig Start Date End Date Taking? Authorizing Provider  oxyCODONE-acetaminophen (PERCOCET) 5-325 MG per tablet Take 1 tablet by mouth every 4 (four) hours as needed for severe pain. 12/01/13  Yes Julianne Rice, MD  pantoprazole (PROTONIX) 40 MG tablet Take 1 tablet (40 mg total) by mouth 2 (two) times daily. 11/12/13  Yes Reyne Dumas, MD  promethazine (PHENERGAN) 25 MG suppository Place 1 suppository (25 mg total) rectally every 6 (six) hours as needed for nausea or vomiting. 11/22/13  Yes Carlisle Cater, PA-C  sucralfate (CARAFATE) 1 G tablet Take 1 g by mouth 2 (two) times daily.   Yes Historical Provider, MD   BP 150/91  Pulse 144  Temp(Src) 99 F (37.2 C) (Oral)  Resp 18  SpO2 97% Physical Exam  Constitutional: He appears well-developed and well-nourished. No distress.  HENT:  Head: Normocephalic.  Eyes: No scleral icterus.  Cardiovascular: Regular rhythm and S1 normal.  Tachycardia present.  Exam reveals no gallop.   No murmur heard. Pulses:  Radial pulses are 2+ on the right side, and 2+ on the left side.  Pulmonary/Chest: Breath sounds normal. No accessory muscle usage. Tachypnea noted. No respiratory distress.  Abdominal: Normal appearance and bowel sounds are normal. There is tenderness in the epigastric area.  Violaceous striae noted across lower abdomen.    Skin: Skin is warm and dry. He is not diaphoretic. No pallor.  Psychiatric: He has a normal mood and affect.    ED Course  Procedures (including critical care time) Labs Review Labs Reviewed  CBC WITH DIFFERENTIAL - Abnormal; Notable for the following:    Hemoglobin 17.3 (*)    Neutrophils Relative % 78 (*)    All other  components within normal limits  COMPREHENSIVE METABOLIC PANEL - Abnormal; Notable for the following:    Potassium 3.6 (*)    CO2 18 (*)    Glucose, Bld 125 (*)    Total Protein 8.4 (*)    AST 65 (*)    ALT 90 (*)    All other components within normal limits  LIPASE, BLOOD  URINALYSIS, ROUTINE W REFLEX MICROSCOPIC       Tatyana Kirichenko PA-C will observe the patient and follow up on his labs. Patient is advised of the plan and voiced an understanding.  Brent General, PA-C 12/09/13 1735

## 2013-12-07 NOTE — Discharge Instructions (Signed)
Drink plenty of fluids. Oxycodone for pain. Follow up with your primary care doctor and for hida scan as scheduled.    Abdominal Pain, Adult Many things can cause abdominal pain. Usually, abdominal pain is not caused by a disease and will improve without treatment. It can often be observed and treated at home. Your health care provider will do a physical exam and possibly order blood tests and X-rays to help determine the seriousness of your pain. However, in many cases, more time must pass before a clear cause of the pain can be found. Before that point, your health care provider may not know if you need more testing or further treatment. HOME CARE INSTRUCTIONS  Monitor your abdominal pain for any changes. The following actions may help to alleviate any discomfort you are experiencing:  Only take over-the-counter or prescription medicines as directed by your health care provider.  Do not take laxatives unless directed to do so by your health care provider.  Try a clear liquid diet (broth, tea, or water) as directed by your health care provider. Slowly move to a bland diet as tolerated. SEEK MEDICAL CARE IF:  You have unexplained abdominal pain.  You have abdominal pain associated with nausea or diarrhea.  You have pain when you urinate or have a bowel movement.  You experience abdominal pain that wakes you in the night.  You have abdominal pain that is worsened or improved by eating food.  You have abdominal pain that is worsened with eating fatty foods. SEEK IMMEDIATE MEDICAL CARE IF:   Your pain does not go away within 2 hours.  You have a fever.  You keep throwing up (vomiting).  Your pain is felt only in portions of the abdomen, such as the right side or the left lower portion of the abdomen.  You pass bloody or black tarry stools. MAKE SURE YOU:  Understand these instructions.   Will watch your condition.   Will get help right away if you are not doing well or get  worse.  Document Released: 05/12/2005 Document Revised: 05/23/2013 Document Reviewed: 04/11/2013 [facility] Patient Information 2014 [place].

## 2013-12-07 NOTE — ED Provider Notes (Signed)
Pt with hx of chronic abdominal pain, pancreatitis. Presents today to ED with complaint of abdominal pain. Pt has follow up scheduled with hida scan to be done in 3 days. Pt is here for pain management. He was seen by [facility] Lawyer and signed out pending 2L fluid bolus. Will reassess after bolulses  Pt had his fluids. He is feeling better. Asking for pain medications at home. Oxycodone prescribed 15 tabs. Pt instructed to continue to drink fluids at home. He is tolerating fluids by mouth here in emergency department. He is instructed to followup closely with his Dr.  Danley Danker Vitals:   12/07/13 1257 12/07/13 1531 12/07/13 1722 12/07/13 1852  BP: 150/91 144/96 134/89 122/92  Pulse: 144 118 116 76  Temp: 99 F (37.2 C) 98.2 F (36.8 C) 98 F (36.7 C) 98.2 F (36.8 C)  TempSrc: Oral Oral Oral Oral  Resp: 18 18 18 16   SpO2: 97% 98% 100% 99%     Renold Genta, PA-C 12/07/13 2326

## 2013-12-07 NOTE — Progress Notes (Signed)
[facility] spoke with patient about community resources. Patient [employer] [employer] Card expired with [employer] [facility]. Patient stated that he was working on re-enrolling and had application and contact information. Patient declined CL offer to schedule a enrollment apt or follow-up at this time.

## 2013-12-07 NOTE — ED Notes (Signed)
Patient aware for the need of a urine sample but is unable to provide at this time.

## 2013-12-07 NOTE — ED Notes (Addendum)
Pt reports abdominal pain and vomiting x11 months. Has had CT, MRI, xrays and blood work done for complaint. Was seen in ED 4/9 for same. Actively vomiting at time. Has not eaten in 2 days. Abdominal pain 10/10. Denies living in old house, does not work, not around gases, toxins, or paint.  Gets headaches with zofran.   No ETOH since December 2014

## 2013-12-07 NOTE — ED Provider Notes (Signed)
Medical screening examination/treatment/procedure(s) were performed by non-physician practitioner and as supervising physician I was immediately available for consultation/collaboration.   EKG Interpretation None       Merryl Hacker, MD 12/07/13 2348

## 2013-12-10 NOTE — ED Provider Notes (Signed)
Medical screening examination/treatment/procedure(s) were performed by non-physician practitioner and as supervising physician I was immediately available for consultation/collaboration.   EKG Interpretation None        Orpah Greek, MD 12/10/13 [phone]

## 2013-12-11 ENCOUNTER — Ambulatory Visit (HOSPITAL_COMMUNITY)
Admission: RE | Admit: 2013-12-11 | Discharge: 2013-12-11 | Disposition: A | Discharge status: Home / Self Care | Payer: No Typology Code available for payment source | Source: Ambulatory Visit | Attending: Nurse Practitioner | Admitting: Nurse Practitioner

## 2013-12-11 DIAGNOSIS — G8929 Other chronic pain: Secondary | ICD-10-CM

## 2013-12-11 DIAGNOSIS — R112 Nausea with vomiting, unspecified: Secondary | ICD-10-CM

## 2013-12-11 DIAGNOSIS — R1011 Right upper quadrant pain: Secondary | ICD-10-CM | POA: Insufficient documentation

## 2013-12-11 MED ORDER — TECHNETIUM TC 99M MEBROFENIN IV KIT
5.0000 | PACK | Freq: Once | INTRAVENOUS | Status: AC | PRN
  Administered 2013-12-11: 5 via INTRAVENOUS

## 2014-03-21 NOTE — Telephone Encounter (Signed)
Noted  

## 2015-02-14 DEATH — deceased

## 2015-02-22 IMAGING — CR DG CHEST 1V PORT
1 series · 1 of 1 positions shown · non-contrast
Comparison: None.

CLINICAL DATA: GI bleed.  Rule out free air

EXAM:
PORTABLE CHEST - 1 VIEW

[AP]
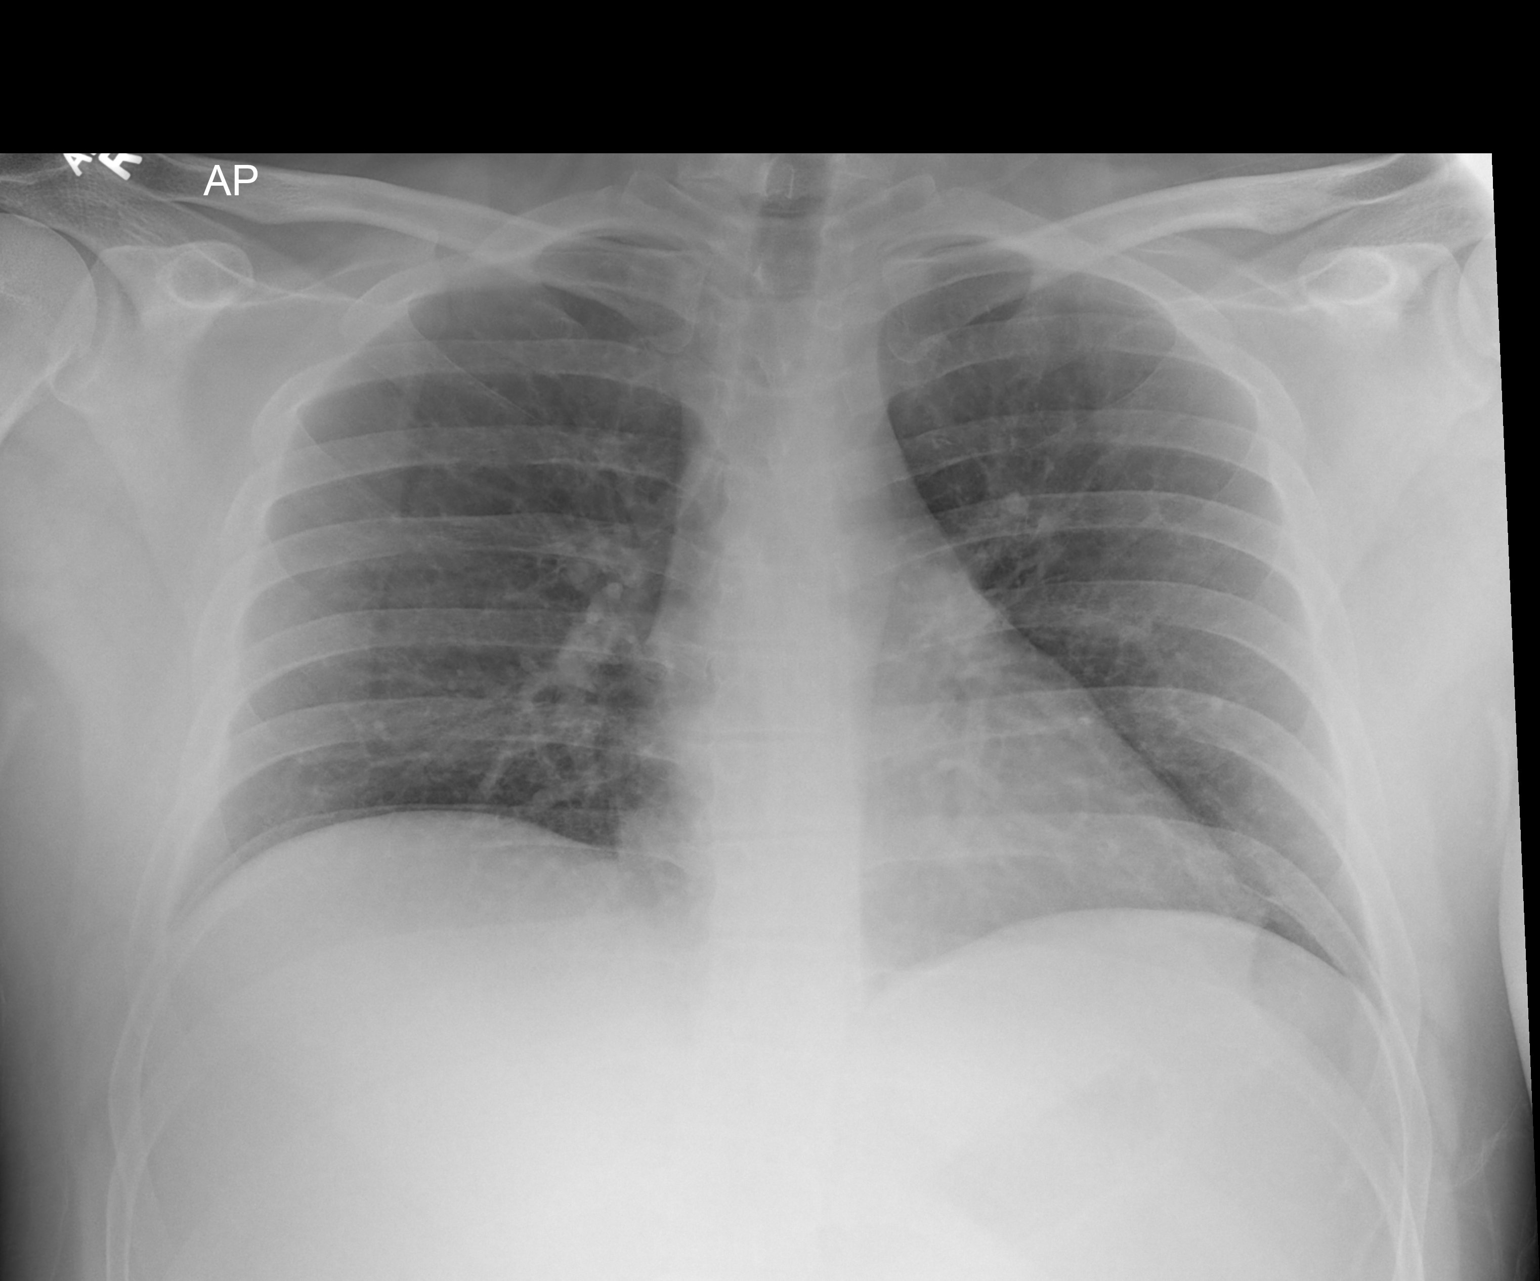

[1 of 1 positions shown; findings below may reference images not displayed]

FINDINGS: The heart size is normal and the vascularity is normal. Lungs are
clear. No free air under the diaphragm.
IMPRESSION: No active disease.

## 2015-04-06 IMAGING — CT CT ABD-PELV W/ CM
1 of 2 series · 15 of 32 positions shown, 19 images · IV contrast (omnipaque)
Comparison: 08/13/2013

CLINICAL DATA: Emesis for over 1 month. Indented for pancreatitis
last month.

EXAM:
CT ABDOMEN AND PELVIS WITH CONTRAST
TECHNIQUE: Multidetector CT imaging of the abdomen and pelvis was performed
using the standard protocol following bolus administration of
intravenous contrast.
CONTRAST:  100mL OMNIPAQUE IOHEXOL 300 MG/ML  SOLN

[Series 2: abd/pel with · axial · 0.79mm/px · z∈[-459,+16]mm · 15 of 105 slices shown, 19 images]
[im 5/105  soft-tissue]
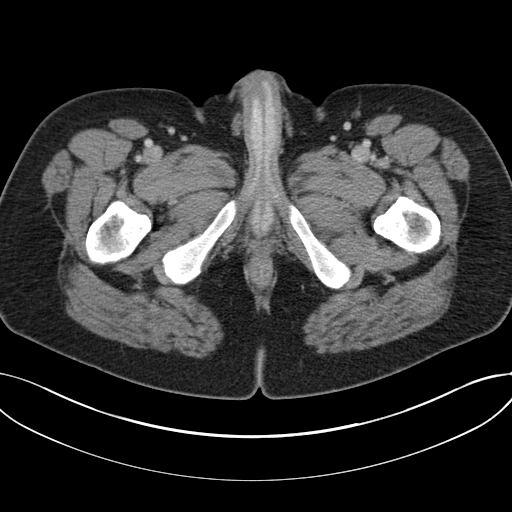
[im 5/105  bone]
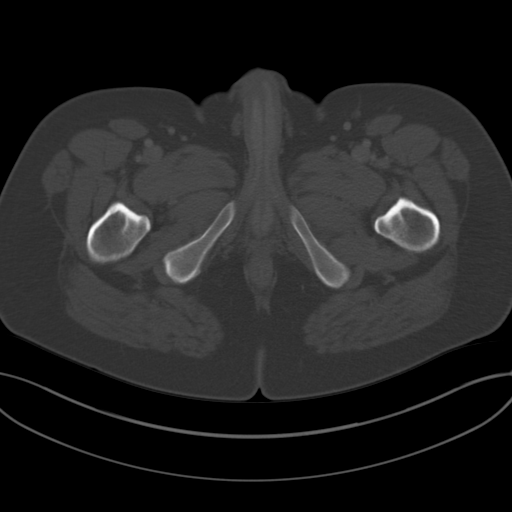
[im 14/105  soft-tissue]
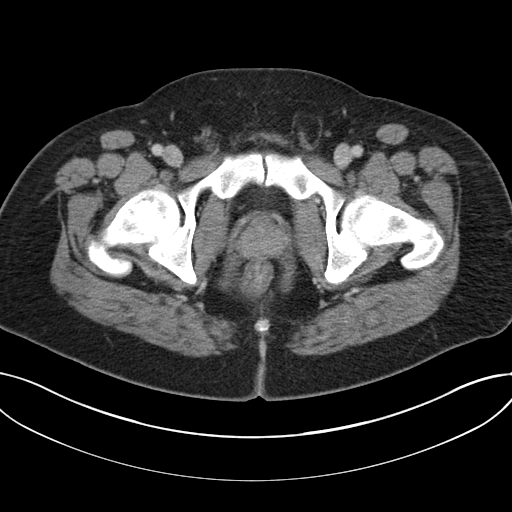
[im 22/105  soft-tissue]
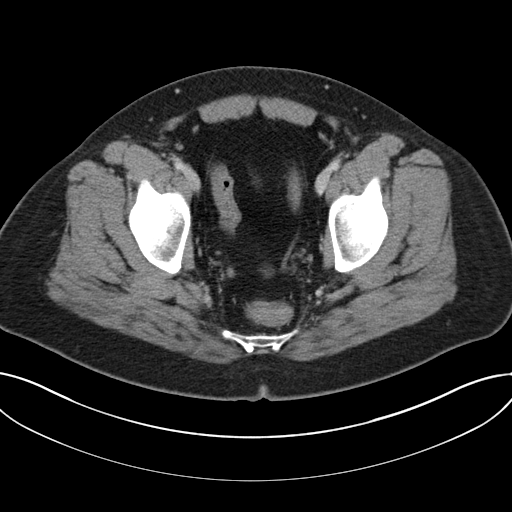
[im 31/105  soft-tissue]
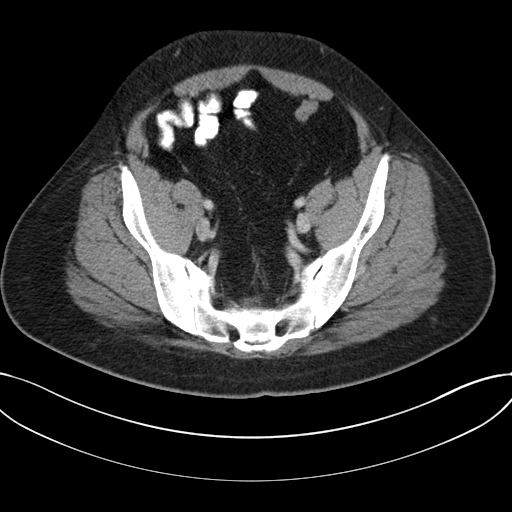
[im 35/105  soft-tissue]
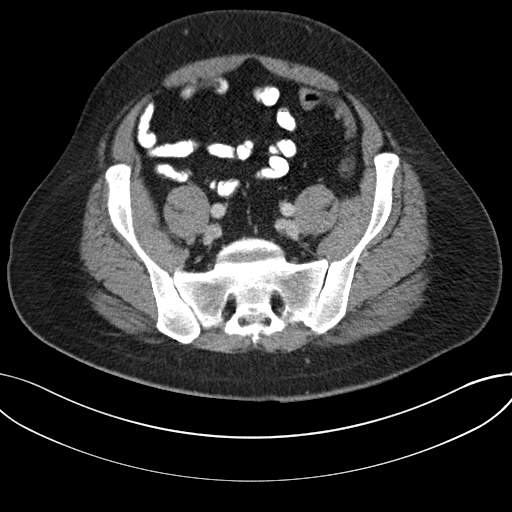
[im 44/105  soft-tissue]
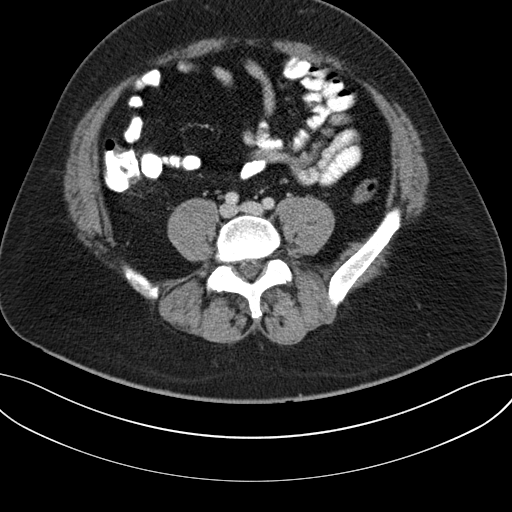
[im 53/105  soft-tissue]
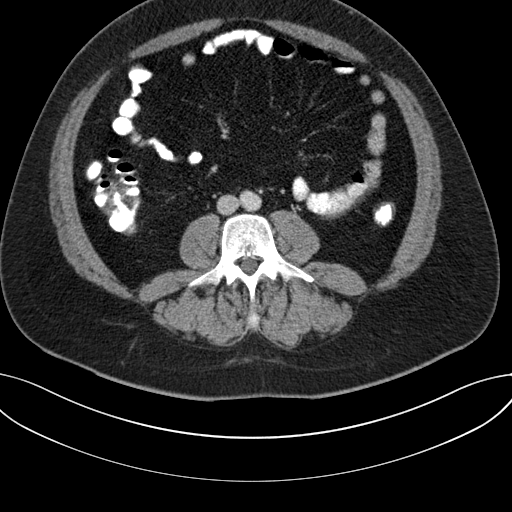
[im 61/105  soft-tissue]
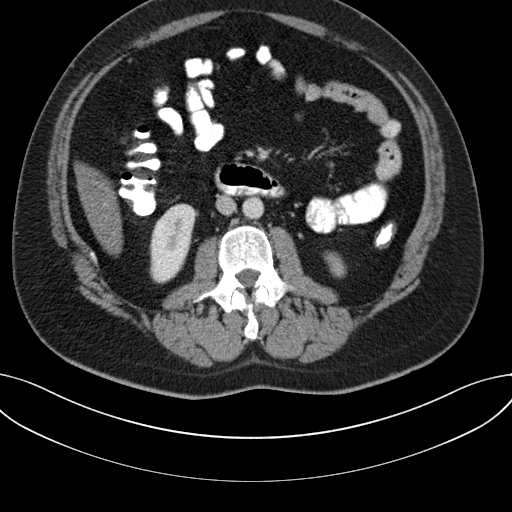
[im 70/105  soft-tissue]
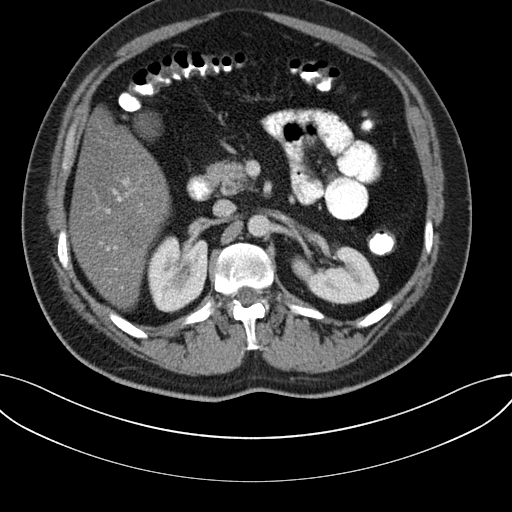
[im 70/105  bone]
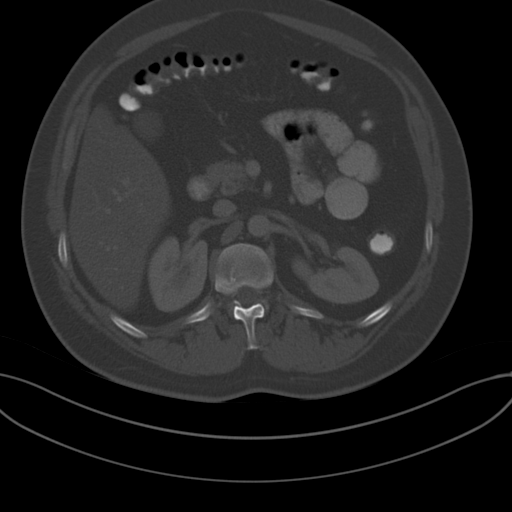
[im 74/105  soft-tissue]
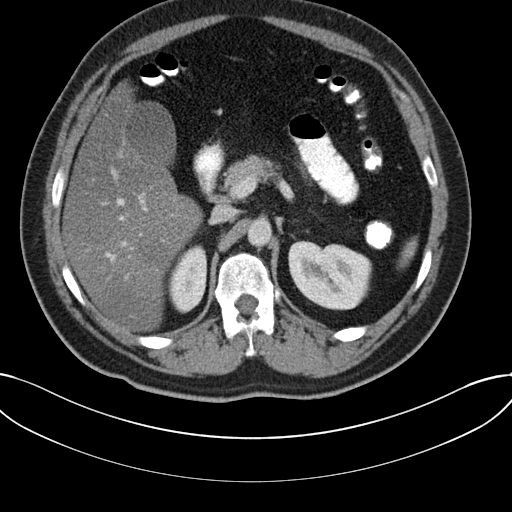
[im 83/105  soft-tissue]
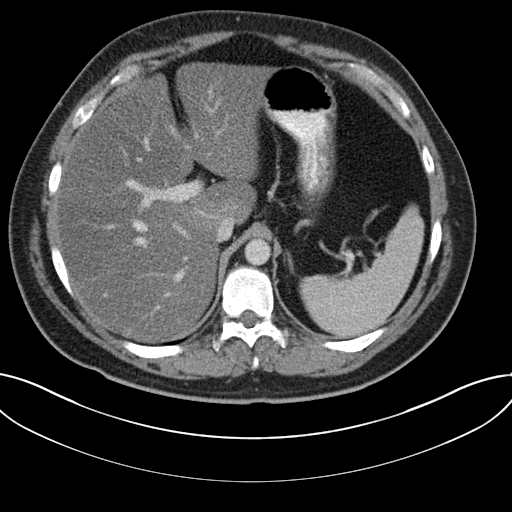
[im 87/105  lung]
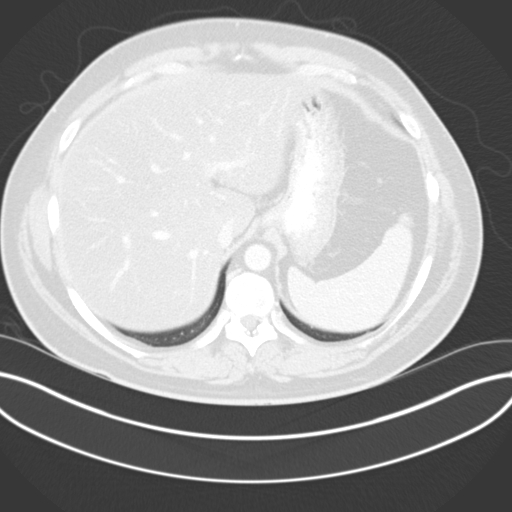
[im 92/105  soft-tissue]
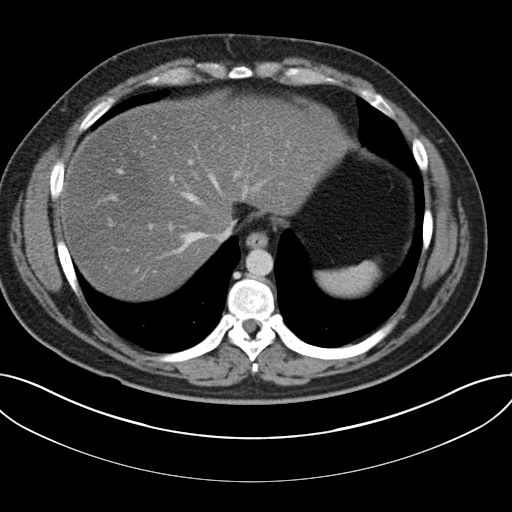
[im 92/105  lung]
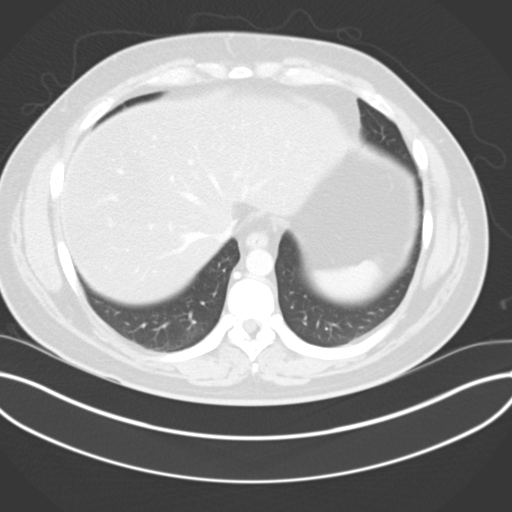
[im 96/105  lung]
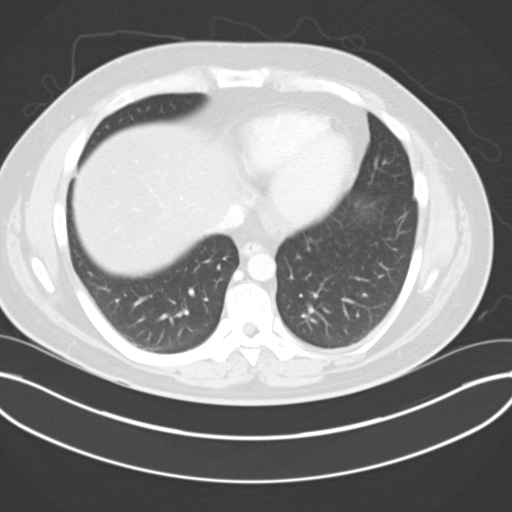
[im 100/105  soft-tissue]
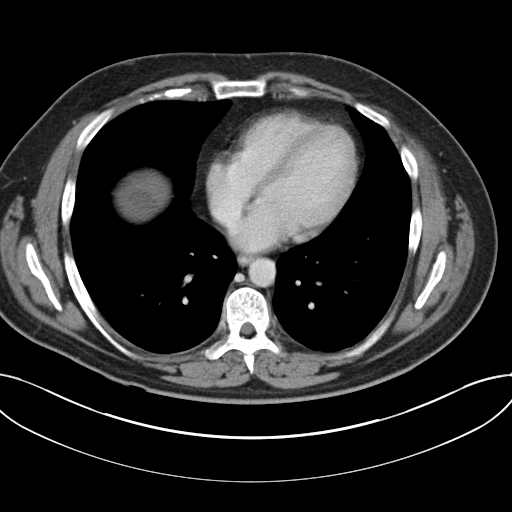
[im 100/105  lung]
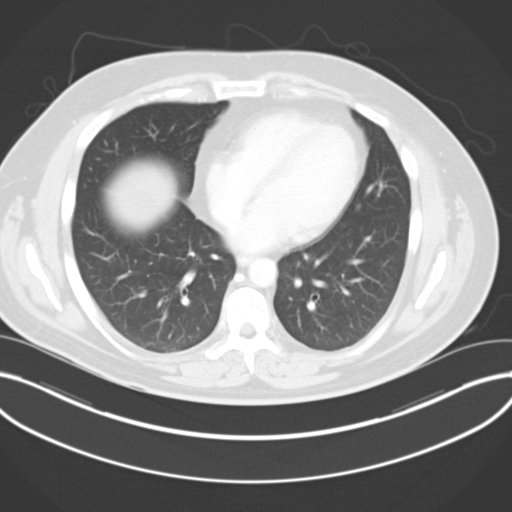

[15 of 32 positions shown; findings below may reference images not displayed]

FINDINGS: Liver is mildly enlarged. There is extensive fatty infiltration. No
liver mass or focal lesion.

Pancreas is unremarkable.  There is no evidence of pancreatitis.

Normal spleen and gallbladder. No bile duct dilation. Normal adrenal
glands. Normal kidneys, ureters and bladder.

No pathologically enlarged lymph nodes. There are no abnormal fluid
collections.

The stomach is unremarkable. Normal small bowel. Normal colon.
Appendix is not visualized.

No significant bony abnormality.
IMPRESSION: 1. No acute findings.  No evidence of pancreatitis.
2. Mild hepatomegaly and extensive hepatic steatosis, unchanged.
3. Exam otherwise unremarkable.

## 2017-05-10 NOTE — Telephone Encounter (Signed)
No additional note
